# Patient Record
Sex: Male | Born: 1955 | Race: White | Hispanic: No | Smoking: Never smoker
Health system: Southern US, Community
[De-identification: ages and names within clinical notes are randomized; demographics above are authoritative.]

## PROBLEM LIST (undated history)

## (undated) DIAGNOSIS — F419 Anxiety disorder, unspecified: Secondary | ICD-10-CM

## (undated) DIAGNOSIS — F32A Depression, unspecified: Secondary | ICD-10-CM

## (undated) DIAGNOSIS — Z86718 Personal history of other venous thrombosis and embolism: Secondary | ICD-10-CM

## (undated) DIAGNOSIS — G4730 Sleep apnea, unspecified: Secondary | ICD-10-CM

## (undated) DIAGNOSIS — I10 Essential (primary) hypertension: Secondary | ICD-10-CM

## (undated) HISTORY — PX: EYE SURGERY: SHX253

## (undated) HISTORY — PX: SKIN CANCER EXCISION: SHX779

---

## 2000-12-14 DIAGNOSIS — E785 Hyperlipidemia, unspecified: Secondary | ICD-10-CM | POA: Insufficient documentation

## 2008-11-03 DIAGNOSIS — G4730 Sleep apnea, unspecified: Secondary | ICD-10-CM | POA: Insufficient documentation

## 2008-11-03 DIAGNOSIS — E669 Obesity, unspecified: Secondary | ICD-10-CM | POA: Insufficient documentation

## 2008-11-03 DIAGNOSIS — R5383 Other fatigue: Secondary | ICD-10-CM | POA: Insufficient documentation

## 2008-11-03 DIAGNOSIS — L570 Actinic keratosis: Secondary | ICD-10-CM | POA: Insufficient documentation

## 2008-11-03 DIAGNOSIS — I868 Varicose veins of other specified sites: Secondary | ICD-10-CM | POA: Insufficient documentation

## 2008-11-03 DIAGNOSIS — R600 Localized edema: Secondary | ICD-10-CM | POA: Insufficient documentation

## 2008-11-03 DIAGNOSIS — I10 Essential (primary) hypertension: Secondary | ICD-10-CM | POA: Insufficient documentation

## 2009-04-14 DIAGNOSIS — G4700 Insomnia, unspecified: Secondary | ICD-10-CM | POA: Insufficient documentation

## 2009-04-14 DIAGNOSIS — F32A Depression, unspecified: Secondary | ICD-10-CM | POA: Insufficient documentation

## 2015-05-14 DIAGNOSIS — R072 Precordial pain: Secondary | ICD-10-CM | POA: Insufficient documentation

## 2019-08-21 ENCOUNTER — Encounter (HOSPITAL_COMMUNITY): Payer: Self-pay | Admitting: *Deleted

## 2019-08-21 ENCOUNTER — Inpatient Hospital Stay (HOSPITAL_COMMUNITY)
Admission: AD | Admit: 2019-08-21 | Discharge: 2019-08-25 | DRG: 885 | Disposition: A | Discharge status: Home / Self Care | Payer: 59 | Attending: Psychiatry | Admitting: Psychiatry

## 2019-08-21 ENCOUNTER — Other Ambulatory Visit: Payer: Self-pay | Admitting: Behavioral Health

## 2019-08-21 ENCOUNTER — Other Ambulatory Visit: Payer: Self-pay

## 2019-08-21 DIAGNOSIS — F333 Major depressive disorder, recurrent, severe with psychotic symptoms: Secondary | ICD-10-CM

## 2019-08-21 DIAGNOSIS — F3341 Major depressive disorder, recurrent, in partial remission: Secondary | ICD-10-CM | POA: Diagnosis present

## 2019-08-21 DIAGNOSIS — H9319 Tinnitus, unspecified ear: Secondary | ICD-10-CM | POA: Diagnosis present

## 2019-08-21 DIAGNOSIS — I10 Essential (primary) hypertension: Secondary | ICD-10-CM | POA: Diagnosis present

## 2019-08-21 DIAGNOSIS — R45851 Suicidal ideations: Secondary | ICD-10-CM | POA: Diagnosis present

## 2019-08-21 DIAGNOSIS — Z23 Encounter for immunization: Secondary | ICD-10-CM

## 2019-08-21 DIAGNOSIS — G4700 Insomnia, unspecified: Secondary | ICD-10-CM | POA: Diagnosis present

## 2019-08-21 DIAGNOSIS — Z20828 Contact with and (suspected) exposure to other viral communicable diseases: Secondary | ICD-10-CM | POA: Diagnosis present

## 2019-08-21 DIAGNOSIS — F331 Major depressive disorder, recurrent, moderate: Secondary | ICD-10-CM | POA: Diagnosis present

## 2019-08-21 DIAGNOSIS — F411 Generalized anxiety disorder: Secondary | ICD-10-CM | POA: Diagnosis present

## 2019-08-21 HISTORY — DX: Essential (primary) hypertension: I10

## 2019-08-21 LAB — SARS CORONAVIRUS 2 BY RT PCR (HOSPITAL ORDER, PERFORMED IN ~~LOC~~ HOSPITAL LAB): SARS Coronavirus 2: NEGATIVE

## 2019-08-21 MED ORDER — ACETAMINOPHEN 325 MG PO TABS
650.0000 mg | ORAL_TABLET | Freq: Four times a day (QID) | ORAL | Status: DC | PRN
  Administered 2019-08-21: 650 mg via ORAL
  Filled 2019-08-21: qty 2

## 2019-08-21 MED ORDER — TRAZODONE HCL 50 MG PO TABS
50.0000 mg | ORAL_TABLET | Freq: Every day | ORAL | Status: DC
  Administered 2019-08-21: 50 mg via ORAL
  Filled 2019-08-21 (×4): qty 1

## 2019-08-21 MED ORDER — ALUM & MAG HYDROXIDE-SIMETH 200-200-20 MG/5ML PO SUSP: 30.0000 mL | ORAL | Status: DC | PRN

## 2019-08-21 MED ORDER — PNEUMOCOCCAL VAC POLYVALENT 25 MCG/0.5ML IJ INJ
0.5000 mL | INJECTION | INTRAMUSCULAR | Status: AC
  Administered 2019-08-22: 0.5 mL via INTRAMUSCULAR

## 2019-08-21 NOTE — H&P (Signed)
Behavioral Health Medical Screening Exam  Kyle Mccarthy is an 63 y.o. male.who presented to [facility] as a walk-in, voluntarily, accompanied alone. He endorses worsening depression along with ongoing suicidal thoughts.  He describes a plan to, " overdose or kill myself by carbon monoxide." He states, " when you feel like all hope is gone, you don't matter for nothing, you feel emotionally numb, and every other thought is about the world being better without you, what do you do?" He reports he has been dealing with suicidal thoughts off and on for the past year although for the past month, his suicidal thoughts have worsened. When asked about triggers or stressors he replied, " its really hard to say." He reports he was diagnosed with depression a few weeks ago by his PCP.  He describes current depressive symptoms as hopelessness, worthlessness, isolation, emptiness, decreased sleep ("5 hours per night at best"), and decreased appetite. Reports anxiety described as excessive worry. Reports he was started on Citalopram by his PCP for depression although he stopped taking it a week and a half later due to side effects. Reports for the past 3 weeks, he has been drinking alcohol excessively (mostly beer but some liquor) although he had no prior history of alcohol use. He denies AVH or other psychosis as well as homicidals ideations.   In regard to additional  psychiatric history, the reports 3 prior suicide attempts with the most recent one a few weekends ago. He states, " I was out pushing the mower very fast and the entire time I was hoping to meet my widow maker." When asked to explain he stated, " a widow maker is when your heart stops or is cut off from the back and you just drop dead." He also reports he has attempted to kill himself by finagling carbon monoxide. He reports  he recently made a will, prepaid for funeral expenses, signed his truck title over to his son and gave his son his debit cards for  preparation of his death.  He denies previous inpatient or outpatient psychiatric care. He reports a significant family history of mental health illness on maternal and paternal side and alcoholism on maternal side. Reports his son has been psychiatrically hospitalized here at [facility].    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 Reports medical history as HTN and hypercholesterolemia.   Total Time spent with patient: 20 minutes  Psychiatric Specialty Exam: Physical Exam  Vitals reviewed. Constitutional: He is oriented to person, place, and time.  Neurological: He is alert and oriented to person, place, and time.    Review of Systems  Psychiatric/Behavioral: Positive for depression and suicidal ideas. Negative for hallucinations, memory loss and substance abuse. The patient is nervous/anxious and has insomnia.   All other systems reviewed and are negative.   Blood pressure 140/89, pulse 72, temperature 98.3 F (36.8 C), temperature source Oral, resp. rate 18, SpO2 100 %.There is no height or weight on file to calculate BMI.  General Appearance: Fairly Groomed  Eye Contact:  Good  Speech:  Clear and Coherent and Normal Rate  Volume:  Normal  Mood:  Anxious, Depressed, Hopeless and Worthless  Affect:  Depressed  Thought Process:  Coherent, Linear and Descriptions of Associations: Intact  Orientation:  Full (Time, Place, and Person)  Thought Content:  WDL  Suicidal Thoughts:  Yes.  with intent/plan  Homicidal Thoughts:  No  Memory:  Immediate;   Fair Recent;   Fair  Judgement:  Fair  Insight:  Fair  Psychomotor Activity:  Normal  Concentration: Concentration: Fair and Attention Span: Fair  Recall:  FiservFair  Fund of Knowledge:Fair  Language: Good  Akathisia:  Negative  Handed:  Right  AIMS (if indicated):     Assets:  Communication Skills Desire for Improvement Resilience Social Support  Sleep:       Musculoskeletal: Strength & Muscle Tone: within normal  limits Gait & Station: normal Patient leans: N/A  Blood pressure 140/89, pulse 72, temperature 98.3 F (36.8 C), temperature source Oral, resp. rate 18, SpO2 100 %.  Recommendations:  There is evidence of imminent risk to self at present.   Patient does meet criteria for psychiatric inpatient admission.     Denzil MagnusonLaShunda Adonias Demore, NP 08/21/2019, 10:36 AM

## 2019-08-21 NOTE — Progress Notes (Signed)
Patient ID: Kyle Mccarthy, male   DOB: 17-Dec-1956, 63 y.o.   MRN: [id number] Patient reported a decreased desire to live with worsening depression.   Patient stated he knew it was time for him to come get some help. Skin assessment complete.  Patient found to be free of all injury upon admission.  Patient admitted to the hall without incident.

## 2019-08-21 NOTE — BH Assessment (Signed)
Assessment Note  Kyle Mccarthy is an 63 y.o. male. Pt reports SI with multiple plans. Pt denies HI and AVH. Pt states he has been depressed most of his life and had SI throughout his life. Pt reports helplessness, hopelessness, and feeling as if no one cares. Pt reports 3 prior SI attempts. Pt denies previous or current MH treatment.   Tinnie Gens, NP recommends inpatient treatment. Pt accepted to [facility] unit per Nira Conn, RN.   Diagnosis:  F33.2 MDD  Past Medical History: No past medical history on file.   Family History: No family history on file.  Social History:  has no history on file for tobacco, alcohol, and drug.  Additional Social History:  Alcohol / Drug Use Pain Medications: please see mar Prescriptions: please see mar Over the Counter: please see mar History of alcohol / drug use?: No history of alcohol / drug abuse Longest period of sobriety (when/how long): unknown  CIWA: CIWA-Ar BP: 140/89 Pulse Rate: 72 COWS:    Allergies: Not on File  Home Medications: (Not in a hospital admission)   OB/GYN Status:  No LMP for male patient.  General Assessment Data TTS Assessment: In system Is this a Tele or Face-to-Face Assessment?: Face-to-Face Is this an Initial Assessment or a Re-assessment for this encounter?: Initial Assessment Patient Accompanied by:: N/A Language Other than English: No Living Arrangements: Other (Comment) What gender do you identify as?: Male Marital status: Single Maiden name: NA Pregnancy Status: No Living Arrangements: Alone Can pt return to current living arrangement?: Yes Admission Status: Voluntary Is patient capable of signing voluntary admission?: Yes Referral Source: Self/Family/Friend Insurance type: Nutritional therapist Exam ([place]) Medical Exam completed: Yes  Crisis Care Plan Living Arrangements: Alone Legal Guardian: Other:(self) Name of Psychiatrist: NA Name of Therapist: NA  Education Status Is  patient currently in school?: No Is the patient employed, unemployed or receiving disability?: Employed  Risk to self with the past 6 months Suicidal Ideation: Yes-Currently Present Has patient been a risk to self within the past 6 months prior to admission? : Yes Suicidal Intent: Yes-Currently Present Has patient had any suicidal intent within the past 6 months prior to admission? : Yes Is patient at risk for suicide?: Yes Suicidal Plan?: Yes-Currently Present Has patient had any suicidal plan within the past 6 months prior to admission? : Yes Specify Current Suicidal Plan: reports multiple Access to Means: Yes Specify Access to Suicidal Means: access to multiple What has been your use of drugs/alcohol within the last 12 months?: SA Previous Attempts/Gestures: Yes How many times?: 3 Other Self Harm Risks: NA Triggers for Past Attempts: None known Intentional Self Injurious Behavior: None Family Suicide History: No Recent stressful life event(s): Other (Comment)(MH) Persecutory voices/beliefs?: No Depression: Yes Depression Symptoms: Tearfulness, Isolating, Fatigue, Loss of interest in usual pleasures, Feeling worthless/self pity, Feeling angry/irritable, Guilt, Insomnia, Despondent Substance abuse history and/or treatment for substance abuse?: No Suicide prevention information given to non-admitted patients: Not applicable  Risk to Others within the past 6 months Homicidal Ideation: No Does patient have any lifetime risk of violence toward others beyond the six months prior to admission? : No Thoughts of Harm to Others: No Current Homicidal Intent: No Current Homicidal Plan: No Access to Homicidal Means: No Identified Victim: NA History of harm to others?: No Assessment of Violence: None Noted Violent Behavior Description: NA Does patient have access to weapons?: No Criminal Charges Pending?: No Does patient have a court date: No Is patient on  probation?:  No  Psychosis Hallucinations: None noted Delusions: None noted  Mental Status Report Appearance/Hygiene: Unremarkable Eye Contact: Fair Motor Activity: Freedom of movement Speech: Logical/coherent Level of Consciousness: Alert Mood: Depressed Affect: Depressed Anxiety Level: Minimal Thought Processes: Coherent, Relevant Judgement: Unimpaired Orientation: Person, Place, Time, Situation Obsessive Compulsive Thoughts/Behaviors: None  Cognitive Functioning Concentration: Normal Memory: Recent Intact, Remote Intact Is patient IDD: No Insight: Poor Impulse Control: Poor Appetite: Fair Have you had any weight changes? : No Change Sleep: Decreased Total Hours of Sleep: 5 Vegetative Symptoms: None  ADLScreening [facility] Assessment Services) Patient's cognitive ability adequate to safely complete daily activities?: Yes Patient able to express need for assistance with ADLs?: Yes  Prior Inpatient Therapy Prior Inpatient Therapy: No  Prior Outpatient Therapy Prior Outpatient Therapy: No Does patient have an ACCT team?: No Does patient have Intensive In-House Services?  : No Does patient have [facility] services? : No Does patient have P4CC services?: No  ADL Screening (condition at time of admission) Patient's cognitive ability adequate to safely complete daily activities?: Yes Is the patient deaf or have difficulty hearing?: No Does the patient have difficulty seeing, even when wearing glasses/contacts?: No Does the patient have difficulty concentrating, remembering, or making decisions?: No Patient able to express need for assistance with ADLs?: Yes Does the patient have difficulty dressing or bathing?: No       Abuse/Neglect Assessment (Assessment to be complete while patient is alone) Abuse/Neglect Assessment Can Be Completed: Yes Physical Abuse: Denies Verbal Abuse: Denies Sexual Abuse: Denies Exploitation of patient/patient's resources: Denies     Merchant navy officerAdvance Directives  (For Healthcare) Does Patient Have a Medical Advance Directive?: No Would patient like information on creating a medical advance directive?: No - Patient declined          Disposition:  Disposition Initial Assessment Completed for this Encounter: Yes Disposition of Patient: Admit  On Site Evaluation by:   Reviewed with Physician:    Emmit PomfretLevette,Kimberle Stanfill D 08/21/2019 12:31 PM

## 2019-08-21 NOTE — Tx Team (Signed)
Initial Treatment Plan 08/21/2019 3:17 PM Kyle Mccarthy ENM:[id number]    PATIENT STRESSORS: Marital or family conflict   PATIENT STRENGTHS: Ability for insight Active sense of humor Average or above average intelligence Capable of independent living Communication skills [place] of knowledge Motivation for treatment/growth Physical Health Supportive family/friends Work skills   PATIENT IDENTIFIED PROBLEMS: "I just dont want to live anymore                     DISCHARGE CRITERIA:  Improved stabilization in mood, thinking, and/or behavior  PRELIMINARY DISCHARGE PLAN: Return to previous living arrangement  PATIENT/FAMILY INVOLVEMENT: This treatment plan has been presented to and reviewed with the patient, Kyle Mccarthy, and/or family member,   The patient and family have been given the opportunity to ask questions and make suggestions.  Clarita Crane, RN 08/21/2019, 3:17 PM

## 2019-08-22 DIAGNOSIS — F333 Major depressive disorder, recurrent, severe with psychotic symptoms: Principal | ICD-10-CM

## 2019-08-22 LAB — COMPREHENSIVE METABOLIC PANEL
ALT: 11 U/L (ref 0–44)
AST: 13 U/L — ABNORMAL LOW (ref 15–41)
Albumin: 3.8 g/dL (ref 3.5–5.0)
Alkaline Phosphatase: 51 U/L (ref 38–126)
Anion gap: 9 (ref 5–15)
BUN: 11 mg/dL (ref 8–23)
CO2: 28 mmol/L (ref 22–32)
Calcium: 9.4 mg/dL (ref 8.9–10.3)
Chloride: 107 mmol/L (ref 98–111)
Creatinine, Ser: 1.12 mg/dL (ref 0.61–1.24)
GFR calc Af Amer: >60 mL/min (ref >60)
GFR calc non Af Amer: >60 mL/min (ref >60)
Glucose, Bld: 89 mg/dL (ref 70–99)
Potassium: 3.4 mmol/L — ABNORMAL LOW (ref 3.5–5.1)
Sodium: 144 mmol/L (ref 135–145)
Total Bilirubin: 0.8 mg/dL (ref 0.3–1.2)
Total Protein: 6.3 g/dL — ABNORMAL LOW (ref 6.5–8.1)

## 2019-08-22 LAB — TSH: TSH: 1.679 u[IU]/mL (ref 0.350–4.500)

## 2019-08-22 LAB — LIPID PANEL
Cholesterol: 136 mg/dL (ref 0–200)
HDL: 48 mg/dL (ref >40)
LDL Cholesterol: 68 mg/dL (ref 0–99)
Total CHOL/HDL Ratio: 2.8 RATIO
Triglycerides: 98 mg/dL (ref <150)
VLDL: 20 mg/dL (ref 0–40)

## 2019-08-22 LAB — CBC
HCT: 41.4 % (ref 39.0–52.0)
Hemoglobin: 13.5 g/dL (ref 13.0–17.0)
MCH: 31.6 pg (ref 26.0–34.0)
MCHC: 32.6 g/dL (ref 30.0–36.0)
MCV: 97 fL (ref 80.0–100.0)
Platelets: 133 10*3/uL — ABNORMAL LOW (ref 150–400)
RBC: 4.27 MIL/uL (ref 4.22–5.81)
RDW: 12.1 % (ref 11.5–15.5)
WBC: 4.5 10*3/uL (ref 4.0–10.5)
nRBC: 0 % (ref 0.0–0.2)

## 2019-08-22 LAB — RAPID URINE DRUG SCREEN, HOSP PERFORMED
Amphetamines: NOT DETECTED
Barbiturates: NOT DETECTED
Benzodiazepines: NOT DETECTED
Cocaine: NOT DETECTED
Opiates: NOT DETECTED
Tetrahydrocannabinol: NOT DETECTED

## 2019-08-22 LAB — ETHANOL: Alcohol, Ethyl (B): <10 mg/dL (ref <10)

## 2019-08-22 LAB — HEMOGLOBIN A1C
Hgb A1c MFr Bld: 5.3 % (ref 4.8–5.6)
Mean Plasma Glucose: 105.41 mg/dL

## 2019-08-22 MED ORDER — LISINOPRIL 40 MG PO TABS
40.0000 mg | ORAL_TABLET | Freq: Every day | ORAL | Status: DC
  Administered 2019-08-23 – 2019-08-25 (×3): 40 mg via ORAL
  Filled 2019-08-22: qty 2
  Filled 2019-08-22 (×5): qty 1

## 2019-08-22 MED ORDER — ENSURE ENLIVE PO LIQD
237.0000 mL | ORAL | Status: DC
  Administered 2019-08-24: 237 mL via ORAL

## 2019-08-22 MED ORDER — ADULT MULTIVITAMIN W/MINERALS CH
1.0000 | ORAL_TABLET | Freq: Every day | ORAL | Status: DC
  Administered 2019-08-22 – 2019-08-25 (×4): 1 via ORAL
  Filled 2019-08-22 (×6): qty 1

## 2019-08-22 MED ORDER — SERTRALINE HCL 50 MG PO TABS
50.0000 mg | ORAL_TABLET | Freq: Every day | ORAL | Status: DC
  Administered 2019-08-22 – 2019-08-25 (×4): 50 mg via ORAL
  Filled 2019-08-22 (×6): qty 1

## 2019-08-22 MED ORDER — SIMVASTATIN 20 MG PO TABS
20.0000 mg | ORAL_TABLET | Freq: Every day | ORAL | Status: DC
  Administered 2019-08-22 – 2019-08-24 (×3): 20 mg via ORAL
  Filled 2019-08-22 (×5): qty 1

## 2019-08-22 MED ORDER — TRAZODONE HCL 50 MG PO TABS
50.0000 mg | ORAL_TABLET | Freq: Every evening | ORAL | Status: DC | PRN
  Filled 2019-08-22: qty 1

## 2019-08-22 NOTE — [facility] Group Notes (Signed)
[facility] Notes:  (Nursing/MHT/Case Management/Adjunct)  Date:  08/22/2019  Time:  10:00 AM  Type of Therapy:  Nurse Education  Participation Level:  Active  Participation Quality:  Appropriate, Attentive, Sharing and Supportive  Affect:  Appropriate  Cognitive:  Alert and Appropriate  Insight:  Appropriate, Good and Improving  Engagement in Group:  Developing/Improving, Engaged, Improving and Supportive  Modes of Intervention:  Discussion, Education, Exploration, Socialization and Support  Summary of Progress/Problems: pt's were asked to discuss crisis present and past. Pt's were guided through triggers or events they feel lead to the need for crisis management. Pt's stated their goal for today regarding crisis management, how this goal would help decrease future crisis, and how they were going to achieve this. Lastly, Pt's discussed alternative interventions they could use instead of negative coping strategies currently being utilized.  Pt was appropriate and shared multiple times. Pt did have sidebar conversations once, but was easily redirected.   Otelia Limes Aidaly Cordner 08/22/2019, 12:34 PM

## 2019-08-22 NOTE — Progress Notes (Signed)
NUTRITION ASSESSMENT RD working remotely.   Pt identified as at risk on the Malnutrition Screen Tool  INTERVENTION: - will order Ensure Enlive once/day, each supplement provides 350 kcal and 20 grams of protein. - will order daily multivitamin with minerals. - continue to encourage PO intakes.    NUTRITION DIAGNOSIS: Unintentional weight loss related to sub-optimal intake as evidenced by pt report.   Goal: Pt to meet >/= 90% of their estimated nutrition needs.  Monitor:  PO intake  Assessment:  Patient admitted for worsening depression and SI with a plan to OD or kill himself via carbon monoxide. Patient reported feeling hopeless, emotionally numb, worthlessness, isolation, decreased sleep, and decreased appetite. He reported having passive SI for the past 1 year but that it has worsened in the past 1 month.   Per chart review, current weight is 215 lb and no other weight hx information is available in the chart.    63 y.o. male  Height: Ht Readings from Last 1 Encounters:  08/21/19 5\' 10"  (1.778 m)    Weight: Wt Readings from Last 1 Encounters:  08/21/19 97.5 kg    Weight Hx: Wt Readings from Last 10 Encounters:  08/21/19 97.5 kg    BMI:  Body mass index is 30.85 kg/m. Pt meets criteria for obesity based on current BMI.  Estimated Nutritional Needs: Kcal: 25-30 kcal/kg Protein: > 1 gram protein/kg Fluid: 1 ml/kcal  Diet Order:  Diet Order            Diet regular Room service appropriate? No; Fluid consistency: Thin; Fluid restriction: 2000 mL Fluid  Diet effective now             Pt is also offered choice of unit snacks mid-morning and mid-afternoon.  Pt is eating as desired.   Lab results and medications reviewed.      Jarome Matin, MS, RD, LDN, [facility] Inpatient Clinical Dietitian Pager # [phone] After hours/weekend pager # [phone]

## 2019-08-22 NOTE — H&P (Addendum)
Psychiatric Admission Assessment Adult  Patient Identification: Kyle Mccarthy  MRN:  [id number]  Date of Evaluation:  08/22/2019  Chief Complaint: Worsening symptoms of depression with psychosis.  Principal Diagnosis: Severe recurrent major depressive disorder with psychotic features ([place])  Diagnosis:  Principal Problem:   Severe recurrent major depressive disorder with psychotic features ([place]) Active Problems:   MDD (major depressive disorder)  History of Present Illness: This is the first psychiatric admission assessment for this 63 year old Caucasian male with prior hx of major depressive disorder. He came to the [facility] as a walk-in with complaints of worsening symptoms of anxiety & depression of 3 weeks. He is seeking mood stabilization treatment & outpatient psychiatric resources for medication management & routine psychiatric care.  During this assessment, "Kyle Mccarthy" reports, "I came here for help. My depression & anxiety are getting out of control, on & off x 1 year. I have came to a point whereby I'm finding myself going deeper & deeper into this dark hole. I cannot really be specific. However, I felt like life does not really matter any more. About a year & half ago when I was  alone a lot. I found myself going down into this dark part that I do not recognize & yet did not show any concern. This got worse about 3 weeks ago. My son visited me around that time, saw how I was, got very concerned & moved in with for my own safety. In the distant past, I have tried to kill myself by carbon-monoxide poisoning. That did not work, but it gave me a real bad headache. I had tried to take medicine for my depression in the past. It was called Citalopram. This medicine rather gave me bad side effects comprised of hot flashes, bad insomnia, restlessness & mood instability. I have stopped this medicine. The last time I took this medicine was 3 days ago. I have never been a drinker, knowing what alcohol did  to my uncle. He drank himself to death. But, I found myself at age 54, first time going to a liquor store, bought Apple-Brandy & I have consumed a lot of it to get the edge off of me. I have started to hallucinate, seeing a lot of things that were not there e.g; a big truck lying across the road, but when I slammed on my break, there was nothing on the road. I have seen my grandson walking on the side of a highway in [place], which was not real etc. I need a good medicine to help me. This medicine cannot make me sleepy, alter my judgement or affect my alertness in any way".  Associated Signs/Symptoms:  Depression Symptoms:  depressed mood, insomnia, feelings of worthlessness/guilt, hopelessness, anxiety,  (Hypo) Manic Symptoms:  Irritable Mood, Labiality of Mood,  Anxiety Symptoms:  Excessive Worry,  Psychotic Symptoms:  Hallucinations: Visual  PTSD Symptoms: Had a traumatic exposure:  2 friends recently completed suicide. Patient is feeling a lot of guilt.  Total Time spent with patient: 1 hour  Past Psychiatric History: Major depression  Is the patient at risk to self? Yes.    Has the patient been a risk to self in the past 6 months? Yes.    Has the patient been a risk to self within the distant past? Yes.    Is the patient a risk to others? No.  Has the patient been a risk to others in the past 6 months? No.  Has the patient been a  risk to others within the distant past? No.   Prior Inpatient Therapy: Prior Inpatient Therapy: No Prior Outpatient Therapy: Prior Outpatient Therapy: No Does patient have an ACCT team?: No Does patient have Intensive In-House Services?  : No Does patient have [facility] services? : No Does patient have P4CC services?: No  Alcohol Screening: 1. How often do you have a drink containing alcohol?: Monthly or less 2. How many drinks containing alcohol do you have on a typical day when you are drinking?: 1 or 2 3. How often do you have six or more  drinks on one occasion?: Never AUDIT-C Score: 1 4. How often during the last year have you found that you were not able to stop drinking once you had started?: Never 5. How often during the last year have you failed to do what was normally expected from you becasue of drinking?: Never 6. How often during the last year have you needed a first drink in the morning to get yourself going after a heavy drinking session?: Never 7. How often during the last year have you had a feeling of guilt of remorse after drinking?: Never 8. How often during the last year have you been unable to remember what happened the night before because you had been drinking?: Never 9. Have you or someone else been injured as a result of your drinking?: No 10. Has a relative or friend or a doctor or another health worker been concerned about your drinking or suggested you cut down?: No Alcohol Use Disorder Identification Test Final Score (AUDIT): 1  Substance Abuse History in the last 12 months:  No.  Consequences of Substance Abuse: NA  Previous Psychotropic Medications: Yes   Psychological Evaluations: No   Past Medical History:  Past Medical History:  Diagnosis Date  . Hypertension     Past Surgical History:  Procedure Laterality Date  . EYE SURGERY     cateract surgery 20 yrs ago   Family History: History reviewed. No pertinent family history.  Family Psychiatric  History: Mood instability runs on both sides of the family,  Tobacco Screening:   Social History:  Social History   Substance and Sexual Activity  Alcohol Use Yes  . Alcohol/week: 2.0 standard drinks  . Types: 2 Cans of beer per week   Comment: occassional     Social History   Substance and Sexual Activity  Drug Use Never    Additional Social History: Marital status: Divorced Divorced, when?: 15 years ago What types of issues is patient dealing with in the relationship?: Reports he and his ex-wife did not agree on lifestyle and  parenting styles. Are you sexually active?: No What is your sexual orientation?: Heterosexual Has your sexual activity been affected by drugs, alcohol, medication, or emotional stress?: No Does patient have children?: Yes How many children?: 3 How is patient's relationship with their children?: Reports having a good relationship with two of his three adult children.    Pain Medications: please see mar Prescriptions: please see mar Over the Counter: please see mar History of alcohol / drug use?: No history of alcohol / drug abuse Longest period of sobriety (when/how long): unknown  Allergies:  No Known Allergies  Lab Results:  Results for orders placed or performed during the hospital encounter of 08/21/19 (from the past 48 hour(s))  SARS Coronavirus 2 [facility] order, Performed in [place] hospital lab)     Status: None   Collection Time: 08/21/19 10:58 AM  Result Value Ref  Range   SARS Coronavirus 2 NEGATIVE NEGATIVE    Comment: (NOTE) If result is NEGATIVE SARS-CoV-2 target nucleic acids are NOT DETECTED. The SARS-CoV-2 RNA is generally detectable in upper and lower  respiratory specimens during the acute phase of infection. The lowest  concentration of SARS-CoV-2 viral copies this assay can detect is 250  copies / mL. A negative result does not preclude SARS-CoV-2 infection  and should not be used as the sole basis for treatment or other  patient management decisions.  A negative result may occur with  improper specimen collection / handling, submission of specimen other  than nasopharyngeal swab, presence of viral mutation(s) within the  areas targeted by this assay, and inadequate number of viral copies  (<250 copies / mL). A negative result must be combined with clinical  observations, patient history, and epidemiological information. If result is POSITIVE SARS-CoV-2 target nucleic acids are DETECTED. The SARS-CoV-2 RNA is generally detectable in upper and lower   respiratory specimens dur ing the acute phase of infection.  Positive  results are indicative of active infection with SARS-CoV-2.  Clinical  correlation with patient history and other diagnostic information is  necessary to determine patient infection status.  Positive results do  not rule out bacterial infection or co-infection with other viruses. If result is PRESUMPTIVE POSTIVE SARS-CoV-2 nucleic acids MAY BE PRESENT.   A presumptive positive result was obtained on the submitted specimen  and confirmed on repeat testing.  While 2019 novel coronavirus  (SARS-CoV-2) nucleic acids may be present in the submitted sample  additional confirmatory testing may be necessary for epidemiological  and / or clinical management purposes  to differentiate between  SARS-CoV-2 and other Sarbecovirus currently known to infect humans.  If clinically indicated additional testing with an alternate test  methodology [phone]) is advised. The SARS-CoV-2 RNA is generally  detectable in upper and lower respiratory sp ecimens during the acute  phase of infection. The expected result is Negative. Fact Sheet for Patients:  StrictlyIdeas.no Fact Sheet for Healthcare Providers: BankingDealers.co.za This test is not yet approved or cleared by the [place] FDA and has been authorized for detection and/or diagnosis of SARS-CoV-2 by FDA under an Emergency Use Authorization (EUA).  This EUA will remain in effect (meaning this test can be used) for the duration of the COVID-19 declaration under Section 564(b)(1) of the Act, 21 U.S.C. section 360bbb-3(b)(1), unless the authorization is terminated or revoked sooner. Performed at [facility], [place] [address], [place],  [address]   Urine rapid drug screen (hosp performed)not at [facility]     Status: None   Collection Time: 08/21/19  5:42 PM  Result Value Ref Range   Opiates NONE DETECTED NONE  DETECTED   Cocaine NONE DETECTED NONE DETECTED   Benzodiazepines NONE DETECTED NONE DETECTED   Amphetamines NONE DETECTED NONE DETECTED   Tetrahydrocannabinol NONE DETECTED NONE DETECTED   Barbiturates NONE DETECTED NONE DETECTED    Comment: (NOTE) DRUG SCREEN FOR MEDICAL PURPOSES ONLY.  IF CONFIRMATION IS NEEDED FOR ANY PURPOSE, NOTIFY LAB WITHIN 5 DAYS. LOWEST DETECTABLE LIMITS FOR URINE DRUG SCREEN Drug Class                     Cutoff (ng/mL) Amphetamine and metabolites    1000 Barbiturate and metabolites    200 Benzodiazepine                 588 Tricyclics and metabolites     300 Opiates  and metabolites        300 Cocaine and metabolites        300 THC                            50 Performed at [facility], [place] [address], [place], [place] [address]   CBC     Status: Abnormal   Collection Time: 08/22/19  6:29 AM  Result Value Ref Range   WBC 4.5 4.0 - 10.5 K/uL   RBC 4.27 4.22 - 5.81 MIL/uL   Hemoglobin 13.5 13.0 - 17.0 g/dL   HCT 41.4 39.0 - 52.0 %   MCV 97.0 80.0 - 100.0 fL   MCH 31.6 26.0 - 34.0 pg   MCHC 32.6 30.0 - 36.0 g/dL   RDW 12.1 11.5 - 15.5 %   Platelets 133 (L) 150 - 400 K/uL   nRBC 0.0 0.0 - 0.2 %    Comment: Performed at [facility], [place] [address] [place], [place] [address]  Comprehensive metabolic panel     Status: Abnormal   Collection Time: 08/22/19  6:29 AM  Result Value Ref Range   Sodium 144 135 - 145 mmol/L   Potassium 3.4 (L) 3.5 - 5.1 mmol/L   Chloride 107 98 - 111 mmol/L   CO2 28 22 - 32 mmol/L   Glucose, Bld 89 70 - 99 mg/dL   BUN 11 8 - 23 mg/dL   Creatinine, Ser 1.12 0.61 - 1.24 mg/dL   Calcium 9.4 8.9 - 10.3 mg/dL   Total Protein 6.3 (L) 6.5 - 8.1 g/dL   Albumin 3.8 3.5 - 5.0 g/dL   AST 13 (L) 15 - 41 U/L   ALT 11 0 - 44 U/L   Alkaline Phosphatase 51 38 - 126 U/L   Total Bilirubin 0.8 0.3 - 1.2 mg/dL   GFR calc non Af Amer >60 >60 mL/min   GFR calc Af Amer >60 >60 mL/min   Anion gap 9  5 - 15    Comment: Performed at [facility], [place] [address], [place], [place] [address]  Ethanol     Status: None   Collection Time: 08/22/19  6:29 AM  Result Value Ref Range   Alcohol, Ethyl (B) <10 <10 mg/dL    Comment: (NOTE) Lowest detectable limit for serum alcohol is 10 mg/dL. For medical purposes only. Performed at [facility] [place] [address] [place], [place] [address]   Hemoglobin A1c     Status: None   Collection Time: 08/22/19  6:29 AM  Result Value Ref Range   Hgb A1c MFr Bld 5.3 4.8 - 5.6 %    Comment: (NOTE) Pre diabetes:          5.7%-6.4% Diabetes:              >6.4% Glycemic control for   <7.0% adults with diabetes    Mean Plasma Glucose 105.41 mg/dL    Comment: Performed at [place] [address], [place],  [address]  Lipid panel     Status: None   Collection Time: 08/22/19  6:29 AM  Result Value Ref Range   Cholesterol 136 0 - 200 mg/dL   Triglycerides 98 <150 mg/dL   HDL 48 >40 mg/dL   Total CHOL/HDL Ratio 2.8 RATIO   VLDL 20 0 - 40 mg/dL   LDL Cholesterol 68 0 - 99 mg/dL  Comment:        Total Cholesterol/HDL:CHD Risk Coronary Heart Disease Risk Table                     Men   Women  1/2 Average Risk   3.4   3.3  Average Risk       5.0   4.4  2 X Average Risk   9.6   7.1  3 X Average Risk  23.4   11.0        Use the calculated Patient Ratio above and the CHD Risk Table to determine the patient's CHD Risk.        ATP III CLASSIFICATION (LDL):  <100     mg/dL   Optimal  100-129  mg/dL   Near or Above                    Optimal  130-159  mg/dL   Borderline  160-189  mg/dL   High  >190     mg/dL   Very High Performed at [place] [address] [place], [place] [address]   TSH     Status: None   Collection Time: 08/22/19  6:29 AM  Result Value Ref Range   TSH 1.679 0.350 - 4.500 uIU/mL    Comment: Performed by a 3rd Generation assay with a functional  sensitivity of <=0.01 uIU/mL. Performed at [facility], [place] [address] [place], [place] [address]    Blood Alcohol level:  Lab Results  Component Value Date   ETH <10 29/51/8841    Metabolic Disorder Labs:  Lab Results  Component Value Date   HGBA1C 5.3 08/22/2019   MPG 105.41 08/22/2019   No results found for: PROLACTIN Lab Results  Component Value Date   CHOL 136 08/22/2019   TRIG 98 08/22/2019   HDL 48 08/22/2019   CHOLHDL 2.8 08/22/2019   VLDL 20 08/22/2019   LDLCALC 68 08/22/2019   Current Medications: Current Facility-Administered Medications  Medication Dose Route Frequency Provider Last Rate Last Dose  . acetaminophen (TYLENOL) tablet 650 mg  650 mg Oral Q6H PRN Mordecai Maes, NP   650 mg at 08/21/19 2228  . alum & mag hydroxide-simeth (MAALOX/MYLANTA) 200-200-20 MG/5ML suspension 30 mL  30 mL Oral Q4H PRN Mordecai Maes, NP      . feeding supplement (ENSURE ENLIVE) (ENSURE ENLIVE) liquid 237 mL  237 mL Oral Q24H Cobos, Myer Peer, MD      . multivitamin with minerals tablet 1 tablet  1 tablet Oral Daily Cobos, Myer Peer, MD   1 tablet at 08/22/19 0910  . traZODone (DESYREL) tablet 50 mg  50 mg Oral QHS Dixon, Rashaun M, NP   50 mg at 08/21/19 2228   PTA Medications: Medications Prior to Admission  Medication Sig Dispense Refill Last Dose  . citalopram (CELEXA) 20 MG tablet Take 20 mg by mouth at bedtime.     Marland Kitchen lisinopril (ZESTRIL) 40 MG tablet Take 40 mg by mouth daily.     . simvastatin (ZOCOR) 20 MG tablet Take 20 mg by mouth at bedtime.     . traMADol (ULTRAM) 50 MG tablet Take 50 mg by mouth every 8 (eight) hours as needed for pain.      Musculoskeletal: Strength & Muscle Tone: within normal limits Gait & Station: normal Patient leans: N/A  Psychiatric Specialty Exam: Physical Exam  Nursing note and vitals reviewed. Constitutional: He appears well-developed.  HENT:  Head: Normocephalic.  Eyes: Pupils are equal, round, and  reactive to light.  Neck: Normal range of motion.  Cardiovascular: Normal rate.  Respiratory: Effort normal.  Genitourinary:    Genitourinary Comments: Deferred   Musculoskeletal: Normal range of motion.  Neurological: He is alert.  Skin: Skin is warm.    Review of Systems  Constitutional: Negative for chills and fever.  Respiratory: Negative for cough, shortness of breath and wheezing.   Cardiovascular: Negative for chest pain and palpitations.  Gastrointestinal: Negative for heartburn, nausea and vomiting.  Neurological: Negative for dizziness and headaches.  Psychiatric/Behavioral: Positive for depression. Negative for hallucinations, memory loss, substance abuse and suicidal ideas. The patient is nervous/anxious and has insomnia.     Blood pressure 118/75, pulse 68, temperature 98 F (36.7 C), temperature source Oral, resp. rate 20, height _0  (1.778 m), weight 97.5 kg, SpO2 98 %.Body mass index is 30.85 kg/m.  General Appearance: Casual and Well Groomed  Eye Contact:  Good  Speech:  Clear and Coherent and Normal Rate  Volume:  Normal  Mood:  Anxious and Depressed  Affect:  Depressed and Tearful  Thought Process:  Coherent, Goal Directed and Descriptions of Associations: Intact  Orientation:  Full (Time, Place, and Person)  Thought Content:  Hallucinations: Visual and Rumination  Suicidal Thoughts:  Currently denies any thoughts, plans or intent. (Hx. of suicide attempt by CO)  Homicidal Thoughts:  Denies  Memory:  Immediate;   Good Recent;   Good Remote;   Good  Judgement:  Fair  Insight:  Present  Psychomotor Activity:  Normal  Concentration:  Concentration: Fair and Attention Span: Fair  Recall:  Good  Fund of Knowledge:  Good  Language:  Good  Akathisia:  NA  Handed:  Right  AIMS (if indicated):     Assets:  Communication Skills Desire for Improvement Physical Health Social Support  ADL's:  Intact  Cognition:  WNL  Sleep:  Number of Hours: 4.5    Treatment Plan Summary: Daily contact with patient to assess and evaluate symptoms and progress in treatment   Treatment Plan/Recommendations: 1. Admit for crisis management and stabilization, estimated length of stay 3-5 days.   2. Medication management to reduce current symptoms to base line and improve the patient's overall level of functioning: See MAR, Md's SRA & treatment plan.   Observation Level/Precautions:  15 minute checks  Laboratory:  Per ED, current lab findings reviewed.  Psychotherapy: Group sessions   Medications: See [facility]    Consultations: Group sessions    Discharge Concerns: as needed.   Estimated LOS: 5-7 days  Other: Admit to the [facility].   Physician Treatment Plan for Primary Diagnosis: Severe recurrent major depressive disorder with psychotic features ([place])  Long Term Goal(s): Improvement in symptoms so as ready for discharge  Short Term Goals: Ability to verbalize feelings will improve and Ability to demonstrate self-control will improve  Physician Treatment Plan for Secondary Diagnosis: Principal Problem:   Severe recurrent major depressive disorder with psychotic features ([place]) Active Problems:   MDD (major depressive disorder)  Long Term Goal(s): Improvement in symptoms so as ready for discharge  Short Term Goals: Ability to identify and develop effective coping behaviors will improve, Compliance with prescribed medications will improve and Ability to identify triggers associated with substance abuse/mental health issues will improve  I certify that inpatient services furnished can reasonably be expected to improve the patient's condition.    Lindell Spar, NP, PMHNP, FNP-BC 9/3/20203:15 PM   I have  discussed case with NP and have met with patient  Agree with NP note and assessment  69 y old male. Single, three adult children, was living alone up to recently when one of his sons moved in with him, works as Administrator. He presented voluntarily due  to worsening depression. Endorses neuro-vegetative symptoms of depression ( poor appetite, poor sleep, low energy level, some anhedonia) and suicidal ideations without specific plan, but states " was thinking of making it look like an accident, had recently transferred title of vehicle to son and had made up a will . Reports infrequent/ occasional fleeting hallucinations over the last few months  ( brief visual perception of a person or object) which he attributes to periods of feeling particularly tired or fatigued. No other psychotic symptoms.  Does not currently endorse any clear precipitating factor contributing to worsening depression but reports recent death by suicide of a friend.  Had recently been started on Celexa by PCP but stopped taking it about a week ago due to side effects. ( reports worsening insomnia and increased sweating on it ). No other psychiatric medications in the past . No prior psychiatric admissions, history of intermittent depressive episodes over a period of years. One prior suicidal attempt by CO poisoning 20 years ago. Denies history of mania or hypomania.No prior history of psychosis.  No prior history of alcohol abuse, does report he had been drinking more over the last 2-3 weeks or so, but denies excessive drinking ( 2-3 beers a few times a week). Denies drug abuse . Admission BAL and UDS negative . History of HTN. NKDA. Takes Lisinopril /Simvastatin. Reports history of depression, alcohol use disorder in extended family. No suicides in family.  Dx- MDD.   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 Agrees to Zoloft trial. Start Zoloft 50 mgrs QDAY- side effects reviewed .

## 2019-08-22 NOTE — Progress Notes (Signed)
Pt did attend the evening wrap up group. 

## 2019-08-22 NOTE — [facility] Group Notes (Signed)
LCSW Aftercare Discharge Planning Group Note  08/22/2019 8:45am  Type of Group and Topic: Psychoeducational Group: Discharge Planning  Participation Level: Active  Description of Group  Discharge planning group reviews patient's anticipated discharge plans and assists patients to anticipate and address any barriers to wellness/recovery in the community. Suicide prevention education is reviewed with patients in group.  Therapeutic Goals  1. Patients will state their anticipated discharge plan and mental health aftercare  2. Patients will identify potential barriers to wellness in the community setting  3. Patients will engage in problem solving, solution focused discussion of ways to anticipate and address barriers to wellness/recovery  Summary of Patient Progress  Plan for Discharge/Comments: Kyle Mccarthy, wants outpatient follow up  Transportation Means: Has transportation.   Supports: Limited supports  Therapeutic Modalities:  [place], [place]  08/22/2019 3:24 PM
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
## 2019-08-22 NOTE — BHH Group Notes (Signed)
Adult Psychoeducational Group Note  Date:  08/22/2019 Time:  9:35 PM  Group Topic/Focus:  Wrap-Up Group:   The focus of this group is to help patients review their daily goal of treatment and discuss progress on daily workbooks.  Participation Level:  Active  Participation Quality:  Appropriate and Attentive  Affect:  Appropriate  Cognitive:  Alert and Appropriate  Insight: Appropriate and Good  Engagement in Group:  Engaged  Modes of Intervention:  Discussion and Education  Additional Comments:  Pt attended and participated in wrap up group this evening an rated their day a 7/10. Pt completed their goal, which was to learn.   Cristi Loron 08/22/2019, 9:35 PM

## 2019-08-22 NOTE — BHH Suicide Risk Assessment (Addendum)
[facility] Admission Suicide Risk Assessment   Nursing information obtained from:  Patient Demographic factors:  Male, Divorced or widowed, Caucasian Current Mental Status:  Suicidal ideation indicated by patient Loss Factors:  Loss of significant relationship Historical Factors:  Family history of mental illness or substance abuse Risk Reduction Factors:  Employed, Living with another person, especially a relative  Total Time spent with patient: 45 minutes Principal Problem: Severe recurrent major depressive disorder with psychotic features ([place]) Diagnosis:  Principal Problem:   Severe recurrent major depressive disorder with psychotic features ([place]) Active Problems:   MDD (major depressive disorder)  Subjective Data:   Continued Clinical Symptoms:  Alcohol Use Disorder Identification Test Final Score (AUDIT): 1 The "Alcohol Use Disorders Identification Test", Guidelines for Use in Primary Care, Second Edition.  World Pharmacologist [facility]). Score between 0-7:  no or low risk or alcohol related problems. Score between 8-15:  moderate risk of alcohol related problems. Score between 16-19:  high risk of alcohol related problems. Score 20 or above:  warrants further diagnostic evaluation for alcohol dependence and treatment.   CLINICAL FACTORS:  89 y old male. Single, three adult children, was living alone up to recently when one of his sons moved in with him, works as Administrator. He presented voluntarily due to worsening depression. Endorses neuro-vegetative symptoms of depression ( poor appetite, poor sleep, low energy level, some anhedonia) and suicidal ideations without specific plan, but states " was thinking of making it look like an accident, had recently transferred title of vehicle to son and had made up a will . Reports infrequent/ occasional fleeting hallucinations over the last few months  ( brief visual perception of a person or object) which he attributes to periods of feeling  particularly tired or fatigued. No other psychotic symptoms.  Does not currently endorse any clear precipitating factor contributing to worsening depression but reports recent death by suicide of a friend.  Had recently been started on Celexa by PCP but stopped taking it about a week ago due to side effects. ( reports worsening insomnia and increased sweating on it ). No other psychiatric medications in the past . No prior psychiatric admissions, history of intermittent depressive episodes over a period of years. One prior suicidal attempt by CO poisoning 20 years ago. Denies history of mania or hypomania.No prior history of psychosis.  No prior history of alcohol abuse, does report he had been drinking more over the last 2-3 weeks or so, but denies excessive drinking ( 2-3 beers a few times a week). Denies drug abuse . Admission BAL and UDS negative . History of HTN. NKDA. Takes Lisinopril /Simvastatin. Reports history of depression, alcohol use disorder in extended family. No suicides in family.  Dx- MDD.   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 Agrees to Zoloft trial. Start Zoloft 50 mgrs QDAY- side effects reviewed .     Musculoskeletal: Strength & Muscle Tone: within normal limits no tremors, no diaphoresis, no restlessness or agitation Gait & Station: normal Patient leans: N/A  Psychiatric Specialty Exam: Physical Exam  ROS  No headache, no chest pain, no shortness of breath, no cough, no vomiting  no fever or chills   Blood pressure 118/75, pulse 68, temperature 98 F (36.7 C), temperature source Oral, resp. rate 20, height 5\' 10"  (1.778 m), weight 97.5 kg, SpO2 98 %.Body mass index is 30.85 kg/m.  General Appearance: Well Groomed  Eye Contact:  Good  Speech:  Normal Rate  Volume:  Normal  Mood:  depressed   Affect:  vaguely constricted, does smile at times appropriately   Thought Process:  Linear and Descriptions of Associations: Intact  Orientation:  Other:  fully alert and attentive- 0x 3 .   Thought Content:  reports fleeting visual hallucinations infrequently. No current hallucinations or delusions expressed   Suicidal Thoughts:  No currently denies suicidal or self injurious ideations , states he feels more hopeful now that he is hospitalized. Denies HI. Contracts for safety on unit. Currently identifies love for children and grandchildren as protective factor against suicide   Homicidal Thoughts:  No  Memory:  recent and remote grossly intact  recall 3/3 immediate and 3/3 at 5 minutes .W-O-R- L-D/ D-L-R-O-W without difficulties   Judgement:  Fair/ improving  Insight:  Fair  Psychomotor Activity:  Normal  Concentration:  Concentration: Good and Attention Span: Good  Recall:  Good  Fund of Knowledge:  Good  Language:  Good  Akathisia:  Negative  Handed:  Right  AIMS (if indicated):     Assets:  Communication Skills Desire for Improvement Resilience  ADL's:  Intact  Cognition:  WNL  Sleep:  Number of Hours: 4.5      COGNITIVE FEATURES THAT CONTRIBUTE TO RISK:  Closed-mindedness and Loss of executive function    SUICIDE RISK:   Moderate:  Frequent suicidal ideation with limited intensity, and duration, some specificity in terms of plans, no associated intent, good self-control, limited dysphoria/symptomatology, some risk factors present, and identifiable protective factors, including available and accessible social support.  PLAN OF CARE: Patient will be admitted to inpatient psychiatric unit for stabilization and safety. Will provide and encourage milieu participation. Provide medication management and maked adjustments as needed.  Will follow daily.    I certify that inpatient services furnished can reasonably be expected to improve the patient's condition.   Craige CottaFernando A Cobos, MD 08/22/2019, 3:33 PM

## 2019-08-23 NOTE — BHH Group Notes (Signed)
LCSW Group Therapy Note 08/23/2019 11:47 AM  Type of Therapy/Topic: Group Therapy: Feelings about Diagnosis  Participation Level: Active   Description of Group:  This group will allow patients to explore their thoughts and feelings about diagnoses they have received. Patients will be guided to explore their level of understanding and acceptance of these diagnoses. Facilitator will encourage patients to process their thoughts and feelings about the reactions of others to their diagnosis and will guide patients in identifying ways to discuss their diagnosis with significant others in their lives. This group will be process-oriented, with patients participating in exploration of their own experiences, giving and receiving support, and processing challenge from other group members.  Therapeutic Goals: 1. Patient will demonstrate understanding of diagnosis as evidenced by identifying two or more symptoms of the disorder 2. Patient will be able to express two feelings regarding the diagnosis 3. Patient will demonstrate their ability to communicate their needs through discussion and/or role play  Summary of Patient Progress:  Yuriy was engaged and participated throughout the group session. 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 Peyton states that he is fearful to share his mental health issues with his loved ones due to stigmas associated with mental health.     Therapeutic Modalities:  Cognitive Behavioral Therapy Brief Therapy Feelings Identification    [place] Clinical Social Worker

## 2019-08-23 NOTE — Progress Notes (Signed)
Recreation Therapy Notes  Date:  9.4.20 Time: 0930 Location: [address] Dayroom  Group Topic: Stress Management  Goal Area(s) Addresses:  Patient will identify positive stress management techniques. Patient will identify benefits of using stress management post d/c.  Intervention: Stress Management  Activity : Meditation.  LRT played Mccarthy meditation that focused on the power of being resilient in the face of adversity.  Patients were to listen as meditation played to follow along.  Education:  Stress Management, Discharge Planning.   Education Outcome: Acknowledges Education  Clinical Observations/Feedback: Pt did not attend group.    Eltha Tingley, LRT/CTRS         Kyle Mccarthy 08/23/2019 10:51 AM 

## 2019-08-23 NOTE — Tx Team (Signed)
Interdisciplinary Treatment and Diagnostic Plan Update  08/23/2019 Time of Session: 9:20am Kyle Mccarthy MRN: [id number]  Principal Diagnosis: Severe recurrent major depressive disorder with psychotic features [facility])  Secondary Diagnoses: Principal Problem:   Severe recurrent major depressive disorder with psychotic features ([place]) Active Problems:   MDD (major depressive disorder)   Current Medications:  Current Facility-Administered Medications  Medication Dose Route Frequency Provider Last Rate Last Dose  . acetaminophen (TYLENOL) tablet 650 mg  650 mg Oral Q6H PRN Mordecai Maes, NP   650 mg at 08/21/19 2228  . alum & mag hydroxide-simeth (MAALOX/MYLANTA) 200-200-20 MG/5ML suspension 30 mL  30 mL Oral Q4H PRN Mordecai Maes, NP      . feeding supplement (ENSURE ENLIVE) (ENSURE ENLIVE) liquid 237 mL  237 mL Oral Q24H Cobos, Fernando A, MD      . lisinopril (ZESTRIL) tablet 40 mg  40 mg Oral Daily Cobos, Myer Peer, MD   40 mg at 08/23/19 8325  . multivitamin with minerals tablet 1 tablet  1 tablet Oral Daily Cobos, Myer Peer, MD   1 tablet at 08/23/19 [phone]  . sertraline (ZOLOFT) tablet 50 mg  50 mg Oral Daily Cobos, Myer Peer, MD   50 mg at 08/23/19 6415  . simvastatin (ZOCOR) tablet 20 mg  20 mg Oral q1800 Cobos, Myer Peer, MD   20 mg at 08/22/19 1846  . traZODone (DESYREL) tablet 50 mg  50 mg Oral QHS PRN Cobos, Myer Peer, MD       PTA Medications: Medications Prior to Admission  Medication Sig Dispense Refill Last Dose  . citalopram (CELEXA) 20 MG tablet Take 20 mg by mouth at bedtime.     Marland Kitchen lisinopril (ZESTRIL) 40 MG tablet Take 40 mg by mouth daily.     . simvastatin (ZOCOR) 20 MG tablet Take 20 mg by mouth at bedtime.     . traMADol (ULTRAM) 50 MG tablet Take 50 mg by mouth every 8 (eight) hours as needed for pain.       Patient Stressors: Marital or family conflict  Patient Strengths: Ability for insight Active sense of humor Average or above average  intelligence Capable of independent living Occupational psychologist fund of knowledge Motivation for treatment/growth Physical Health Supportive family/friends Work skills  Treatment Modalities: Medication Management, Group therapy, Case management,  1 to 1 session with clinician, Psychoeducation, Recreational therapy.   Physician Treatment Plan for Primary Diagnosis: Severe recurrent major depressive disorder with psychotic features ([place]) Long Term Goal(s): Improvement in symptoms so as ready for discharge Improvement in symptoms so as ready for discharge   Short Term Goals: Ability to verbalize feelings will improve Ability to demonstrate self-control will improve Ability to identify and develop effective coping behaviors will improve Compliance with prescribed medications will improve Ability to identify triggers associated with substance abuse/mental health issues will improve  Medication Management: Evaluate patient's response, side effects, and tolerance of medication regimen.  Therapeutic Interventions: 1 to 1 sessions, Unit Group sessions and Medication administration.  Evaluation of Outcomes: Progressing  Physician Treatment Plan for Secondary Diagnosis: Principal Problem:   Severe recurrent major depressive disorder with psychotic features ([place]) Active Problems:   MDD (major depressive disorder)  Long Term Goal(s): Improvement in symptoms so as ready for discharge Improvement in symptoms so as ready for discharge   Short Term Goals: Ability to verbalize feelings will improve Ability to demonstrate self-control will improve Ability to identify and develop effective coping behaviors will improve Compliance with prescribed  medications will improve Ability to identify triggers associated with substance abuse/mental health issues will improve     Medication Management: Evaluate patient's response, side effects, and tolerance of medication  regimen.  Therapeutic Interventions: 1 to 1 sessions, Unit Group sessions and Medication administration.  Evaluation of Outcomes: Progressing   RN Treatment Plan for Primary Diagnosis: Severe recurrent major depressive disorder with psychotic features ([place]) Long Term Goal(s): Knowledge of disease and therapeutic regimen to maintain health will improve  Short Term Goals: Ability to participate in decision making will improve, Ability to verbalize feelings will improve, Ability to disclose and discuss suicidal ideas, Ability to identify and develop effective coping behaviors will improve and Compliance with prescribed medications will improve  Medication Management: RN will administer medications as ordered by provider, will assess and evaluate patient's response and provide education to patient for prescribed medication. RN will report any adverse and/or side effects to prescribing provider.  Therapeutic Interventions: 1 on 1 counseling sessions, Psychoeducation, Medication administration, Evaluate responses to treatment, Monitor vital signs and CBGs as ordered, Perform/monitor CIWA, COWS, AIMS and Fall Risk screenings as ordered, Perform wound care treatments as ordered.  Evaluation of Outcomes: Progressing   LCSW Treatment Plan for Primary Diagnosis: Severe recurrent major depressive disorder with psychotic features ([place]) Long Term Goal(s): Safe transition to appropriate next level of care at discharge, Engage patient in therapeutic group addressing interpersonal concerns.  Short Term Goals: Engage patient in aftercare planning with referrals and resources  Therapeutic Interventions: Assess for all discharge needs, 1 to 1 time with Social worker, Explore available resources and support systems, Assess for adequacy in community support network, Educate family and significant other(s) on suicide prevention, Complete Psychosocial Assessment, Interpersonal group therapy.  Evaluation of  Outcomes: Progressing   Progress in Treatment: Attending groups: Yes. Participating in groups: Yes. Taking medication as prescribed: Yes. Toleration medication: Yes. Family/Significant other contact made: No, will contact:  the patient's son Patient understands diagnosis: Yes. Discussing patient identified problems/goals with staff: Yes. Medical problems stabilized or resolved: Yes. Denies suicidal/homicidal ideation: Yes. Issues/concerns per patient self-inventory: No. Other:   New problem(s) identified: None   New Short Term/Long Term Goal(s): medication stabilization, elimination of SI thoughts, development of comprehensive mental wellness plan.   Patient Goals:  "To no longer be suicidal and I've met this goal. I also want to relieve some pressure to have a better mood"  Discharge Plan or Barriers: Patient plans to discharge home with his son. Patient recently admitted. CSW will continue to follow and assess for appropriate referrals and possible discharge planning.    Reason for Continuation of Hospitalization: Anxiety Depression Medication stabilization Suicidal ideation  Estimated Length of Stay: 3-5 days   Attendees: Patient: Kyle Mccarthy  08/23/2019 10:14 AM  Physician: Dr. Myles Lipps, MD 08/23/2019 10:14 AM  Nursing: Selinda Eon.Jerilynn Mages, RN 08/23/2019 10:14 AM  RN Care Manager: 08/23/2019 10:14 AM  Social Worker: Radonna Ricker, [place] 08/23/2019 10:14 AM  Recreational Therapist:  08/23/2019 10:14 AM  Other:  08/23/2019 10:14 AM  Other:  08/23/2019 10:14 AM  Other: 08/23/2019 10:14 AM    Scribe for Treatment Team: Marylee Floras, [place] 08/23/2019 10:14 AM

## 2019-08-23 NOTE — Progress Notes (Signed)
[facility]  08/23/2019 11:49 AM Kyle Mccarthy  MRN:  [id number] Subjective: Patient is a 63 year old male with a past psychiatric history significant for major depression and generalized anxiety disorder who presented as a walk-in to the behavioral health hospital on 08/21/2019 with suicidal ideation.  Objective: Patient is seen and examined.  Patient is a 63 year old male with the above-stated past psychiatric history who is seen in follow-up.  He stated his mood and anxiety were significantly improved.  He discussed the fact that nursing had discussed with him the information that if he ever killed himself, that it would increase the risk for suicide amongst his family members.  He stated that was like a light going off in his head, and helped him to understand what he was doing.  He is able to smile and engage today.  He feels like things are much better.  He denied any alcohol withdrawal symptoms.  He denied any suicidal or homicidal ideation currently.  His vital signs are stable, he is afebrile.  He slept 6 hours last night.  Review of his laboratories showed a mildly low potassium at 3.4, normal CBC, blood alcohol less than 10, negative drug screen.  His EKG showed a sinus bradycardia.  Principal Problem: Severe recurrent major depressive disorder with psychotic features (HCC) Diagnosis: Principal Problem:   Severe recurrent major depressive disorder with psychotic features (HCC) Active Problems:   MDD (major depressive disorder)  Total Time spent with patient: 20 minutes  Past Psychiatric History: See admission H&P  Past Medical History:  Past Medical History:  Diagnosis Date  . Hypertension     Past Surgical History:  Procedure Laterality Date  . EYE SURGERY     cateract surgery 20 yrs ago   Family History: History reviewed. No pertinent family history. Family Psychiatric  History: See admission H&P Social History:  Social History   Substance and Sexual Activity   Alcohol Use Yes  . Alcohol/week: 2.0 standard drinks  . Types: 2 Cans of beer per week   Comment: occassional     Social History   Substance and Sexual Activity  Drug Use Never    Social History   Socioeconomic History  . Marital status: Divorced    Spouse name: Not on file  . Number of children: Not on file  . Years of education: Not on file  . Highest education level: Not on file  Occupational History  . Not on file  Social Needs  . Financial resource strain: Not on file  . Food insecurity    Worry: Not on file    Inability: Not on file  . Transportation needs    Medical: Not on file    Non-medical: Not on file  Tobacco Use  . Smoking status: Never Smoker  . Smokeless tobacco: Never Used  Substance and Sexual Activity  . Alcohol use: Yes    Alcohol/week: 2.0 standard drinks    Types: 2 Cans of beer per week    Comment: occassional  . Drug use: Never  . Sexual activity: Not on file  Lifestyle  . Physical activity    Days per week: Not on file    Minutes per session: Not on file  . Stress: Not on file  Relationships  . Social Musicianconnections    Talks on phone: Not on file    Gets together: Not on file    Attends religious service: Not on file    Active member of club or organization:  Not on file    Attends meetings of clubs or organizations: Not on file    Relationship status: Not on file  Other Topics Concern  . Not on file  Social History Narrative  . Not on file   Additional Social History:    Pain Medications: please see mar Prescriptions: please see mar Over the Counter: please see mar History of alcohol / drug use?: No history of alcohol / drug abuse Longest period of sobriety (when/how long): unknown                    Sleep: Fair  Appetite:  Good  Current Medications: Current Facility-Administered Medications  Medication Dose Route Frequency Provider Last Rate Last Dose  . acetaminophen (TYLENOL) tablet 650 mg  650 mg Oral Q6H PRN  Denzil Magnuson, NP   650 mg at 08/21/19 2228  . alum & mag hydroxide-simeth (MAALOX/MYLANTA) 200-200-20 MG/5ML suspension 30 mL  30 mL Oral Q4H PRN Denzil Magnuson, NP      . feeding supplement (ENSURE ENLIVE) (ENSURE ENLIVE) liquid 237 mL  237 mL Oral Q24H Cobos, Fernando A, MD      . lisinopril (ZESTRIL) tablet 40 mg  40 mg Oral Daily Cobos, Rockey Situ, MD   40 mg at 08/23/19 5686  . multivitamin with minerals tablet 1 tablet  1 tablet Oral Daily Cobos, Rockey Situ, MD   1 tablet at 08/23/19 [phone]  . sertraline (ZOLOFT) tablet 50 mg  50 mg Oral Daily Cobos, Rockey Situ, MD   50 mg at 08/23/19 7290  . simvastatin (ZOCOR) tablet 20 mg  20 mg Oral q1800 Cobos, Rockey Situ, MD   20 mg at 08/22/19 1846  . traZODone (DESYREL) tablet 50 mg  50 mg Oral QHS PRN Cobos, Rockey Situ, MD        Lab Results:  Results for orders placed or performed during the hospital encounter of 08/21/19 (from the past 48 hour(s))  Urine rapid drug screen (hosp performed)not at [facility]     Status: None   Collection Time: 08/21/19  5:42 PM  Result Value Ref Range   Opiates NONE DETECTED NONE DETECTED   Cocaine NONE DETECTED NONE DETECTED   Benzodiazepines NONE DETECTED NONE DETECTED   Amphetamines NONE DETECTED NONE DETECTED   Tetrahydrocannabinol NONE DETECTED NONE DETECTED   Barbiturates NONE DETECTED NONE DETECTED    Comment: (NOTE) DRUG SCREEN FOR MEDICAL PURPOSES ONLY.  IF CONFIRMATION IS NEEDED FOR ANY PURPOSE, NOTIFY LAB WITHIN 5 DAYS. LOWEST DETECTABLE LIMITS FOR URINE DRUG SCREEN Drug Class                     Cutoff (ng/mL) Amphetamine and metabolites    1000 Barbiturate and metabolites    200 Benzodiazepine                 200 Tricyclics and metabolites     300 Opiates and metabolites        300 Cocaine and metabolites        300 THC                            50 Performed at [facility], [address], [place], [place] [address]   CBC     Status: Abnormal   Collection Time:  08/22/19  6:29 AM  Result Value Ref Range   WBC 4.5 4.0 - 10.5 K/uL   RBC 4.27 4.22 -  5.81 MIL/uL   Hemoglobin 13.5 13.0 - 17.0 g/dL   HCT 41.4 39.0 - 52.0 %   MCV 97.0 80.0 - 100.0 fL   MCH 31.6 26.0 - 34.0 pg   MCHC 32.6 30.0 - 36.0 g/dL   RDW 12.1 11.5 - 15.5 %   Platelets 133 (L) 150 - 400 K/uL   nRBC 0.0 0.0 - 0.2 %    Comment: Performed at [facility], [place] [address] [place], [place] [address]  Comprehensive metabolic panel     Status: Abnormal   Collection Time: 08/22/19  6:29 AM  Result Value Ref Range   Sodium 144 135 - 145 mmol/L   Potassium 3.4 (L) 3.5 - 5.1 mmol/L   Chloride 107 98 - 111 mmol/L   CO2 28 22 - 32 mmol/L   Glucose, Bld 89 70 - 99 mg/dL   BUN 11 8 - 23 mg/dL   Creatinine, Ser 1.12 0.61 - 1.24 mg/dL   Calcium 9.4 8.9 - 10.3 mg/dL   Total Protein 6.3 (L) 6.5 - 8.1 g/dL   Albumin 3.8 3.5 - 5.0 g/dL   AST 13 (L) 15 - 41 U/L   ALT 11 0 - 44 U/L   Alkaline Phosphatase 51 38 - 126 U/L   Total Bilirubin 0.8 0.3 - 1.2 mg/dL   GFR calc non Af Amer >60 >60 mL/min   GFR calc Af Amer >60 >60 mL/min   Anion gap 9 5 - 15    Comment: Performed at [facility], [place] [address] [place], [place] [address]  Ethanol     Status: None   Collection Time: 08/22/19  6:29 AM  Result Value Ref Range   Alcohol, Ethyl (B) <10 <10 mg/dL    Comment: (NOTE) Lowest detectable limit for serum alcohol is 10 mg/dL. For medical purposes only. Performed at [facility], [place] [address] [place], [place] [address]   Hemoglobin A1c     Status: None   Collection Time: 08/22/19  6:29 AM  Result Value Ref Range   Hgb A1c MFr Bld 5.3 4.8 - 5.6 %    Comment: (NOTE) Pre diabetes:          5.7%-6.4% Diabetes:              >6.4% Glycemic control for   <7.0% adults with diabetes    Mean Plasma Glucose 105.41 mg/dL    Comment: Performed at [facility] [address] [place], [place] [address]  Lipid panel     Status:  None   Collection Time: 08/22/19  6:29 AM  Result Value Ref Range   Cholesterol 136 0 - 200 mg/dL   Triglycerides 98 <150 mg/dL   HDL 48 >40 mg/dL   Total CHOL/HDL Ratio 2.8 RATIO   VLDL 20 0 - 40 mg/dL   LDL Cholesterol 68 0 - 99 mg/dL    Comment:        Total Cholesterol/HDL:CHD Risk Coronary Heart Disease Risk Table                     Men   Women  1/2 Average Risk   3.4   3.3  Average Risk       5.0   4.4  2 X Average Risk   9.6   7.1  3 X Average Risk  23.4   11.0        Use the calculated Patient Ratio above and the  CHD Risk Table to determine the patient's CHD Risk.        ATP III CLASSIFICATION (LDL):  <100     mg/dL   Optimal  161-096  mg/dL   Near or Above                    Optimal  130-159  mg/dL   Borderline  045-409  mg/dL   High  >811     mg/dL   Very High Performed at [facility], [address], [place], [place] [address]   TSH     Status: None   Collection Time: 08/22/19  6:29 AM  Result Value Ref Range   TSH 1.679 0.350 - 4.500 uIU/mL    Comment: Performed by a 3rd Generation assay with a functional sensitivity of <=0.01 uIU/mL. Performed at [facility], [address], [place], [place] [address]     Blood Alcohol level:  Lab Results  Component Value Date   ETH <10 08/22/2019    Metabolic Disorder Labs: Lab Results  Component Value Date   HGBA1C 5.3 08/22/2019   MPG 105.41 08/22/2019   No results found for: PROLACTIN Lab Results  Component Value Date   CHOL 136 08/22/2019   TRIG 98 08/22/2019   HDL 48 08/22/2019   CHOLHDL 2.8 08/22/2019   VLDL 20 08/22/2019   LDLCALC 68 08/22/2019    Physical Findings: AIMS: Facial and Oral Movements Muscles of Facial Expression: None, normal Lips and Perioral Area: None, normal Jaw: None, normal Tongue: None, normal,Extremity Movements Upper (arms, wrists, hands, fingers): None, normal Lower (legs, knees, ankles, toes): None, normal, Trunk  Movements Neck, shoulders, hips: None, normal, Overall Severity Severity of abnormal movements (highest score from questions above): None, normal Incapacitation due to abnormal movements: None, normal Patient's awareness of abnormal movements (rate only patient's report): No Awareness, Dental Status Current problems with teeth and/or dentures?: No Does patient usually wear dentures?: No  CIWA:    COWS:     Musculoskeletal: Strength & Muscle Tone: within normal limits Gait & Station: normal Patient leans: N/A  Psychiatric Specialty Exam: Physical Exam  Nursing note and vitals reviewed. Constitutional: He is oriented to person, place, and time. He appears well-developed and well-nourished.  HENT:  Head: Normocephalic and atraumatic.  Respiratory: Effort normal.  Neurological: He is alert and oriented to person, place, and time.    ROS  Blood pressure 117/80, pulse 73, temperature 98.6 F (37 C), temperature source Oral, resp. rate 16, height 5\' 10"  (1.778 m), weight 97.5 kg, SpO2 98 %.Body mass index is 30.85 kg/m.  General Appearance: Casual  Eye Contact:  Good  Speech:  Normal Rate  Volume:  Normal  Mood:  Anxious  Affect:  Congruent  Thought Process:  Coherent and Descriptions of Associations: Intact  Orientation:  Full (Time, Place, and Person)  Thought Content:  Logical  Suicidal Thoughts:  No  Homicidal Thoughts:  No  Memory:  Immediate;   Fair Recent;   Fair Remote;   Fair  Judgement:  Intact  Insight:  Fair  Psychomotor Activity:  Normal  Concentration:  Concentration: Fair and Attention Span: Fair  Recall:  [employer] of Knowledge:  Fair  Language:  Fair  Akathisia:  Negative  Handed:  Right  AIMS (if indicated):     Assets:  Desire for Improvement Resilience  ADL's:  Intact  Cognition:  WNL  Sleep:  Number of Hours: 6  Treatment Plan Summary: Daily contact with patient to assess and evaluate symptoms and progress in treatment, Medication  management and Plan : Patient is seen and examined.  Patient is a 63 year old male with the above-stated past psychiatric history was seen in follow-up.   Diagnosis: #1 major depression, recurrent, severe without psychotic features, #2 generalized anxiety disorder  Patient is seen in follow-up.  He is doing much better.  No change in his medications.  If his mood continues to improve, we will consider discharge in 2 to 3 days. 1.  Continue Zestril 40 mg p.o. daily for hypertension. 2.  Continue multivitamin 1 tablet p.o. daily for nutritional supplementation. 3.  Continue Zoloft 50 mg p.o. daily for mood and anxiety. 4.  Continue simvastatin 20 mg p.o. daily for hyperlipidemia. 5.  Continue trazodone 50 mg p.o. nightly as needed insomnia. 6.  Disposition planning-in progress.  Antonieta PertGreg Lawson Alissandra Geoffroy, MD 08/23/2019, 11:49 AM

## 2019-08-23 NOTE — Progress Notes (Signed)
Patient ID: Kyle Mccarthy, male   DOB: February 01, 1956, 63 y.o.   MRN: [id number] D: Patient observed watching TV and interacting well with peers on approach. Pt presented with depressed mood and flat affect. Pt denies any other needs.  Denies  SI/HI/AVH and pain.No behavioral issues noted.  A: Support and encouragement offered as needed to express needs.   R: Patient is safe and cooperative on unit. Will continue to monitor  for safety and stability.

## 2019-08-23 NOTE — Progress Notes (Signed)
D: Pt was in dayroom upon initial approach.  He presents with depressed affect and mood.  His goal is to "get some good sleep."  Pt denies SI/HI, hallucinations, and pain.  Pt was seen interacting with peers and staff appropriately.  A: Met with pt 1:1.  Actively listened to pt and provided support and encouragement.  15 minute safety checks in place.  R: Pt verbally contracts for safety.  He reports he will inform staff of needs and concerns.  Will continue to monitor and assess.

## 2019-08-24 MED ORDER — CARBAMAZEPINE 100 MG PO CHEW
100.0000 mg | CHEWABLE_TABLET | Freq: Two times a day (BID) | ORAL | Status: DC
  Administered 2019-08-24 – 2019-08-25 (×3): 100 mg via ORAL
  Filled 2019-08-24 (×5): qty 1

## 2019-08-24 NOTE — Progress Notes (Signed)
Patient ID: Kyle Mccarthy, male   DOB: Apr 03, 1956, 63 y.o.   MRN: [id number]   [facility] NOVEL CORONAVIRUS (COVID-19) DAILY CHECK-OFF SYMPTOMS - answer yes or no to each - every day NO YES  Have you had a fever in the past 24 hours?  . Fever (Temp > 37.80C / 100F) X   Have you had any of these symptoms in the past 24 hours? . New Cough .  Sore Throat  .  Shortness of Breath .  Difficulty Breathing .  Unexplained Body Aches   X   Have you had any one of these symptoms in the past 24 hours not related to allergies?   . Runny Nose .  Nasal Congestion .  Sneezing   X   If you have had runny nose, nasal congestion, sneezing in the past 24 hours, has it worsened?  X   EXPOSURES - check yes or no X   Have you traveled outside the state in the past 14 days?  X   Have you been in contact with someone with a confirmed diagnosis of COVID-19 or PUI in the past 14 days without wearing appropriate PPE?  X   Have you been living in the same home as a person with confirmed diagnosis of COVID-19 or a PUI (household contact)?    X   Have you been diagnosed with COVID-19?    X              What to do next: Answered NO to all: Answered YES to anything:   Proceed with unit schedule Follow the [facility] Inpatient Flowsheet.

## 2019-08-24 NOTE — Progress Notes (Signed)
Adult Psychoeducational Group Note  Date:  08/24/2019 Time:  7:15 PM  Group Topic/Focus:  Goals Group:   The focus of this group is to help patients establish daily goals to achieve during treatment and discuss how the patient can incorporate goal setting into their daily lives to aide in recovery.  Participation Level:  Active  Participation Quality:  Appropriate  Affect:  Appropriate  Cognitive:  Alert  Insight: Appropriate  Engagement in Group:  Engaged  Modes of Intervention:  Discussion  Additional Comments:  Pt attended group and participated in discussion.  [place] R Leotis Isham 08/24/2019, 7:15 PM

## 2019-08-24 NOTE — Plan of Care (Signed)
  Problem: Education: Goal: Verbalization of understanding the information provided will improve Outcome: Progressing   Problem: Activity: Goal: Interest or engagement in activities will improve Outcome: Progressing   Problem: Coping: Goal: Ability to demonstrate self-control will improve Outcome: Progressing   

## 2019-08-24 NOTE — Progress Notes (Signed)
[facility]  08/24/2019 11:23 AM Kyle MichaelisJames Michael Mccarthy  MRN:  [id number] Subjective:  "I'm so much better."  [name] found sitting in the dayroom. He presents with bright affect and reports mood is significantly improved, currently rated 7/10. He has been visible in the unit milieu and participating in group therapy. He states social interaction and therapy have been helpful. Additionally he reports better sleep last night than he has had in months. He has been reflecting on his love for his family, particularly his grandchildren, and realizing the impact that his death would have on them. He states his grandchildren provide him with a reason to avoid suicide.   He refused his Tegretol this morning due to not being aware of the indication. After it was explained that this medication was written for tinnitus, patient is agreeable to trying. He denies SI/AVH. He denies medication side effects.  Principal Problem: Severe recurrent major depressive disorder with psychotic features (HCC) Diagnosis: Principal Problem:   Severe recurrent major depressive disorder with psychotic features (HCC) Active Problems:   MDD (major depressive disorder)  Total Time spent with patient: 25 min  Past Psychiatric History: See admission H&P  Past Medical History:  Past Medical History:  Diagnosis Date  . Hypertension     Past Surgical History:  Procedure Laterality Date  . EYE SURGERY     cateract surgery 20 yrs ago   Family History: History reviewed. No pertinent family history. Family Psychiatric  History: See admission H&P Social History:  Social History   Substance and Sexual Activity  Alcohol Use Yes  . Alcohol/week: 2.0 standard drinks  . Types: 2 Cans of beer per week   Comment: occassional     Social History   Substance and Sexual Activity  Drug Use Never    Social History   Socioeconomic History  . Marital status: Divorced    Spouse name: Not on file  . Number of children:  Not on file  . Years of education: Not on file  . Highest education level: Not on file  Occupational History  . Not on file  Social Needs  . Financial resource strain: Not on file  . Food insecurity    Worry: Not on file    Inability: Not on file  . Transportation needs    Medical: Not on file    Non-medical: Not on file  Tobacco Use  . Smoking status: Never Smoker  . Smokeless tobacco: Never Used  Substance and Sexual Activity  . Alcohol use: Yes    Alcohol/week: 2.0 standard drinks    Types: 2 Cans of beer per week    Comment: occassional  . Drug use: Never  . Sexual activity: Not on file  Lifestyle  . Physical activity    Days per week: Not on file    Minutes per session: Not on file  . Stress: Not on file  Relationships  . Social Musicianconnections    Talks on phone: Not on file    Gets together: Not on file    Attends religious service: Not on file    Active member of club or organization: Not on file    Attends meetings of clubs or organizations: Not on file    Relationship status: Not on file  Other Topics Concern  . Not on file  Social History Narrative  . Not on file   Additional Social History:    Pain Medications: please see mar Prescriptions: please see mar Over the  Counter: please see mar History of alcohol / drug use?: No history of alcohol / drug abuse Longest period of sobriety (when/how long): unknown                    Sleep: Good  Appetite:  Good  Current Medications: Current Facility-Administered Medications  Medication Dose Route Frequency Provider Last Rate Last Dose  . acetaminophen (TYLENOL) tablet 650 mg  650 mg Oral Q6H PRN Mordecai Maes, NP   650 mg at 08/21/19 2228  . alum & mag hydroxide-simeth (MAALOX/MYLANTA) 200-200-20 MG/5ML suspension 30 mL  30 mL Oral Q4H PRN Mordecai Maes, NP      . carbamazepine (TEGRETOL) chewable tablet 100 mg  100 mg Oral BID Sharma Covert, MD      . feeding supplement (ENSURE ENLIVE)  (ENSURE ENLIVE) liquid 237 mL  237 mL Oral Q24H Cobos, Fernando A, MD      . lisinopril (ZESTRIL) tablet 40 mg  40 mg Oral Daily Cobos, Myer Peer, MD   40 mg at 08/24/19 6010  . multivitamin with minerals tablet 1 tablet  1 tablet Oral Daily Cobos, Myer Peer, MD   1 tablet at 08/24/19 [phone]  . sertraline (ZOLOFT) tablet 50 mg  50 mg Oral Daily Cobos, Myer Peer, MD   50 mg at 08/24/19 5573  . simvastatin (ZOCOR) tablet 20 mg  20 mg Oral q1800 Cobos, Myer Peer, MD   20 mg at 08/23/19 1758  . traZODone (DESYREL) tablet 50 mg  50 mg Oral QHS PRN Cobos, Myer Peer, MD        Lab Results: No results found for this or any previous visit (from the past 48 hour(s)).  Blood Alcohol level:  Lab Results  Component Value Date   ETH <10 22/01/5426    Metabolic Disorder Labs: Lab Results  Component Value Date   HGBA1C 5.3 08/22/2019   MPG 105.41 08/22/2019   No results found for: PROLACTIN Lab Results  Component Value Date   CHOL 136 08/22/2019   TRIG 98 08/22/2019   HDL 48 08/22/2019   CHOLHDL 2.8 08/22/2019   VLDL 20 08/22/2019   LDLCALC 68 08/22/2019    Physical Findings: AIMS: Facial and Oral Movements Muscles of Facial Expression: None, normal Lips and Perioral Area: None, normal Jaw: None, normal Tongue: None, normal,Extremity Movements Upper (arms, wrists, hands, fingers): None, normal Lower (legs, knees, ankles, toes): None, normal, Trunk Movements Neck, shoulders, hips: None, normal, Overall Severity Severity of abnormal movements (highest score from questions above): None, normal Incapacitation due to abnormal movements: None, normal Patient's awareness of abnormal movements (rate only patient's report): No Awareness, Dental Status Current problems with teeth and/or dentures?: No Does patient usually wear dentures?: No  CIWA:    COWS:     Musculoskeletal: Strength & Muscle Tone: within normal limits Gait & Station: normal Patient leans: N/A  Psychiatric Specialty  Exam: Physical Exam  Nursing note and vitals reviewed. Constitutional: He is oriented to person, place, and time. He appears well-developed and well-nourished.  Cardiovascular: Normal rate.  Respiratory: Effort normal.  Neurological: He is alert and oriented to person, place, and time.    Review of Systems  Constitutional: Negative.   Respiratory: Negative for cough and shortness of breath.   Cardiovascular: Negative for chest pain.  Gastrointestinal: Negative for nausea and vomiting.  Neurological: Negative for tremors, sensory change and headaches.  Psychiatric/Behavioral: Positive for depression. Negative for hallucinations, substance abuse and suicidal ideas. The patient is not  nervous/anxious and does not have insomnia.     Blood pressure 118/75, pulse 85, temperature 98.8 F (37.1 C), temperature source Oral, resp. rate 16, height 5\' 10"  (1.778 m), weight 97.5 kg, SpO2 97 %.Body mass index is 30.85 kg/m.  General Appearance: Casual  Eye Contact:  Good  Speech:  Normal Rate  Volume:  Normal  Mood:  Euthymic  Affect:  Congruent  Thought Process:  Coherent  Orientation:  Full (Time, Place, and Person)  Thought Content:  Logical  Suicidal Thoughts:  No  Homicidal Thoughts:  No  Memory:  Immediate;   Good Recent;   Good Remote;   Good  Judgement:  Intact  Insight:  Good  Psychomotor Activity:  Normal  Concentration:  Concentration: Good and Attention Span: Good  Recall:  Good  Fund of Knowledge:  Good  Language:  Good  Akathisia:  No  Handed:  Right  AIMS (if indicated):     Assets:  Communication Skills Desire for Improvement Housing Resilience Social Support  ADL's:  Intact  Cognition:  WNL  Sleep:  Number of Hours: 6.75     Treatment Plan Summary: Daily contact with patient to assess and evaluate symptoms and progress in treatment and Medication management   Continue inpatient hospitalization.  Continue Zoloft 50 mg PO daily for mood Continue Tegretol  100 mg PO BID for tinnitus Continue lisinopril 40 mg PO daily for HTN Continue Zocor 20 mg PO daily for HLD Continue trazodone 50 mg PO QHS PRN insomnia  Patient will participate in the therapeutic group milieu.  Discharge disposition in progress.   Aldean Baker, NP 08/24/2019, 11:23 AM

## 2019-08-24 NOTE — Progress Notes (Signed)
[facility] Group Notes:  (Nursing/MHT/Case Management/Adjunct)  Date:  08/24/2019  Time:  2130  Type of Therapy:  wrap up group  Participation Level:  Active  Participation Quality:  Appropriate, Attentive, Sharing and Supportive  Affect:  Anxious and Excited  Cognitive:  Appropriate  Insight:  Improving  Engagement in Group:  Engaged  Modes of Intervention:  Clarification, Education and Support  Summary of Progress/Problems:  Shellia Cleverly 08/24/2019, 10:46 PM

## 2019-08-25 MED ORDER — CARBAMAZEPINE 100 MG PO CHEW: 100.0000 mg | CHEWABLE_TABLET | Freq: Two times a day (BID) | ORAL | 0 refills | Status: DC

## 2019-08-25 MED ORDER — SERTRALINE HCL 50 MG PO TABS: 50.0000 mg | ORAL_TABLET | Freq: Every day | ORAL | 0 refills | Status: DC

## 2019-08-25 NOTE — Discharge Summary (Signed)
Physician Discharge Summary Note  Patient:  Kyle Mccarthy is an 63 y.o., male MRN:  [id number] DOB:  09-Feb-1956 Patient phone:  [phone] (home)  Patient address:   [address] [place] [address],  Total Time spent with patient: 15 minutes  Date of Admission:  08/21/2019 Date of Discharge: 08/25/19  Reason for Admission:  suicidal ideation  Principal Problem: Severe recurrent major depressive disorder with psychotic features [facility]) Discharge Diagnoses: Principal Problem:   Severe recurrent major depressive disorder with psychotic features (HCC) Active Problems:   MDD (major depressive disorder)   Past Psychiatric History: No prior psychiatric admissions, history of intermittent depressive episodes over a period of years. One prior suicidal attempt by CO poisoning 20 years ago. Denies history of mania or hypomania. No prior history of psychosis.  No prior history of alcohol abuse.  Past Medical History:  Past Medical History:  Diagnosis Date  . Hypertension     Past Surgical History:  Procedure Laterality Date  . EYE SURGERY     cateract surgery 20 yrs ago   Family History: History reviewed. No pertinent family history. Family Psychiatric  History: Reports history of depression, alcohol use disorder in extended family. No suicides in family. Social History:  Social History   Substance and Sexual Activity  Alcohol Use Yes  . Alcohol/week: 2.0 standard drinks  . Types: 2 Cans of beer per week   Comment: occassional     Social History   Substance and Sexual Activity  Drug Use Never    Social History   Socioeconomic History  . Marital status: Divorced    Spouse name: Not on file  . Number of children: Not on file  . Years of education: Not on file  . Highest education level: Not on file  Occupational History  . Not on file  Social Needs  . Financial resource strain: Not on file  . Food insecurity    Worry: Not on file    Inability: Not on file  .  Transportation needs    Medical: Not on file    Non-medical: Not on file  Tobacco Use  . Smoking status: Never Smoker  . Smokeless tobacco: Never Used  Substance and Sexual Activity  . Alcohol use: Yes    Alcohol/week: 2.0 standard drinks    Types: 2 Cans of beer per week    Comment: occassional  . Drug use: Never  . Sexual activity: Not on file  Lifestyle  . Physical activity    Days per week: Not on file    Minutes per session: Not on file  . Stress: Not on file  Relationships  . Social Musician on phone: Not on file    Gets together: Not on file    Attends religious service: Not on file    Active member of club or organization: Not on file    Attends meetings of clubs or organizations: Not on file    Relationship status: Not on file  Other Topics Concern  . Not on file  Social History Narrative  . Not on file    Hospital Course:  From admission H&P: 67 y old male. Single, three adult children, was living alone up to recently when one of his sons moved in with him, works as Naval architect. He presented voluntarily due to worsening depression. Endorses neuro-vegetative symptoms of depression ( poor appetite, poor sleep, low energy level, some anhedonia) and suicidal ideations without specific plan, but states "  was thinking of making it look like an accident, had recently transferred title of vehicle to son and had made up a will . Reports infrequent/ occasional fleeting hallucinations over the last few months  ( brief visual perception of a person or object) which he attributes to periods of feeling particularly tired or fatigued. No other psychotic symptoms. Does not currently endorse any clear precipitating factor contributing to worsening depression but reports recent death by suicide of a friend. Had recently been started on Celexa by PCP but stopped taking it about a week ago due to side effects. ( reports worsening insomnia and increased sweating on it ). No other  psychiatric medications in the past. No prior history of alcohol abuse, does report he had been drinking more over the last 2-3 weeks or so, but denies excessive drinking ( 2-3 beers a few times a week). Denies drug abuse. Admission BAL and UDS negative.  [name] was admitted for depression with suicidal ideation. [facility]. He was started on Zoloft for depression and Tegretol for tinnitus. He participated in group therapy on the unit. He responded well to treatment with no adverse effects reported. He identified his family, particularly his grandchildren as reasons to live and protective factors from suicide. He stated he had realized that his death would place his grandchildren at increased risk for depression and suicide and did not want to place his family at risk. On day of discharge, he presents with euthymic affect and reports good mood. He denies any SI/HI/AVH and contracts for safety. He is future-oriented and states intent to continue outpatient treatment after discharge. He denies withdrawal symptoms. Denies medication side effects. His son recently moved in with him; collateral information was obtained from the son, who denied safety concerns for discharge. He is discharging on the medications listed below. He agrees to [facility] (see below). He is provided with prescriptions for medications upon discharge. Patient states understanding to follow up with outpatient provider for labwork. [facility], and he is discharging home via personal vehicle.  *Patient will need outpatient labwork- Tegretol level, CBC diff, and LFTs  Physical Findings: AIMS: Facial and Oral Movements Muscles of Facial Expression: None, normal Lips and Perioral Area: None, normal Jaw: None, normal Tongue: None, normal,Extremity Movements Upper (arms, wrists, hands, fingers): None, normal Lower (legs, knees, ankles, toes): None, normal,  Trunk Movements Neck, shoulders, hips: None, normal, Overall Severity Severity of abnormal movements (highest score from questions above): None, normal Incapacitation due to abnormal movements: None, normal Patient's awareness of abnormal movements (rate only patient's report): No Awareness, Dental Status Current problems with teeth and/or dentures?: No Does patient usually wear dentures?: No  CIWA:    COWS:     Musculoskeletal: Strength & Muscle Tone: within normal limits Gait & Station: normal Patient leans: N/A  Psychiatric Specialty Exam: Physical Exam  Nursing note and vitals reviewed. Constitutional: He is oriented to person, place, and time. He appears well-developed and well-nourished.  Cardiovascular: Normal rate.  Respiratory: Effort normal.  Neurological: He is alert and oriented to person, place, and time.    Review of Systems  Constitutional: Negative.   Respiratory: Negative for cough and shortness of breath.   Cardiovascular: Negative for chest pain.  Gastrointestinal: Negative for abdominal pain, diarrhea, nausea and vomiting.  Neurological: Negative for tremors, sensory change and headaches.  Psychiatric/Behavioral: Positive for depression (stable on medication). Negative  for hallucinations, substance abuse and suicidal ideas. The patient is not nervous/anxious and does not have insomnia.     Blood pressure 117/77, pulse 65, temperature 98.2 F (36.8 C), temperature source Oral, resp. rate 16, height 5\' 10"  (1.778 m), weight 97.5 kg, SpO2 97 %.Body mass index is 30.85 kg/m.  See MD's discharge SRA      Has this patient used any form of tobacco in the last 30 days? (Cigarettes, Smokeless Tobacco, Cigars, and/or Pipes)  No  Blood Alcohol level:  Lab Results  Component Value Date   ETH <10 08/22/2019    Metabolic Disorder Labs:  Lab Results  Component Value Date   HGBA1C 5.3 08/22/2019   MPG 105.41 08/22/2019   No results found for: PROLACTIN Lab  Results  Component Value Date   CHOL 136 08/22/2019   TRIG 98 08/22/2019   HDL 48 08/22/2019   CHOLHDL 2.8 08/22/2019   VLDL 20 08/22/2019   LDLCALC 68 08/22/2019    See Psychiatric Specialty Exam and Suicide Risk Assessment completed by Attending Physician prior to discharge.  Discharge destination:  Home  Is patient on multiple antipsychotic therapies at discharge:  No   Has Patient had three or more failed trials of antipsychotic monotherapy by history:  No  Recommended Plan for Multiple Antipsychotic Therapies: NA  Discharge Instructions    Discharge instructions   Complete by: As directed    Patient is instructed to take all prescribed medications as recommended. Report any side effects or adverse reactions to your outpatient psychiatrist. Patient is instructed to abstain from alcohol and illegal drugs while on prescription medications. In the event of worsening symptoms, patient is instructed to call the crisis hotline, 911, or go to the nearest emergency department for evaluation and treatment.     Allergies as of 08/25/2019   No Known Allergies     Medication List    STOP taking these medications   citalopram 20 MG tablet Commonly known as: CELEXA   traMADol 50 MG tablet Commonly known as: ULTRAM     TAKE these medications     Indication  carbamazepine 100 MG chewable tablet Commonly known as: TEGRETOL Chew 1 tablet (100 mg total) by mouth 2 (two) times daily.  Indication: Tinnitis   lisinopril 40 MG tablet Commonly known as: ZESTRIL Take 40 mg by mouth daily.  Indication: High Blood Pressure Disorder   sertraline 50 MG tablet Commonly known as: ZOLOFT Take 1 tablet (50 mg total) by mouth daily. Start taking on: August 26, 2019  Indication: Major Depressive Disorder   simvastatin 20 MG tablet Commonly known as: ZOCOR Take 20 mg by mouth at bedtime.  Indication: High Amount of Fats in the Blood      Follow-up Information    [facility].   Why: Therapy with Marin RobertsJulia Hall is Thursday, 9/10 at 3:30p.  Please wear a mask to your appointment.  Contact information: [address] ph: [phone]) [phone] fx: [phone]          Follow-up recommendations: Follow up with outpatient provider for labwork- Tegretol level, CBC with differential, and liver function enzymes. Activity as tolerated. Diet as recommended by primary care physician. Keep all scheduled follow-up appointments as recommended.  Comments:   Patient is instructed to take all prescribed medications as recommended. Report any side effects or adverse reactions to your outpatient psychiatrist. Patient is instructed to abstain from alcohol and illegal drugs while on prescription medications.  In the event of worsening symptoms, patient is instructed to call the crisis hotline, 911, or go to the nearest emergency department for evaluation and treatment.  Signed: Connye Burkitt, NP 08/25/2019, 8:06 AM

## 2019-08-25 NOTE — Progress Notes (Signed)
Pt denied SI/HI. Pt received both written and verbal discharge instructions. Pt verbalized understanding of the discharge instructions. Pt agreed to f/u appt and med regimen. Pt made aware that he is to have his tegretol level checked at his upcoming f/u appt. Pt received an AVS, transitional record and a SRA. The pt gathered his belongings from him room and locker. The Pt was safely discharged to the lobby. Pt stated that his car was in the parking lot and he will drive himself back home to [place].

## 2019-08-25 NOTE — [facility] Suicide Risk Assessment (Signed)
[facility] Discharge Suicide Risk Assessment   Principal Problem: Severe recurrent major depressive disorder with psychotic features [facility]) Discharge Diagnoses: Principal Problem:   Severe recurrent major depressive disorder with psychotic features ([place]) Active Problems:   MDD (major depressive disorder)   Total Time spent with patient: 15 minutes  Musculoskeletal: Strength & Muscle Tone: within normal limits Gait & Station: normal Patient leans: N/A  Psychiatric Specialty Exam: Review of Systems  HENT: Negative for ear discharge.   All other systems reviewed and are negative.   Blood pressure 117/77, pulse 65, temperature 98.2 F (36.8 C), temperature source Oral, resp. rate 16, height 5\' 10"  (1.778 m), weight 97.5 kg, SpO2 97 %.Body mass index is 30.85 kg/m.  General Appearance: Casual  Eye Contact::  Good  Speech:  Normal Rate409  Volume:  Normal  Mood:  Euthymic  Affect:  Congruent  Thought Process:  Coherent and Descriptions of Associations: Intact  Orientation:  Full (Time, Place, and Person)  Thought Content:  Logical  Suicidal Thoughts:  No  Homicidal Thoughts:  No  Memory:  Immediate;   Good Recent;   Good Remote;   Good  Judgement:  Good  Insight:  Good  Psychomotor Activity:  Normal  Concentration:  Good  Recall:  Good  Fund of Knowledge:Good  Language: Good  Akathisia:  Negative  Handed:  Right  AIMS (if indicated):     Assets:  Communication Skills Desire for Improvement Housing Resilience Social Support Talents/Skills  Sleep:  Number of Hours: 6  Cognition: WNL  ADL's:  Intact   Mental Status Per Nursing Assessment::   On Admission:  Suicidal ideation indicated by patient  Demographic Factors:  Male, Age 65 or older, Divorced or widowed and Caucasian  Loss Factors: NA  Historical Factors: Impulsivity  Risk Reduction Factors:   Sense of responsibility to family, Living with another person, especially a relative and Positive social  support  Continued Clinical Symptoms:  Depression:   Impulsivity  Cognitive Features That Contribute To Risk:  None    Suicide Risk:  Minimal: No identifiable suicidal ideation.  Patients presenting with no risk factors but with morbid ruminations; may be classified as minimal risk based on the severity of the depressive symptoms  Follow-up [facility] Follow up on 08/29/2019.   Why: Therapy with Ebbie Latus is Thursday, 9/10 at 3:30p.  Please wear a mask to your appointment.  Contact information: [address] ph: [phone] fx: [phone]          Plan Of Care/Follow-up recommendations:  Activity:  ad lib  Sharma Covert, MD 08/25/2019, 7:42 AM

## 2019-08-25 NOTE — Progress Notes (Signed)
Patient has been up and active on the unit, attended group this evening and has voiced no complaints. He reports being appreciative of the staff and commented on how nice everyone has been to him.Patient currently denies having pain, -si/hi/a/v hall. Support and encouragement offered, safety maintained on unit, will continue to monitor.

## 2019-08-25 NOTE — Progress Notes (Signed)
  [facility] Adult Case Management Discharge Plan :  Will you be returning to the same living situation after discharge:  Yes,  home with son in [place] At discharge, do you have transportation home?: Yes,  patient has how own car in the [facility] Do you have the ability to pay for your medications: Yes,  private insurance  Release of information consent forms completed and in the chart;  Patient's signature needed at discharge.  Patient to Follow up at: Follow-up Information    [facility] Follow up on 08/29/2019.   Why: Therapy with Ebbie Latus is Thursday, 9/10 at 3:30p.  Please wear a mask to your appointment. Also, please talk to your PCP about obtaining lab work.  Contact information: [address] ph: [phone] fx: [phone]          Next level of care provider has access to [place] and Suicide Prevention discussed: Yes,  pt's son     Has patient been referred to the Quitline?: N/A patient is not a smoker  Patient has been referred for addiction treatment: Yes  Trecia Rogers, LCSW 08/25/2019, 9:10 AM

## 2019-08-25 NOTE — [facility] Suicide Risk Assessment (Signed)
[place] INPATIENT:  Family/Significant Other Suicide Prevention Education  Suicide Prevention Education:  Education Completed, Pt's son, Jacarius Handel, has been identified by the patient as the family member/significant other with whom the patient will be residing, and identified as the person(s) who will aid the patient in the event of a mental health crisis (suicidal ideations/suicide attempt).  With written consent from the patient, the family member/significant other has been provided the following suicide prevention education, prior to the and/or following the discharge of the patient.  The suicide prevention education provided includes the following:  Suicide risk factors  Suicide prevention and interventions  National Suicide Hotline telephone number  [facility] assessment telephone number  [facility] Emergency Assistance [place] and/or Residential Mobile Crisis Unit telephone number  Request made of family/significant other to:  Remove weapons (e.g., guns, rifles, knives), all items previously/currently identified as safety concern.    Remove drugs/medications (over-the-counter, prescriptions, illicit drugs), all items previously/currently identified as a safety concern.  The family member/significant other verbalizes understanding of the suicide prevention education information provided.  The family member/significant other agrees to remove the items of safety concern listed above.   CSW contacted pt's son, Ahaan Zobrist. Pt's son stated that he has no concerns or questions regarding his father. He did state that his father has his own car and will be driving himself up to [place] to stay with him.    Trecia Rogers 08/25/2019, 9:06 AM

## 2019-08-25 NOTE — Progress Notes (Signed)
Adult Psychoeducational Group Note  Date:  08/25/2019 Time:  9:28 AM  Group Topic/Focus:  Goals Group:   The focus of this group is to help patients establish daily goals to achieve during treatment and discuss how the patient can incorporate goal setting into their daily lives to aide in recovery.  Participation Level:  Active  Participation Quality:  Appropriate, Attentive and Sharing  Affect:  Appropriate  Cognitive:  Appropriate and Oriented  Insight: Good  Engagement in Group:  Engaged and Supportive  Modes of Intervention:  Discussion, Exploration and Problem-solving  Additional Comments:  Fard set a goal to rely on his son who he listed as a major support.   Shawana Knoch R Coron Rossano 08/25/2019, 9:28 AM

## 2019-09-24 ENCOUNTER — Other Ambulatory Visit: Payer: Self-pay

## 2019-09-24 ENCOUNTER — Encounter (HOSPITAL_COMMUNITY): Payer: Self-pay | Admitting: Psychiatry

## 2019-09-24 ENCOUNTER — Ambulatory Visit (INDEPENDENT_AMBULATORY_CARE_PROVIDER_SITE_OTHER): Payer: 59 | Admitting: Psychiatry

## 2019-09-24 DIAGNOSIS — F331 Major depressive disorder, recurrent, moderate: Secondary | ICD-10-CM

## 2019-09-24 MED ORDER — ZOLPIDEM TARTRATE 5 MG PO TABS: 5.0000 mg | ORAL_TABLET | Freq: Every evening | ORAL | 0 refills | Status: DC | PRN

## 2019-09-24 MED ORDER — CARBAMAZEPINE 100 MG PO CHEW: 100.0000 mg | CHEWABLE_TABLET | Freq: Two times a day (BID) | ORAL | 2 refills | Status: DC

## 2019-09-24 MED ORDER — SERTRALINE HCL 100 MG PO TABS: 100.0000 mg | ORAL_TABLET | Freq: Every day | ORAL | 2 refills | Status: DC

## 2019-09-24 NOTE — Progress Notes (Signed)
BH MD/PA/NP OP Progress Note  09/24/2019 11:44 AM Jake MichaelisJames Michael Port  MRN:  [id number] Interview was conducted using WebEx teleconferencing application but patient could not make camera to work and I verified that I was speaking with the correct person using two identifiers. I discussed the limitations of evaluation and management by telemedicine and  the availability of in person appointments. Patient expressed understanding and agreed to proceed.  Chief Complaint: Some depression, insomnia.  HPI: [age] y old single male (divorced 15 years ago) whose mood gradually declined over past few months to the point of having daily suicidal thoughts. His son became very concerned about "[name]'s" mental health and encouraged him to go to the hospital. [facility]  [facility] and was [facility] on [facility]. 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 He has not been sleeping well and  Started to experience visual hallucinations after drinking some alcohol (he does not abuse alochol or drugs). He had recently been started on Celexa by PCP but stopped taking it about a week ago due to side effects such as worsening insomnia and increased sweating. No other psychiatric medications in the past. This was his first psychiatric admission but he does have a hx of depressive episodes and one, remote suicidal attempt by CO poisoning. In the hospital he was started on sertraline 50 mg which he tolerates well and his mood improved. He is no longer suicidal but his depression has not remitted fully. His son is staying with him and he appreciates it very much. He still has problems with sleep but is hesitant to try sedatives because of the nature of his job.   Visit Diagnosis:    ICD-10-CM   1. Major depressive disorder, recurrent episode, moderate (HCC)  F33.1     Past Psychiatric History: Please see above and hospital  H&P.  Past Medical History:  Past Medical History:  Diagnosis Date  . Hypertension     Past Surgical History:  Procedure Laterality Date  . EYE SURGERY     cateract surgery 20 yrs ago    Family Psychiatric History: Reviewed.  Family History:  Family History  Problem Relation Age of Onset  . Alcohol abuse Maternal Uncle   . Post-traumatic stress disorder Son     Social History:  Social History   Socioeconomic History  . Marital status: Divorced    Spouse name: Not on file  . Number of children: 3  . Years of education: Not on file  . Highest education level: Not on file  Occupational History  . Not on file  Social Needs  . Financial resource strain: Not on file  . Food insecurity    Worry: Not on file    Inability: Not on file  . Transportation needs    Medical: Not on file    Non-medical: Not on file  Tobacco Use  . Smoking status: Never Smoker  . Smokeless tobacco: Never Used  Substance and Sexual Activity  . Alcohol use: Yes    Alcohol/week: 2.0 standard drinks    Types: 2 Cans of beer per week    Comment: occassional  . Drug use: Never  . Sexual activity: Not Currently  Lifestyle  . Physical activity    Days per week: Not on file    Minutes per session: Not on file  . Stress: Not on file  Relationships  .  Social Musician on phone: Not on file    Gets together: Not on file    Attends religious service: Not on file    Active member of club or organization: Not on file    Attends meetings of clubs or organizations: Not on file    Relationship status: Not on file  Other Topics Concern  . Not on file  Social History Narrative  . Not on file    Allergies: No Known Allergies  Metabolic Disorder Labs: Lab Results  Component Value Date   HGBA1C 5.3 08/22/2019   MPG 105.41 08/22/2019   No results found for: PROLACTIN Lab Results  Component Value Date   CHOL 136 08/22/2019   TRIG 98 08/22/2019   HDL 48 08/22/2019   CHOLHDL 2.8  08/22/2019   VLDL 20 08/22/2019   LDLCALC 68 08/22/2019   Lab Results  Component Value Date   TSH 1.679 08/22/2019    Therapeutic Level Labs: No results found for: LITHIUM No results found for: VALPROATE No components found for:  CBMZ  Current Medications: Current Outpatient Medications  Medication Sig Dispense Refill  . tamsulosin (FLOMAX) 0.4 MG CAPS capsule Take 0.4 mg by mouth daily.    . carbamazepine (TEGRETOL) 100 MG chewable tablet Chew 1 tablet (100 mg total) by mouth 2 (two) times daily. 60 tablet 2  . lisinopril (ZESTRIL) 40 MG tablet Take 40 mg by mouth daily.    . sertraline (ZOLOFT) 100 MG tablet Take 1 tablet (100 mg total) by mouth daily. 30 tablet 2  . simvastatin (ZOCOR) 20 MG tablet Take 20 mg by mouth at bedtime.    Marland Kitchen zolpidem (AMBIEN) 5 MG tablet Take 1 tablet (5 mg total) by mouth at bedtime as needed for sleep. 30 tablet 0   No current facility-administered medications for this visit.      Psychiatric Specialty Exam: Review of Systems  HENT: Positive for tinnitus.   Psychiatric/Behavioral: Positive for depression. The patient has insomnia.   All other systems reviewed and are negative.   There were no vitals taken for this visit.There is no height or weight on file to calculate BMI.  General Appearance: NA  Eye Contact:  NA  Speech:  Clear and Coherent and Normal Rate  Volume:  Normal  Mood:  Depressed  Affect:  NA  Thought Process:  Goal Directed and Linear  Orientation:  Full (Time, Place, and Person)  Thought Content: Logical   Suicidal Thoughts:  No  Homicidal Thoughts:  No  Memory:  Immediate;   Good Recent;   Good Remote;   Good  Judgement:  Good  Insight:  Fair  Psychomotor Activity:  NA  Concentration:  Concentration: Good  Recall:  Good  Fund of Knowledge: Good  Language: Good  Akathisia:  Negative  Handed:  Right  AIMS (if indicated): not done  Assets:  Communication Skills Desire for Improvement Financial  Resources/Insurance Housing Physical Health Resilience Social Support Vocational/Educational  ADL's:  Intact  Cognition: WNL  Sleep:  Fair   Screenings: AIMS     [facility] (Dis[facility]  AIMS Total Score  0    AUDIT     Admission (Discharged) from [facility]  Alcohol Use Disorder Identification Test Final Score (AUDIT)  1       Assessment and Plan: 86 y old single male (divorced 15 years ago)  whose mood gradually declined over past few months to the point of having daily suicidal thoughts. His son became very concerned about "[name]'s" mental health and encouraged him to go to the hospital. He  eventually presented to [facility] and was admitted on September 2nd. 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 He has not been sleeping well and  Started to experience visual hallucinations after drinking some alcohol (he does not abuse alochol or drugs). He had recently been started on Celexa by PCP but stopped taking it about a week ago due to side effects such as worsening insomnia and increased sweating. No other psychiatric medications in the past. This was his first psychiatric admission but he does have a hx of depressive episodes and one, remote suicidal attempt by CO poisoning. In the hospital he was started on sertraline 50 mg which he tolerates well and his mood improved. He is no longer suicidal but his depression has not remitted fully. His son is staying with him and he appreciates it very much. He still has problems with sleep but is hesitant to try sedatives because of the nature of his job.   Dx: MDD recurrent, moderate  Plan: We will increase dose of sertraline to 100 mg daily to hopefully achieve full remission of depressive sx. I will add zolpidem 5 mg  prn insomnia. He will continue on carbamazepine for tinnitus (some improvement). Next appointment in 6 weeks. The plan was discussed with patient who had an opportunity to ask questions and these were all answered. I spend 40 minutes in phone consultation with the patient and devoted approximately 50% of this time to explanation of diagnosis, discussion of treatment options and med education.   Stephanie Acre, MD 09/24/2019, 11:44 AM

## 2019-10-19 ENCOUNTER — Other Ambulatory Visit (HOSPITAL_COMMUNITY): Payer: Self-pay | Admitting: Psychiatry

## 2019-11-07 ENCOUNTER — Ambulatory Visit (HOSPITAL_COMMUNITY): Payer: 59 | Admitting: Psychiatry

## 2019-11-07 ENCOUNTER — Other Ambulatory Visit: Payer: Self-pay

## 2019-11-08 ENCOUNTER — Other Ambulatory Visit: Payer: Self-pay

## 2019-11-08 ENCOUNTER — Ambulatory Visit (INDEPENDENT_AMBULATORY_CARE_PROVIDER_SITE_OTHER): Payer: 59 | Admitting: Psychiatry

## 2019-11-08 DIAGNOSIS — F3341 Major depressive disorder, recurrent, in partial remission: Secondary | ICD-10-CM | POA: Diagnosis not present

## 2019-11-08 MED ORDER — ZOLPIDEM TARTRATE 5 MG PO TABS: 5.0000 mg | ORAL_TABLET | Freq: Every evening | ORAL | 2 refills | Status: DC | PRN

## 2019-11-08 NOTE — Progress Notes (Signed)
[place] MD/PA/NP OP Progress Note  11/08/2019 8:53 AM Gilmore List  MRN:  [id number] Interview was conducted by phone and I verified that I was speaking with the correct person using two identifiers. I discussed the limitations of evaluation and management by telemedicine and  the availability of in person appointments. Patient expressed understanding and agreed to proceed.  Chief Complaint: Night terrors.  HPI: 7 y old single male (divorced 15 years ago) whose mood gradually declined over past few months to the point of having daily suicidal thoughts. His son became very concerned about "Kyle Mccarthy's" mental health and encouraged him to go to the hospital. He  eventually presented to [facility] and was admitted on September 2nd. 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 He has not been sleeping well and  Started to experience visual hallucinations after drinking some alcohol (he does not abuse alochol or drugs). He had recently been started on Celexa by PCP but stopped taking it about a week ago due to side effects such as worsening insomnia and increased sweating. No other psychiatric medications in the past.This was his first psychiatric admission but he does have a hx of depressive episodes and one, remote suicidal attempt by CO poisoning. In the hospital he was started on sertraline 50 mg which he tolerates well and his mood improved. He is no longer suicidal but his depression has not remitted fully. His son is staying with him on and off and he appreciates it very much. He has tried zolpidem for insomnia and it works well. We have increased dose of sertraline to 100 mg and depression has lifted. He does experience occasional disturbing vivid dreams and nighttime sweats but because of mood improvement he wants to continue on it unchanged.    Visit Diagnosis:    ICD-10-CM   1. Major depressive disorder,  recurrent episode, in partial remission (HCC)  F33.41     Past Psychiatric History: Please see intake H&P.  Past Medical History:  Past Medical History:  Diagnosis Date  . Hypertension     Past Surgical History:  Procedure Laterality Date  . EYE SURGERY     cateract surgery 20 yrs ago    Family Psychiatric History: Reviewed.  Family History:  Family History  Problem Relation Age of Onset  . Alcohol abuse Maternal Uncle   . Post-traumatic stress disorder Son     Social History:  Social History   Socioeconomic History  . Marital status: Divorced    Spouse name: Not on file  . Number of children: 3  . Years of education: Not on file  . Highest education level: Not on file  Occupational History  . Not on file  Social Needs  . Financial resource strain: Not on file  . Food insecurity    Worry: Not on file    Inability: Not on file  . Transportation needs    Medical: Not on file    Non-medical: Not on file  Tobacco Use  . Smoking status: Never Smoker  . Smokeless tobacco: Never Used  Substance and Sexual Activity  . Alcohol use: Yes    Alcohol/week: 2.0 standard drinks    Types: 2 Cans of beer per week    Comment: occassional  . Drug use: Never  . Sexual activity: Not Currently  Lifestyle  . Physical activity    Days per week: Not on  file    Minutes per session: Not on file  . Stress: Not on file  Relationships  . Social Musician on phone: Not on file    Gets together: Not on file    Attends religious service: Not on file    Active member of club or organization: Not on file    Attends meetings of clubs or organizations: Not on file    Relationship status: Not on file  Other Topics Concern  . Not on file  Social History Narrative  . Not on file    Allergies: No Known Allergies  Metabolic Disorder Labs: Lab Results  Component Value Date   HGBA1C 5.3 08/22/2019   MPG 105.41 08/22/2019   No results found for: PROLACTIN Lab Results   Component Value Date   CHOL 136 08/22/2019   TRIG 98 08/22/2019   HDL 48 08/22/2019   CHOLHDL 2.8 08/22/2019   VLDL 20 08/22/2019   LDLCALC 68 08/22/2019   Lab Results  Component Value Date   TSH 1.679 08/22/2019    Therapeutic Level Labs: No results found for: LITHIUM No results found for: VALPROATE No components found for:  CBMZ  Current Medications: Current Outpatient Medications  Medication Sig Dispense Refill  . carbamazepine (TEGRETOL) 100 MG chewable tablet Chew 1 tablet (100 mg total) by mouth 2 (two) times daily. 60 tablet 2  . lisinopril (ZESTRIL) 40 MG tablet Take 40 mg by mouth daily.    . sertraline (ZOLOFT) 100 MG tablet TAKE 1 TABLET BY MOUTH EVERY DAY 90 tablet 1  . simvastatin (ZOCOR) 20 MG tablet Take 20 mg by mouth at bedtime.    . tamsulosin (FLOMAX) 0.4 MG CAPS capsule Take 0.4 mg by mouth daily.    Marland Kitchen zolpidem (AMBIEN) 5 MG tablet Take 1 tablet (5 mg total) by mouth at bedtime as needed for sleep. 30 tablet 2   No current facility-administered medications for this visit.      Psychiatric Specialty Exam: Review of Systems  Psychiatric/Behavioral: The patient has insomnia.   All other systems reviewed and are negative.   There were no vitals taken for this visit.There is no height or weight on file to calculate BMI.  General Appearance: NA  Eye Contact:  NA  Speech:  Clear and Coherent and Normal Rate  Volume:  Normal  Mood:  Euthymic  Affect:  NA  Thought Process:  Goal Directed and Linear  Orientation:  Full x 4  Thought Content: Logical   Suicidal Thoughts:  No  Homicidal Thoughts:  No  Memory:  Immediate;   Good Recent;   Good Remote;   Good  Judgement:  Good  Insight:  Good  Psychomotor Activity:  NA  Concentration:  Concentration: Good  Recall:  Good  Fund of Knowledge: Good  Language: Good  Akathisia:  Negative  Handed:  Right  AIMS (if indicated): not done  Assets:  Communication Skills Desire for Improvement Financial  Resources/Insurance Housing Physical Health Social Support Talents/Skills  ADL's:  Intact  Cognition: WNL  Sleep:  Fair   Screenings: AIMS     Ad[facility] (Discharged) [facility]  AIMS Total Score  0    AUDIT     Admission (Discharged) from [facility]  Alcohol Use Disorder Identification Test Final Score (AUDIT)  1       Assessment and Plan: 79 y old  single male (divorced 15 years ago) whose mood gradually declined over past few months to the point of having daily suicidal thoughts. His son became very concerned about "Kyle Mccarthy's" mental health and encouraged him to go to the hospital. [facility]. 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 He has not been sleeping well and  Started to experience visual hallucinations after drinking some alcohol (he does not abuse alochol or drugs). He had recently been started on Celexa by PCP but stopped taking it about a week ago due to side effects such as worsening insomnia and increased sweating. No other psychiatric medications in the past.This was his first psychiatric admission but he does have a hx of depressive episodes and one, remote suicidal attempt by CO poisoning. In the hospital he was started on sertraline 50 mg which he tolerates well and his mood improved. He is no longer suicidal but his depression has not remitted fully. His son is staying with him on and off and he appreciates it very much. He has tried zolpidem for insomnia and it works well. We have increased dose of sertraline to 100 mg and depression has lifted. He does experience occasional disturbing vivid dreams and nighttime sweats but because of mood improvement he wants to continue on it unchanged.   Dx:  MDD recurrent,in partial remission  Plan: We will continue sertraline 100 mg daily and zolpidem 5 mg prn insomnia. He will continue on carbamazepine for tinnitus (some improvement). Next appointment in 8 weeks. The plan was discussed with patient who had an opportunity to ask questions and these were all answered. I spend 25 minutes in phone consultation with the patient    Magdalene Patricialgierd A Almira [place], MD 11/08/2019, 8:53 AM

## 2020-01-08 ENCOUNTER — Ambulatory Visit (INDEPENDENT_AMBULATORY_CARE_PROVIDER_SITE_OTHER): Payer: 59 | Admitting: Psychiatry

## 2020-01-08 ENCOUNTER — Other Ambulatory Visit: Payer: Self-pay

## 2020-01-08 ENCOUNTER — Other Ambulatory Visit (HOSPITAL_COMMUNITY): Payer: Self-pay | Admitting: Psychiatry

## 2020-01-08 DIAGNOSIS — F3341 Major depressive disorder, recurrent, in partial remission: Secondary | ICD-10-CM

## 2020-01-08 MED ORDER — CARBAMAZEPINE 100 MG PO CHEW: 100.0000 mg | CHEWABLE_TABLET | Freq: Two times a day (BID) | ORAL | 2 refills | Status: DC

## 2020-01-08 MED ORDER — ZOLPIDEM TARTRATE 10 MG PO TABS: 10.0000 mg | ORAL_TABLET | Freq: Every evening | ORAL | 2 refills | Status: DC | PRN

## 2020-01-08 NOTE — Progress Notes (Signed)
BH MD/PA/NP OP Progress Note  01/08/2020 3:50 PM Kyle Mccarthy  MRN:  [id number]  Interview was conducted by phone and I verified that I was speaking with the correct person using two identifiers. I discussed the limitations of evaluation and management by telemedicine and  the availability of in person appointments. Patient expressed understanding and agreed to proceed.  Chief Complaint: "I am doing OK".  HPI: 15 y oldsinglemale(divorced 15 years ago) whose mood gradually declined over past few months to the point of having daily suicidal thoughts. His son became very concerned about "Kyle Mccarthy's" mental health and encouraged him to go to the hospital. He eventually presented to [facility] and was admitted on September 2nd. 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 He has not been sleeping well and Started to experience visual hallucinations after drinking some alcohol (he does not abuse alochol or drugs). He had recently been started on Celexa by PCP but stopped taking it about a week ago due to side effectssuch asworsening insomnia and increased sweating. No other psychiatric medications in the past.This was his first psychiatric admission but he does have a hx of depressive episodes and one, remote suicidal attempt by CO poisoning.In the hospital he was started on sertraline 50 mg which he tolerates well and his mood improved. He is no longer suicidal but his depression has not remitted fully. His son is staying with him on and off and he appreciates it very much. He has tried zolpidem for insomnia and it works but not very well. We have increased dose of sertraline to 100 mg and depression has lifted. His son went back to him home and Kyle Mccarthy spend holidays alone. It was not a happy time but he got through it - he feels that without a medication if would have been very difficult. He is not feeling  hopeless or suicidal. Tinnitus returned and he would like to discontinue carbamazepine as it it does not seem to be helpful.   Visit Diagnosis:    ICD-10-CM   1. Major depressive disorder, recurrent episode, in partial remission (HCC)  F33.41     Past Psychiatric History: Please see intake H&P.  Past Medical History:  Past Medical History:  Diagnosis Date  . Hypertension     Past Surgical History:  Procedure Laterality Date  . EYE SURGERY     cateract surgery 20 yrs ago    Family Psychiatric History: Reviewed.  Family History:  Family History  Problem Relation Age of Onset  . Alcohol abuse Maternal Uncle   . Post-traumatic stress disorder Son     Social History:  Social History   Socioeconomic History  . Marital status: Divorced    Spouse name: Not on file  . Number of children: 3  . Years of education: Not on file  . Highest education level: Not on file  Occupational History  . Not on file  Tobacco Use  . Smoking status: Never Smoker  . Smokeless tobacco: Never Used  Substance and Sexual Activity  . Alcohol use: Yes    Alcohol/week: 2.0 standard drinks    Types: 2 Cans of beer per week    Comment: occassional  . Drug use: Never  . Sexual activity: Not Currently  Other Topics Concern  . Not on file  Social History Narrative  . Not on file   Social Determinants of Health  Financial Resource Strain:   . Difficulty of Paying Living Expenses: Not on file  Food Insecurity:   . Worried About Programme researcher, broadcasting/film/video in the Last Year: Not on file  . Ran Out of Food in the Last Year: Not on file  Transportation Needs:   . Lack of Transportation (Medical): Not on file  . Lack of Transportation (Non-Medical): Not on file  Physical Activity:   . Days of Exercise per Week: Not on file  . Minutes of Exercise per Session: Not on file  Stress:   . Feeling of Stress : Not on file  Social Connections:   . Frequency of Communication with Friends and Family: Not on  file  . Frequency of Social Gatherings with Friends and Family: Not on file  . Attends Religious Services: Not on file  . Active Member of Clubs or Organizations: Not on file  . Attends Banker Meetings: Not on file  . Marital Status: Not on file    Allergies: No Known Allergies  Metabolic Disorder Labs: Lab Results  Component Value Date   HGBA1C 5.3 08/22/2019   MPG 105.41 08/22/2019   No results found for: PROLACTIN Lab Results  Component Value Date   CHOL 136 08/22/2019   TRIG 98 08/22/2019   HDL 48 08/22/2019   CHOLHDL 2.8 08/22/2019   VLDL 20 08/22/2019   LDLCALC 68 08/22/2019   Lab Results  Component Value Date   TSH 1.679 08/22/2019    Therapeutic Level Labs: No results found for: LITHIUM No results found for: VALPROATE No components found for:  CBMZ  Current Medications: Current Outpatient Medications  Medication Sig Dispense Refill  . lisinopril (ZESTRIL) 40 MG tablet Take 40 mg by mouth daily.    . sertraline (ZOLOFT) 100 MG tablet TAKE 1 TABLET BY MOUTH EVERY DAY 90 tablet 1  . simvastatin (ZOCOR) 20 MG tablet Take 20 mg by mouth at bedtime.    . tamsulosin (FLOMAX) 0.4 MG CAPS capsule Take 0.4 mg by mouth daily.    Marland Kitchen zolpidem (AMBIEN) 10 MG tablet Take 1 tablet (10 mg total) by mouth at bedtime as needed for sleep. 30 tablet 2   No current facility-administered medications for this visit.    Psychiatric Specialty Exam: Review of Systems  HENT: Positive for tinnitus.   Psychiatric/Behavioral: Positive for sleep disturbance.  All other systems reviewed and are negative.   There were no vitals taken for this visit.There is no height or weight on file to calculate BMI.  General Appearance: NA  Eye Contact:  NA  Speech:  Clear and Coherent and Normal Rate  Volume:  Normal  Mood:  Mild depression (situational).  Affect:  NA  Thought Process:  Goal Directed and Linear  Orientation:  Full (Time, Place, and Person)  Thought Content:  Logical   Suicidal Thoughts:  No  Homicidal Thoughts:  No  Memory:  Immediate;   Good Recent;   Good Remote;   Good  Judgement:  Good  Insight:  Good  Psychomotor Activity:  NA  Concentration:  Concentration: Good  Recall:  Good  Fund of Knowledge: Good  Language: Good  Akathisia:  Negative  Handed:  Right  AIMS (if indicated): not done  Assets:  Communication Skills Desire for Improvement Financial Resources/Insurance Housing Resilience Talents/Skills Transportation  ADL's:  Intact  Cognition: WNL  Sleep:  Fair   Screenings: AIMS     Ad[facility] (Discharged) [facility]  AIMS Total Score  0    AUDIT     Admission (Discharged) from OP Visit from 08/21/2019 in [place] [facility]  Alcohol Use Disorder Identification Test Final Score (AUDIT)  1       Assessment and Plan: 49 y oldsinglemale(divorced 15 years ago) whose mood gradually declined over past few months to the point of having daily suicidal thoughts. His son became very concerned about "Kyle Mccarthy's" mental health and encouraged him to go to the hospital. He eventually presented to [facility] and was admitted on September 2nd. 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 He has not been sleeping well and Started to experience visual hallucinations after drinking some alcohol (he does not abuse alochol or drugs). He had recently been started on Celexa by PCP but stopped taking it about a week ago due to side effectssuch asworsening insomnia and increased sweating. No other psychiatric medications in the past.This was his first psychiatric admission but he does have a hx of depressive episodes and one, remote suicidal attempt by CO poisoning.In the hospital he was started on sertraline 50 mg which he tolerates well and his mood improved. He is  no longer suicidal but his depression has not remitted fully. His son is staying with him on and off and he appreciates it very much. He has tried zolpidem for insomnia and it works but not very well. We have increased dose of sertraline to 100 mg and depression has lifted. His son went back to him home and [name] spend holidays alone. It was not a happy time but he got through it - he feels that without a medication if would have been very difficult. He is not feeling hopeless or suicidal. Tinnitus returned and he would like to discontinue carbamazepine as it it does not seem to be helpful.   Dx: MDD recurrent, in partial remission  Plan: We will continue sertraline 100 mg daily and increase zolpidem to 10 mg prn insomnia. He will discontinue carbamazepine for tinnitus (not effective). Next appointment in 8 weeks.The plan was discussed with patient who had an opportunity to ask questions and these were all answered. I spend 8minutes in phone consultation with the patient     Stephanie Acre, MD 01/08/2020, 3:50 PM

## 2020-03-09 ENCOUNTER — Other Ambulatory Visit: Payer: Self-pay

## 2020-03-09 ENCOUNTER — Ambulatory Visit (INDEPENDENT_AMBULATORY_CARE_PROVIDER_SITE_OTHER): Payer: 59 | Admitting: Psychiatry

## 2020-03-09 DIAGNOSIS — F3341 Major depressive disorder, recurrent, in partial remission: Secondary | ICD-10-CM

## 2020-03-09 MED ORDER — ZOLPIDEM TARTRATE 10 MG PO TABS: 10.0000 mg | ORAL_TABLET | Freq: Every evening | ORAL | 2 refills | Status: DC | PRN

## 2020-03-09 MED ORDER — SERTRALINE HCL 100 MG PO TABS: 100.0000 mg | ORAL_TABLET | Freq: Every day | ORAL | 1 refills | Status: DC

## 2020-03-09 NOTE — Progress Notes (Signed)
BH MD/PA/NP OP Progress Note  03/09/2020 3:51 PM Kyle Mccarthy  MRN:  [id number] Interview was conducted by phone and I verified that I was speaking with the correct person using two identifiers. I discussed the limitations of evaluation and management by telemedicine and  the availability of in person appointments. Patient expressed understanding and agreed to proceed.  Chief Complaint: "I am still doing OK and sleep is much better".  HPI: 34 y oldsinglemale(divorced 15 years ago) whose mood gradually declined over past few months to the point of having daily suicidal thoughts. His son became very concerned about "Kyle Mccarthy's" mental health and encouraged him to go to the hospital. He eventually presented to [facility] and was admitted on September 2nd. 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 He has not been sleeping well and Started to experience visual hallucinations after drinking some alcohol (he does not abuse alochol or drugs). He had recently been started on Celexa by PCP but stopped taking it about a week ago due to side effectssuch asworsening insomnia and increased sweating. No other psychiatric medications in the past.This was his first psychiatric admission but he does have a hx of depressive episodes and one, remote suicidal attempt by CO poisoning.In the hospital he was started on sertraline 50 mg which he tolerates well and his mood improved. He is no longer suicidal but his depression has not remitted fully. His son is staying with himon and offand he appreciates it very much. Hehas tried zolpidem for insomnia and it works but not very well. We have increased dose of sertraline to 100 mg and depression has lifted. His son went back to him home and Kyle Mccarthy spend holidays alone. It was not a happy time but he got through it - he feels that without a medication if would have been very  difficult. He is not feeling hopeless or suicidal. Lack of social contacts remains a main obstacle to full euthymia. Kyle Mccarthy will get his COVID vaccine this week.   Visit Diagnosis:    ICD-10-CM   1. Major depressive disorder, recurrent episode, in partial remission (HCC)  F33.41     Past Psychiatric History: Please see intake H&P.  Past Medical History:  Past Medical History:  Diagnosis Date  . Hypertension     Past Surgical History:  Procedure Laterality Date  . EYE SURGERY     cateract surgery 20 yrs ago    Family Psychiatric History: Reviewed.  Family History:  Family History  Problem Relation Age of Onset  . Alcohol abuse Maternal Uncle   . Post-traumatic stress disorder Son     Social History:  Social History   Socioeconomic History  . Marital status: Divorced    Spouse name: Not on file  . Number of children: 3  . Years of education: Not on file  . Highest education level: Not on file  Occupational History  . Not on file  Tobacco Use  . Smoking status: Never Smoker  . Smokeless tobacco: Never Used  Substance and Sexual Activity  . Alcohol use: Yes    Alcohol/week: 2.0 standard drinks    Types: 2 Cans of beer per week    Comment: occassional  . Drug use: Never  . Sexual activity: Not Currently  Other Topics Concern  . Not on file  Social History Narrative  . Not on file   Social Determinants  of Health   Financial Resource Strain:   . Difficulty of Paying Living Expenses:   Food Insecurity:   . Worried About Charity fundraiser in the Last Year:   . Arboriculturist in the Last Year:   Transportation Needs:   . Film/video editor (Medical):   Marland Kitchen Lack of Transportation (Non-Medical):   Physical Activity:   . Days of Exercise per Week:   . Minutes of Exercise per Session:   Stress:   . Feeling of Stress :   Social Connections:   . Frequency of Communication with Friends and Family:   . Frequency of Social Gatherings with Friends and Family:    . Attends Religious Services:   . Active Member of Clubs or Organizations:   . Attends Archivist Meetings:   Marland Kitchen Marital Status:     Allergies: No Known Allergies  Metabolic Disorder Labs: Lab Results  Component Value Date   HGBA1C 5.3 08/22/2019   MPG 105.41 08/22/2019   No results found for: PROLACTIN Lab Results  Component Value Date   CHOL 136 08/22/2019   TRIG 98 08/22/2019   HDL 48 08/22/2019   CHOLHDL 2.8 08/22/2019   VLDL 20 08/22/2019   LDLCALC 68 08/22/2019   Lab Results  Component Value Date   TSH 1.679 08/22/2019    Therapeutic Level Labs: No results found for: LITHIUM No results found for: VALPROATE No components found for:  CBMZ  Current Medications: Current Outpatient Medications  Medication Sig Dispense Refill  . lisinopril (ZESTRIL) 40 MG tablet Take 40 mg by mouth daily.    . sertraline (ZOLOFT) 100 MG tablet Take 1 tablet (100 mg total) by mouth daily. 90 tablet 1  . simvastatin (ZOCOR) 20 MG tablet Take 20 mg by mouth at bedtime.    . tamsulosin (FLOMAX) 0.4 MG CAPS capsule Take 0.4 mg by mouth daily.    Marland Kitchen zolpidem (AMBIEN) 10 MG tablet Take 1 tablet (10 mg total) by mouth at bedtime as needed for sleep. 30 tablet 2   No current facility-administered medications for this visit.    Psychiatric Specialty Exam: Review of Systems  All other systems reviewed and are negative.   There were no vitals taken for this visit.There is no height or weight on file to calculate BMI.  General Appearance: NA  Eye Contact:  NA  Speech:  Clear and Coherent and Normal Rate  Volume:  Normal  Mood:  Varies.  Affect:  NA  Thought Process:  Goal Directed and Linear  Orientation:  Full (Time, Place, and Person)  Thought Content: Logical   Suicidal Thoughts:  No  Homicidal Thoughts:  No  Memory:  Immediate;   Good Recent;   Good Remote;   Good  Judgement:  Good  Insight:  Good  Psychomotor Activity:  NA  Concentration:  Concentration: Good   Recall:  Good  Fund of Knowledge: Good  Language: Good  Akathisia:  Negative  Handed:  Right  AIMS (if indicated): not done  Assets:  Communication Skills Desire for Improvement Financial Resources/Insurance Housing Physical Health Resilience  ADL's:  Intact  Cognition: WNL  Sleep:  Good   Screenings: AIMS     Admission (Discharged) from OP Visit from 08/21/2019 in [place] [facility]  AIMS Total Score  0    AUDIT     Admission (Discharged) from OP Visit from 08/21/2019 in [place] [facility]  Alcohol Use Disorder Identification Test  Final Score (AUDIT)  1       Assessment and Plan: 43 y oldsinglemale(divorced 15 years ago) whose mood gradually declined over past few months to the point of having daily suicidal thoughts. His son became very concerned about "Kyle Mccarthy's" mental health and encouraged him to go to the hospital. He eventually presented to [facility] and was admitted on September 2nd. 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 He has not been sleeping well and Started to experience visual hallucinations after drinking some alcohol (he does not abuse alochol or drugs). He had recently been started on Celexa by PCP but stopped taking it about a week ago due to side effectssuch asworsening insomnia and increased sweating. No other psychiatric medications in the past.This was his first psychiatric admission but he does have a hx of depressive episodes and one, remote suicidal attempt by CO poisoning.In the hospital he was started on sertraline 50 mg which he tolerates well and his mood improved. He is no longer suicidal but his depression has not remitted fully. His son is staying with himon and offand he appreciates it very much. Hehas tried zolpidem for insomnia and it works but not very well. We have increased dose of sertraline  to 100 mg and depression has lifted. His son went back to him home and Kyle Mccarthy spend holidays alone. It was not a happy time but he got through it - he feels that without a medication if would have been very difficult. He is not feeling hopeless or suicidal. Lack of social contacts remains a main obstacle to full euthymia. His grandchildren are in [place], [place] and in [place] - stays in contact via Facebook. Kyle Mccarthy will get his COVID vaccine this week.  Dx: MDD recurrent, in partial remission  Plan: We willcontinuesertraline 100 mg dailyandincrease zolpidem to 10 mg prn insomnia.The plan was discussed with patient who had an opportunity to ask questions and these were all answered. I spend15minutes in phone consultation with the patient    Magdalene Patricia, MD 03/09/2020, 3:51 PM

## 2020-05-29 ENCOUNTER — Ambulatory Visit (HOSPITAL_COMMUNITY): Payer: 59 | Admitting: Psychiatry

## 2020-08-07 ENCOUNTER — Other Ambulatory Visit (HOSPITAL_COMMUNITY): Payer: Self-pay | Admitting: Psychiatry

## 2021-02-10 ENCOUNTER — Other Ambulatory Visit (HOSPITAL_COMMUNITY): Payer: Self-pay | Admitting: Psychiatry

## 2021-04-13 ENCOUNTER — Telehealth (INDEPENDENT_AMBULATORY_CARE_PROVIDER_SITE_OTHER): Payer: 59 | Admitting: Psychiatry

## 2021-04-13 ENCOUNTER — Encounter (HOSPITAL_COMMUNITY): Payer: Self-pay | Admitting: Psychiatry

## 2021-04-13 ENCOUNTER — Other Ambulatory Visit: Payer: Self-pay

## 2021-04-13 DIAGNOSIS — F3341 Major depressive disorder, recurrent, in partial remission: Secondary | ICD-10-CM | POA: Diagnosis not present

## 2021-04-13 MED ORDER — SERTRALINE HCL 100 MG PO TABS: 1.0000 | ORAL_TABLET | Freq: Every day | ORAL | 1 refills | Status: AC

## 2021-04-13 NOTE — Progress Notes (Signed)
BH MD/PA/NP OP Progress Note  04/13/2021 3:07 PM Kyle Mccarthy  MRN:  [id number] Interview was conducted by phone and I verified that I was speaking with the correct person using two identifiers. I discussed the limitations of evaluation and management by telemedicine and  the availability of in person appointments. Patient expressed understanding and agreed to proceed.  Chief Complaint:  .depression  IEP:PIRJJOA is a 65 y old single male (divorced 15 years ago) with depression who was followed by Dr.Pucilowska who is no longer with [facility]. Patient today reports that his grand daughter has been having health issues. 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 Reports it has been stressful for a while. He is sleeping ok and eating ok. He is not feeling hopeless or suicidal. However he reports chronic suicidal thoughts but will not act them since he would not want his family to suffer. He reports being lonely and his children and grand children do not live close by.  He lives by himself and his dogs. He has considered moving close to one of his children but does not want to show favoritism to any one of them. He does talk to his children daily.    Visit Diagnosis:    ICD-10-CM   1. Major depressive disorder, recurrent episode, in partial remission (HCC)  F33.41     Past Psychiatric History: Please see intake H&P.  Past Medical History:  Past Medical History:  Diagnosis Date   Hypertension     Past Surgical History:  Procedure Laterality Date   EYE SURGERY     cateract surgery 20 yrs ago    Family Psychiatric History: Reviewed.  Family History:  Family History  Problem Relation Age of Onset   Alcohol abuse Maternal Uncle    Post-traumatic stress disorder Son     Social History:  Social History   Socioeconomic History   Marital status: Divorced    Spouse name: Not on file   Number of children: 3   Years of education: Not on file   Highest education level: Not  on file  Occupational History   Not on file  Tobacco Use   Smoking status: Never Smoker   Smokeless tobacco: Never Used  Substance and Sexual Activity   Alcohol use: Yes    Alcohol/week: 2.0 standard drinks    Types: 2 Cans of beer per week    Comment: occassional   Drug use: Never   Sexual activity: Not Currently  Other Topics Concern   Not on file  Social History Narrative   Not on file   Social Determinants of Health   Financial Resource Strain: Not on file  Food Insecurity: Not on file  Transportation Needs: Not on file  Physical Activity: Not on file  Stress: Not on file  Social Connections: Not on file    Allergies: No Known Allergies  Metabolic Disorder Labs: Lab Results  Component Value Date   HGBA1C 5.3 08/22/2019   MPG 105.41 08/22/2019   No results found for: PROLACTIN Lab Results  Component Value Date   CHOL 136 08/22/2019   TRIG 98 08/22/2019   HDL 48 08/22/2019   CHOLHDL 2.8 08/22/2019   VLDL 20 08/22/2019   LDLCALC 68 08/22/2019   Lab Results  Component Value Date   TSH 1.679 08/22/2019    Therapeutic Level Labs: No results found for: LITHIUM No results found for: VALPROATE No components found for:  CBMZ  Current Medications: Current Outpatient Medications  Medication  Sig Dispense Refill   lisinopril (ZESTRIL) 40 MG tablet Take 40 mg by mouth daily.     sertraline (ZOLOFT) 100 MG tablet TAKE 1 TABLET BY MOUTH EVERY DAY 90 tablet 1   simvastatin (ZOCOR) 20 MG tablet Take 20 mg by mouth at bedtime.     tamsulosin (FLOMAX) 0.4 MG CAPS capsule Take 0.4 mg by mouth daily.     zolpidem (AMBIEN) 10 MG tablet Take 1 tablet (10 mg total) by mouth at bedtime as needed for sleep. 30 tablet 2   No current facility-administered medications for this visit.    Psychiatric Specialty Exam: Review of Systems  All other systems reviewed and are negative.   There were no vitals taken for this visit.There is no height or weight on file to calculate  BMI.  General Appearance: NA  Eye Contact:  NA  Speech:  Clear and Coherent and Normal Rate  Volume:  Normal  Mood:  improved  Affect:  NA  Thought Process:  Goal Directed and Linear  Orientation:  Full (Time, Place, and Person)  Thought Content: Logical   Suicidal Thoughts:  No  Homicidal Thoughts:  No  Memory:  Immediate;   Good Recent;   Good Remote;   Good  Judgement:  Good  Insight:  Good  Psychomotor Activity:  NA  Concentration:  Concentration: Good  Recall:  Good  Fund of Knowledge: Good  Language: Good  Akathisia:  Negative  Handed:  Right  AIMS (if indicated): not done  Assets:  Communication Skills Desire for Improvement Financial Resources/Insurance Housing Physical Health Resilience  ADL's:  Intact  Cognition: WNL  Sleep:  Good   Screenings: AIMS    Flowsheet Row Admission (Discharged) [facility]  AIMS Total Score 0      AUDIT    Flowsheet Row Admission (Discharged) from [facility]  Alcohol Use Disorder Identification Test Final Score (AUDIT) 1      Flowsheet Row Admission (Discharged) from [facility]  C-SSRS RISK CATEGORY High Risk        Assessment and Plan: 65 yo male with longstanding depression who is currently stable and compliant with his medications.   Dx: MDD recurrent, in partial remission   Plan: We will continue sertraline 100 mg daily and continue zolpidem to 10 mg prn insomnia. Follow up in 2 months.  The plan was discussed with patient who had an opportunity to ask questions and these were all answered. I spend 25 minutes in phone consultation with the patient. I spent 20 minutes on chart review.    Patrick North, MD 04/13/2021, 3:07 PM

## 2021-05-19 ENCOUNTER — Telehealth (HOSPITAL_COMMUNITY): Payer: Self-pay | Admitting: *Deleted

## 2021-05-19 MED ORDER — ZOLPIDEM TARTRATE 10 MG PO TABS: 10.0000 mg | ORAL_TABLET | Freq: Every evening | ORAL | 0 refills | Status: AC | PRN

## 2021-05-19 NOTE — Telephone Encounter (Signed)
Placed call to patient to advise of cancelled appointment and need to find psychiatric services from another provider.  Patient informed about letter coming with resources.  Patient will need a refill of Ambien.

## 2021-05-19 NOTE — Telephone Encounter (Signed)
A Thirty days bridge supply of Ambien 10 mg as needed is given by Covering MD. Patient must established care with new provider.

## 2021-06-14 ENCOUNTER — Telehealth (HOSPITAL_COMMUNITY): Payer: 59 | Admitting: Psychiatry

## 2022-01-08 ENCOUNTER — Other Ambulatory Visit (HOSPITAL_COMMUNITY): Payer: Self-pay | Admitting: Psychiatry

## 2022-03-09 ENCOUNTER — Ambulatory Visit (HOSPITAL_COMMUNITY)
Admission: RE | Admit: 2022-03-09 | Discharge: 2022-03-09 | Disposition: A | Discharge status: Home / Self Care | Payer: 59 | Source: Ambulatory Visit | Attending: Urology | Admitting: Urology

## 2022-03-09 ENCOUNTER — Other Ambulatory Visit: Payer: Self-pay

## 2022-03-09 ENCOUNTER — Ambulatory Visit: Payer: 59 | Admitting: Urology

## 2022-03-09 VITALS — BP 154/73 | HR 49 | Ht 70.0 in | Wt 250.0 lb

## 2022-03-09 DIAGNOSIS — R1012 Left upper quadrant pain: Secondary | ICD-10-CM | POA: Insufficient documentation

## 2022-03-09 DIAGNOSIS — N200 Calculus of kidney: Secondary | ICD-10-CM | POA: Insufficient documentation

## 2022-03-09 LAB — URINALYSIS, ROUTINE W REFLEX MICROSCOPIC
Appearance Ur: NEGATIVE
Bilirubin, UA: NEGATIVE
Color, UA: NEGATIVE
Glucose, UA: NEGATIVE
Ketones, UA: NEGATIVE
Leukocytes,UA: NEGATIVE
Nitrite, UA: NEGATIVE
Protein,UA: NEGATIVE
RBC, UA: NEGATIVE
Specific Gravity, UA: 1.02 (ref 1.005–1.030)
Urobilinogen, Ur: 1 mg/dL (ref 0.2–1.0)
pH, UA: 7.5 (ref 5.0–7.5)

## 2022-03-09 NOTE — H&P (View-Only) (Signed)
? ?03/09/2022 ?9:09 AM  ? ?Kyle Mccarthy ?09-25-1956 ?[id number] ? ?Referring provider: Lianne Moris, PA-C ?[address] ?[place],  [place] [address] ? ?Nephrolithiasis ? ? ?HPI: ?Kyle Mccarthy is a 66yo here for evaluation of nephrolithiasis. Starting 10 days ago he developed left flank pain that was sharp, intermittent severe and nonraditing. He presented to [facility] on 3/17 and was diagnosed with a 37mm right renal calculus on KUB report. He has had 3 stone events in the past and his last stone events was several years ago. He denies gross hematuria and he denies worsening LUTS. He is currently on flomax 0.4mg  daily. UA today is normal. The left flank pain has improved significantly and is a 2/10. No fevers. No other associated symptoms. No exacerbating events. Narcotics alleviate the pain. CT from today shows a right lower pole calculus and a 4-49mm left distal ureteral calculus.  ? ? ?PMH: ?Past Medical History:  ?Diagnosis Date  ? Hypertension   ? ? ?Surgical History: ?Past Surgical History:  ?Procedure Laterality Date  ? EYE SURGERY    ? cateract surgery 20 yrs ago  ? ? ?Home Medications:  ?Allergies as of 03/09/2022   ? ?   Reactions  ? No Known Allergies   ? ?  ? ?  ?Medication List  ?  ? ?  ? Accurate as of March 09, 2022  9:09 AM. If you have any questions, ask your nurse or doctor.  ?  ?  ? ?  ? ?atorvastatin 40 MG tablet ?Commonly known as: LIPITOR ?Take 40 mg by mouth daily. ?  ?diclofenac Sodium 1 % Gel ?Commonly known as: VOLTAREN ?SMARTSIG:1-4 Gram(s) Topical Twice Daily ?  ?donepezil 5 MG tablet ?Commonly known as: ARICEPT ?Take 5 mg by mouth at bedtime. ?  ?lisinopril 40 MG tablet ?Commonly known as: ZESTRIL ?Take 40 mg by mouth daily. ?  ?ondansetron 8 MG tablet ?Commonly known as: ZOFRAN ?Take 8 mg by mouth 3 (three) times daily. ?  ?sertraline 100 MG tablet ?Commonly known as: ZOLOFT ?Take 1 tablet (100 mg total) by mouth daily. ?  ?simvastatin 20 MG tablet ?Commonly known as: ZOCOR ?Take 20 mg by mouth at  bedtime. ?  ?tamsulosin 0.4 MG Caps capsule ?Commonly known as: FLOMAX ?Take 0.4 mg by mouth daily. ?  ?Xarelto 20 MG Tabs tablet ?Generic drug: rivaroxaban ?Take 20 mg by mouth daily. ?  ?zolpidem 10 MG tablet ?Commonly known as: AMBIEN ?Take 1 tablet (10 mg total) by mouth at bedtime as needed for sleep. ?  ? ?  ? ? ?Allergies:  ?Allergies  ?Allergen Reactions  ? No Known Allergies   ? ? ?Family History: ?Family History  ?Problem Relation Age of Onset  ? Alcohol abuse Maternal Uncle   ? Post-traumatic stress disorder Son   ? ? ?Social History:  reports that he has never smoked. He has never used smokeless tobacco. He reports current alcohol use of about 2.0 standard drinks per week. He reports that he does not use drugs. ? ?ROS: ?All other review of systems were reviewed and are negative except what is noted above in HPI ? ?Physical Exam: ?BP (!) 154/73   Pulse (!) 49   Ht 5\' 10"  (1.778 m)   Wt 250 lb (113.4 kg)   BMI 35.87 kg/m?   ?Constitutional:  Alert and oriented, No acute distress. ?HEENT: [place] AT, moist mucus membranes.  Trachea midline, no masses. ?Cardiovascular: No clubbing, cyanosis, or edema. ?Respiratory: Normal respiratory effort, no increased  work of breathing. ?GI: Abdomen is soft, nontender, nondistended, no abdominal masses ?GU: No CVA tenderness.  ?Lymph: No cervical or inguinal lymphadenopathy. ?Skin: No rashes, bruises or suspicious lesions. ?Neurologic: Grossly intact, no focal deficits, moving all 4 extremities. ?Psychiatric: Normal mood and affect. ? ?Laboratory Data: ?Lab Results  ?Component Value Date  ? WBC 4.5 08/22/2019  ? HGB 13.5 08/22/2019  ? HCT 41.4 08/22/2019  ? MCV 97.0 08/22/2019  ? PLT 133 (L) 08/22/2019  ? ? ?Lab Results  ?Component Value Date  ? CREATININE 1.12 08/22/2019  ? ? ?No results found for: PSA ? ?No results found for: TESTOSTERONE ? ?Lab Results  ?Component Value Date  ? HGBA1C 5.3 08/22/2019  ? ? ?Urinalysis ?No results found for: COLORURINE, APPEARANCEUR,  LABSPEC, PHURINE, GLUCOSEU, HGBUR, BILIRUBINUR, KETONESUR, PROTEINUR, UROBILINOGEN, NITRITE, LEUKOCYTESUR ? ?No results found for: LABMICR, WBCUA, RBCUA, LABEPIT, MUCUS, BACTERIA ? ?Pertinent Imaging: ?KUB ?No results found for this or any previous visit. ? ?No results found for this or any previous visit. ? ?No results found for this or any previous visit. ? ?No results found for this or any previous visit. ? ?No results found for this or any previous visit. ? ?No results found for this or any previous visit. ? ?No results found for this or any previous visit. ? ?No results found for this or any previous visit. ? ? ?Assessment & Plan:   ? ?1. Kidney stones ?-We discussed the management of kidney stones. These options include observation, ureteroscopy, shockwave lithotripsy (ESWL) and percutaneous nephrolithotomy (PCNL). We discussed which options are relevant to the patient's stone(s). We discussed the natural history of kidney stones as well as the complications of untreated stones and the impact on quality of life without treatment as well as with each of the above listed treatments. We also discussed the efficacy of each treatment in its ability to clear the stone burden. With any of these management options I discussed the signs and symptoms of infection and the need for emergent treatment should these be experienced. For each option we discussed the ability of each procedure to clear the patient of their stone burden.  ? ?For observation I described the risks which include but are not limited to silent renal damage, life-threatening infection, need for emergent surgery, failure to pass stone and pain.  ? ?For ureteroscopy I described the risks which include bleeding, infection, damage to contiguous structures, positioning injury, ureteral stricture, ureteral avulsion, ureteral injury, need for prolonged ureteral stent, inability to perform ureteroscopy, need for an interval procedure, inability to clear stone  burden, stent discomfort/pain, heart attack, stroke, pulmonary embolus and the inherent risks with general anesthesia.  ? ?For shockwave lithotripsy I described the risks which include arrhythmia, kidney contusion, kidney hemorrhage, need for transfusion, pain, inability to adequately break up stone, inability to pass stone fragments, Steinstrasse, infection associated with obstructing stones, need for alternate surgical procedure, need for repeat shockwave lithotripsy, MI, CVA, PE and the inherent risks with anesthesia/conscious sedation.  ? ?For PCNL I described the risks including positioning injury, pneumothorax, hydrothorax, need for chest tube, inability to clear stone burden, renal laceration, arterial venous fistula or malformation, need for embolization of kidney, loss of kidney or renal function, need for repeat procedure, need for prolonged nephrostomy tube, ureteral avulsion, MI, CVA, PE and the inherent risks of general anesthesia.  ? ?- The patient would like to proceed with Left ESWL ?- Urinalysis, Routine w reflex microscopic ? ? ?No follow-ups on file. ? ?Luisa Hart  Myrlene Riera, MD ? ?[facility] [place] ? ? ?

## 2022-03-09 NOTE — H&P (View-Only) (Signed)
? ?03/09/2022 ?9:09 AM  ? ?Kyle Mccarthy ?09-25-1956 ?[id number] ? ?Referring provider: Lianne Moris, PA-C ?[address] ?[place],  [place] [address] ? ?Nephrolithiasis ? ? ?HPI: ?Kyle Mccarthy is a 65yo here for evaluation of nephrolithiasis. Starting 10 days ago he developed left flank pain that was sharp, intermittent severe and nonraditing. He presented to [facility] on 3/17 and was diagnosed with a 37mm right renal calculus on KUB report. He has had 3 stone events in the past and his last stone events was several years ago. He denies gross hematuria and he denies worsening LUTS. He is currently on flomax 0.4mg  daily. UA today is normal. The left flank pain has improved significantly and is a 2/10. No fevers. No other associated symptoms. No exacerbating events. Narcotics alleviate the pain. CT from today shows a right lower pole calculus and a 4-49mm left distal ureteral calculus.  ? ? ?PMH: ?Past Medical History:  ?Diagnosis Date  ? Hypertension   ? ? ?Surgical History: ?Past Surgical History:  ?Procedure Laterality Date  ? EYE SURGERY    ? cateract surgery 20 yrs ago  ? ? ?Home Medications:  ?Allergies as of 03/09/2022   ? ?   Reactions  ? No Known Allergies   ? ?  ? ?  ?Medication List  ?  ? ?  ? Accurate as of March 09, 2022  9:09 AM. If you have any questions, ask your nurse or doctor.  ?  ?  ? ?  ? ?atorvastatin 40 MG tablet ?Commonly known as: LIPITOR ?Take 40 mg by mouth daily. ?  ?diclofenac Sodium 1 % Gel ?Commonly known as: VOLTAREN ?SMARTSIG:1-4 Gram(s) Topical Twice Daily ?  ?donepezil 5 MG tablet ?Commonly known as: ARICEPT ?Take 5 mg by mouth at bedtime. ?  ?lisinopril 40 MG tablet ?Commonly known as: ZESTRIL ?Take 40 mg by mouth daily. ?  ?ondansetron 8 MG tablet ?Commonly known as: ZOFRAN ?Take 8 mg by mouth 3 (three) times daily. ?  ?sertraline 100 MG tablet ?Commonly known as: ZOLOFT ?Take 1 tablet (100 mg total) by mouth daily. ?  ?simvastatin 20 MG tablet ?Commonly known as: ZOCOR ?Take 20 mg by mouth at  bedtime. ?  ?tamsulosin 0.4 MG Caps capsule ?Commonly known as: FLOMAX ?Take 0.4 mg by mouth daily. ?  ?Xarelto 20 MG Tabs tablet ?Generic drug: rivaroxaban ?Take 20 mg by mouth daily. ?  ?zolpidem 10 MG tablet ?Commonly known as: AMBIEN ?Take 1 tablet (10 mg total) by mouth at bedtime as needed for sleep. ?  ? ?  ? ? ?Allergies:  ?Allergies  ?Allergen Reactions  ? No Known Allergies   ? ? ?Family History: ?Family History  ?Problem Relation Age of Onset  ? Alcohol abuse Maternal Uncle   ? Post-traumatic stress disorder Son   ? ? ?Social History:  reports that he has never smoked. He has never used smokeless tobacco. He reports current alcohol use of about 2.0 standard drinks per week. He reports that he does not use drugs. ? ?ROS: ?All other review of systems were reviewed and are negative except what is noted above in HPI ? ?Physical Exam: ?BP (!) 154/73   Pulse (!) 49   Ht 5\' 10"  (1.778 m)   Wt 250 lb (113.4 kg)   BMI 35.87 kg/m?   ?Constitutional:  Alert and oriented, No acute distress. ?HEENT: [place] AT, moist mucus membranes.  Trachea midline, no masses. ?Cardiovascular: No clubbing, cyanosis, or edema. ?Respiratory: Normal respiratory effort, no increased  work of breathing. ?GI: Abdomen is soft, nontender, nondistended, no abdominal masses ?GU: No CVA tenderness.  ?Lymph: No cervical or inguinal lymphadenopathy. ?Skin: No rashes, bruises or suspicious lesions. ?Neurologic: Grossly intact, no focal deficits, moving all 4 extremities. ?Psychiatric: Normal mood and affect. ? ?Laboratory Data: ?Lab Results  ?Component Value Date  ? WBC 4.5 08/22/2019  ? HGB 13.5 08/22/2019  ? HCT 41.4 08/22/2019  ? MCV 97.0 08/22/2019  ? PLT 133 (L) 08/22/2019  ? ? ?Lab Results  ?Component Value Date  ? CREATININE 1.12 08/22/2019  ? ? ?No results found for: PSA ? ?No results found for: TESTOSTERONE ? ?Lab Results  ?Component Value Date  ? HGBA1C 5.3 08/22/2019  ? ? ?Urinalysis ?No results found for: COLORURINE, APPEARANCEUR,  LABSPEC, PHURINE, GLUCOSEU, HGBUR, BILIRUBINUR, KETONESUR, PROTEINUR, UROBILINOGEN, NITRITE, LEUKOCYTESUR ? ?No results found for: LABMICR, WBCUA, RBCUA, LABEPIT, MUCUS, BACTERIA ? ?Pertinent Imaging: ?KUB ?No results found for this or any previous visit. ? ?No results found for this or any previous visit. ? ?No results found for this or any previous visit. ? ?No results found for this or any previous visit. ? ?No results found for this or any previous visit. ? ?No results found for this or any previous visit. ? ?No results found for this or any previous visit. ? ?No results found for this or any previous visit. ? ? ?Assessment & Plan:   ? ?1. Kidney stones ?-We discussed the management of kidney stones. These options include observation, ureteroscopy, shockwave lithotripsy (ESWL) and percutaneous nephrolithotomy (PCNL). We discussed which options are relevant to the patient's stone(s). We discussed the natural history of kidney stones as well as the complications of untreated stones and the impact on quality of life without treatment as well as with each of the above listed treatments. We also discussed the efficacy of each treatment in its ability to clear the stone burden. With any of these management options I discussed the signs and symptoms of infection and the need for emergent treatment should these be experienced. For each option we discussed the ability of each procedure to clear the patient of their stone burden.  ? ?For observation I described the risks which include but are not limited to silent renal damage, life-threatening infection, need for emergent surgery, failure to pass stone and pain.  ? ?For ureteroscopy I described the risks which include bleeding, infection, damage to contiguous structures, positioning injury, ureteral stricture, ureteral avulsion, ureteral injury, need for prolonged ureteral stent, inability to perform ureteroscopy, need for an interval procedure, inability to clear stone  burden, stent discomfort/pain, heart attack, stroke, pulmonary embolus and the inherent risks with general anesthesia.  ? ?For shockwave lithotripsy I described the risks which include arrhythmia, kidney contusion, kidney hemorrhage, need for transfusion, pain, inability to adequately break up stone, inability to pass stone fragments, Steinstrasse, infection associated with obstructing stones, need for alternate surgical procedure, need for repeat shockwave lithotripsy, MI, CVA, PE and the inherent risks with anesthesia/conscious sedation.  ? ?For PCNL I described the risks including positioning injury, pneumothorax, hydrothorax, need for chest tube, inability to clear stone burden, renal laceration, arterial venous fistula or malformation, need for embolization of kidney, loss of kidney or renal function, need for repeat procedure, need for prolonged nephrostomy tube, ureteral avulsion, MI, CVA, PE and the inherent risks of general anesthesia.  ? ?- The patient would like to proceed with Left ESWL ?- Urinalysis, Routine w reflex microscopic ? ? ?No follow-ups on file. ? ?Luisa Hart  Becky Colan, MD ? ?[facility] [place] ? ? ?

## 2022-03-09 NOTE — Progress Notes (Signed)
? ?03/09/2022 ?9:09 AM  ? ?Kyle Mccarthy ?09-25-1956 ?[id number] ? ?Referring provider: Lianne Moris, PA-C ?[address] ?[place],  [place] [address] ? ?Nephrolithiasis ? ? ?HPI: ?[name] is a 65yo here for evaluation of nephrolithiasis. Starting 10 days ago he developed left flank pain that was sharp, intermittent severe and nonraditing. He presented to [facility] on 3/17 and was diagnosed with a 37mm right renal calculus on KUB report. He has had 3 stone events in the past and his last stone events was several years ago. He denies gross hematuria and he denies worsening LUTS. He is currently on flomax 0.4mg  daily. UA today is normal. The left flank pain has improved significantly and is a 2/10. No fevers. No other associated symptoms. No exacerbating events. Narcotics alleviate the pain. CT from today shows a right lower pole calculus and a 4-49mm left distal ureteral calculus.  ? ? ?PMH: ?Past Medical History:  ?Diagnosis Date  ? Hypertension   ? ? ?Surgical History: ?Past Surgical History:  ?Procedure Laterality Date  ? EYE SURGERY    ? cateract surgery 20 yrs ago  ? ? ?Home Medications:  ?Allergies as of 03/09/2022   ? ?   Reactions  ? No Known Allergies   ? ?  ? ?  ?Medication List  ?  ? ?  ? Accurate as of March 09, 2022  9:09 AM. If you have any questions, ask your nurse or doctor.  ?  ?  ? ?  ? ?atorvastatin 40 MG tablet ?Commonly known as: LIPITOR ?Take 40 mg by mouth daily. ?  ?diclofenac Sodium 1 % Gel ?Commonly known as: VOLTAREN ?SMARTSIG:1-4 Gram(s) Topical Twice Daily ?  ?donepezil 5 MG tablet ?Commonly known as: ARICEPT ?Take 5 mg by mouth at bedtime. ?  ?lisinopril 40 MG tablet ?Commonly known as: ZESTRIL ?Take 40 mg by mouth daily. ?  ?ondansetron 8 MG tablet ?Commonly known as: ZOFRAN ?Take 8 mg by mouth 3 (three) times daily. ?  ?sertraline 100 MG tablet ?Commonly known as: ZOLOFT ?Take 1 tablet (100 mg total) by mouth daily. ?  ?simvastatin 20 MG tablet ?Commonly known as: ZOCOR ?Take 20 mg by mouth at  bedtime. ?  ?tamsulosin 0.4 MG Caps capsule ?Commonly known as: FLOMAX ?Take 0.4 mg by mouth daily. ?  ?Xarelto 20 MG Tabs tablet ?Generic drug: rivaroxaban ?Take 20 mg by mouth daily. ?  ?zolpidem 10 MG tablet ?Commonly known as: AMBIEN ?Take 1 tablet (10 mg total) by mouth at bedtime as needed for sleep. ?  ? ?  ? ? ?Allergies:  ?Allergies  ?Allergen Reactions  ? No Known Allergies   ? ? ?Family History: ?Family History  ?Problem Relation Age of Onset  ? Alcohol abuse Maternal Uncle   ? Post-traumatic stress disorder Son   ? ? ?Social History:  reports that he has never smoked. He has never used smokeless tobacco. He reports current alcohol use of about 2.0 standard drinks per week. He reports that he does not use drugs. ? ?ROS: ?All other review of systems were reviewed and are negative except what is noted above in HPI ? ?Physical Exam: ?BP (!) 154/73   Pulse (!) 49   Ht 5\' 10"  (1.778 m)   Wt 250 lb (113.4 kg)   BMI 35.87 kg/m?   ?Constitutional:  Alert and oriented, No acute distress. ?HEENT: [place] AT, moist mucus membranes.  Trachea midline, no masses. ?Cardiovascular: No clubbing, cyanosis, or edema. ?Respiratory: Normal respiratory effort, no increased  work of breathing. ?GI: Abdomen is soft, nontender, nondistended, no abdominal masses ?GU: No CVA tenderness.  ?Lymph: No cervical or inguinal lymphadenopathy. ?Skin: No rashes, bruises or suspicious lesions. ?Neurologic: Grossly intact, no focal deficits, moving all 4 extremities. ?Psychiatric: Normal mood and affect. ? ?Laboratory Data: ?Lab Results  ?Component Value Date  ? WBC 4.5 08/22/2019  ? HGB 13.5 08/22/2019  ? HCT 41.4 08/22/2019  ? MCV 97.0 08/22/2019  ? PLT 133 (L) 08/22/2019  ? ? ?Lab Results  ?Component Value Date  ? CREATININE 1.12 08/22/2019  ? ? ?No results found for: PSA ? ?No results found for: TESTOSTERONE ? ?Lab Results  ?Component Value Date  ? HGBA1C 5.3 08/22/2019  ? ? ?Urinalysis ?No results found for: COLORURINE, APPEARANCEUR,  LABSPEC, PHURINE, GLUCOSEU, HGBUR, BILIRUBINUR, KETONESUR, PROTEINUR, UROBILINOGEN, NITRITE, LEUKOCYTESUR ? ?No results found for: LABMICR, WBCUA, RBCUA, LABEPIT, MUCUS, BACTERIA ? ?Pertinent Imaging: ?KUB ?No results found for this or any previous visit. ? ?No results found for this or any previous visit. ? ?No results found for this or any previous visit. ? ?No results found for this or any previous visit. ? ?No results found for this or any previous visit. ? ?No results found for this or any previous visit. ? ?No results found for this or any previous visit. ? ?No results found for this or any previous visit. ? ? ?Assessment & Plan:   ? ?1. Kidney stones ?-We discussed the management of kidney stones. These options include observation, ureteroscopy, shockwave lithotripsy (ESWL) and percutaneous nephrolithotomy (PCNL). We discussed which options are relevant to the patient's stone(s). We discussed the natural history of kidney stones as well as the complications of untreated stones and the impact on quality of life without treatment as well as with each of the above listed treatments. We also discussed the efficacy of each treatment in its ability to clear the stone burden. With any of these management options I discussed the signs and symptoms of infection and the need for emergent treatment should these be experienced. For each option we discussed the ability of each procedure to clear the patient of their stone burden.  ? ?For observation I described the risks which include but are not limited to silent renal damage, life-threatening infection, need for emergent surgery, failure to pass stone and pain.  ? ?For ureteroscopy I described the risks which include bleeding, infection, damage to contiguous structures, positioning injury, ureteral stricture, ureteral avulsion, ureteral injury, need for prolonged ureteral stent, inability to perform ureteroscopy, need for an interval procedure, inability to clear stone  burden, stent discomfort/pain, heart attack, stroke, pulmonary embolus and the inherent risks with general anesthesia.  ? ?For shockwave lithotripsy I described the risks which include arrhythmia, kidney contusion, kidney hemorrhage, need for transfusion, pain, inability to adequately break up stone, inability to pass stone fragments, Steinstrasse, infection associated with obstructing stones, need for alternate surgical procedure, need for repeat shockwave lithotripsy, MI, CVA, PE and the inherent risks with anesthesia/conscious sedation.  ? ?For PCNL I described the risks including positioning injury, pneumothorax, hydrothorax, need for chest tube, inability to clear stone burden, renal laceration, arterial venous fistula or malformation, need for embolization of kidney, loss of kidney or renal function, need for repeat procedure, need for prolonged nephrostomy tube, ureteral avulsion, MI, CVA, PE and the inherent risks of general anesthesia.  ? ?- The patient would like to proceed with Left ESWL ?- Urinalysis, Routine w reflex microscopic ? ? ?No follow-ups on file. ? ?Luisa Hart  Bethany Hirt, MD ? ?[facility] [place] ? ? ?

## 2022-03-10 ENCOUNTER — Telehealth: Payer: Self-pay

## 2022-03-10 NOTE — Telephone Encounter (Signed)
Patient called to ask about proceeding with litho. KUB sent to provider.  ?

## 2022-03-14 ENCOUNTER — Other Ambulatory Visit: Payer: Self-pay

## 2022-03-14 ENCOUNTER — Encounter (HOSPITAL_COMMUNITY)
Admission: RE | Admit: 2022-03-14 | Discharge: 2022-03-14 | Disposition: A | Discharge status: Home / Self Care | Payer: 59 | Source: Ambulatory Visit | Attending: Urology | Admitting: Urology

## 2022-03-14 ENCOUNTER — Encounter (HOSPITAL_COMMUNITY): Payer: Self-pay

## 2022-03-14 DIAGNOSIS — N200 Calculus of kidney: Secondary | ICD-10-CM

## 2022-03-14 NOTE — Pre-Procedure Instructions (Addendum)
Called for pre-op phone call. Patient states he had no information on his Xarelto and whether to stop it. He states, "the nurse  from the office is supposed to call me back and let me know". I Leticia Penna via interoffice chat to ask her as well. I instructed patient to hold this until her hears from the office. ?

## 2022-03-15 ENCOUNTER — Ambulatory Visit (HOSPITAL_COMMUNITY)
Admission: RE | Admit: 2022-03-15 | Discharge: 2022-03-15 | Disposition: A | Discharge status: Home / Self Care | Payer: 59 | Attending: Urology | Admitting: Urology

## 2022-03-15 ENCOUNTER — Encounter (HOSPITAL_COMMUNITY)
Admission: RE | Disposition: A | Discharge status: Home / Self Care | Payer: Self-pay | Source: Home / Self Care | Attending: Urology

## 2022-03-15 ENCOUNTER — Encounter (HOSPITAL_COMMUNITY): Payer: Self-pay | Admitting: Urology

## 2022-03-15 ENCOUNTER — Encounter: Payer: Self-pay | Admitting: Urology

## 2022-03-15 ENCOUNTER — Ambulatory Visit (HOSPITAL_COMMUNITY): Payer: 59

## 2022-03-15 DIAGNOSIS — Z7901 Long term (current) use of anticoagulants: Secondary | ICD-10-CM | POA: Diagnosis not present

## 2022-03-15 DIAGNOSIS — N202 Calculus of kidney with calculus of ureter: Secondary | ICD-10-CM | POA: Insufficient documentation

## 2022-03-15 DIAGNOSIS — I10 Essential (primary) hypertension: Secondary | ICD-10-CM | POA: Insufficient documentation

## 2022-03-15 DIAGNOSIS — Z79899 Other long term (current) drug therapy: Secondary | ICD-10-CM | POA: Insufficient documentation

## 2022-03-15 HISTORY — PX: EXTRACORPOREAL SHOCK WAVE LITHOTRIPSY: SHX1557

## 2022-03-15 SURGERY — LITHOTRIPSY, ESWL
Anesthesia: LOCAL | Laterality: Left

## 2022-03-15 MED ORDER — SODIUM CHLORIDE 0.9 % IV SOLN: Freq: Once | INTRAVENOUS | Status: AC

## 2022-03-15 MED ORDER — DIPHENHYDRAMINE HCL 25 MG PO CAPS
ORAL_CAPSULE | ORAL | Status: AC
  Filled 2022-03-15: qty 1

## 2022-03-15 MED ORDER — DIAZEPAM 5 MG PO TABS
10.0000 mg | ORAL_TABLET | Freq: Once | ORAL | Status: AC
  Administered 2022-03-15: 10 mg via ORAL

## 2022-03-15 MED ORDER — DIPHENHYDRAMINE HCL 25 MG PO CAPS
25.0000 mg | ORAL_CAPSULE | ORAL | Status: AC
  Administered 2022-03-15: 25 mg via ORAL

## 2022-03-15 MED ORDER — HYDROCODONE-ACETAMINOPHEN 5-325 MG PO TABS: 1.0000 | ORAL_TABLET | ORAL | 0 refills | Status: DC | PRN

## 2022-03-15 MED ORDER — DIAZEPAM 5 MG PO TABS
ORAL_TABLET | ORAL | Status: AC
  Filled 2022-03-15: qty 2

## 2022-03-15 MED ORDER — DIAZEPAM 5 MG PO TABS
ORAL_TABLET | ORAL | Status: AC
  Filled 2022-03-15: qty 1

## 2022-03-15 NOTE — Patient Instructions (Signed)
Lithotripsy ?Lithotripsy is a treatment that can help break up kidney stones that are too large to pass on their own. This is a nonsurgical procedure that crushes a kidney stone with shock waves. These shock waves pass through your body and focus on the kidney stone. They cause the kidney stone to break up into smaller pieces while it is still in the urinary tract. The smaller pieces of stone can pass more easily out of your body in the urine. ?Tell a health care provider about: ?Any allergies you have. ?All medicines you are taking, including vitamins, herbs, eye drops, creams, and over-the-counter medicines. ?Any problems you or family members have had with anesthetic medicines. ?Any blood disorders you have. ?Any surgeries you have had. ?Any medical conditions you have. ?Whether you are pregnant or may be pregnant. ?What are the risks? ?Generally, this is a safe procedure. However, problems may occur, including: ?Infection. ?Bleeding from the kidney. ?Bruising of the kidney or skin. ?Scarring of the kidney, which can lead to: ?Increased blood pressure. ?Poor kidney function. ?Return (recurrence) of kidney stones. ?Damage to other structures or organs, such as the liver, colon, spleen, or pancreas. ?Blockage (obstruction) of the tube that carries urine from the kidney to the bladder (ureter). ?Failure of the kidney stone to break into pieces (fragments). ?What happens before the procedure? ?Staying hydrated ?Follow instructions from your health care provider about hydration, which may include: ?Up to 2 hours before the procedure - you may continue to drink clear liquids, such as water, clear fruit juice, black coffee, and plain tea. ?Eating and drinking restrictions ?Follow instructions from your health care provider about eating and drinking, which may include: ?8 hours before the procedure - stop eating heavy meals or foods, such as meat, fried foods, or fatty foods. ?6 hours before the procedure - stop eating  light meals or foods, such as toast or cereal. ?6 hours before the procedure - stop drinking milk or drinks that contain milk. ?2 hours before the procedure - stop drinking clear liquids. ?Medicines ?Ask your health care provider about: ?Changing or stopping your regular medicines. This is especially important if you are taking diabetes medicines or blood thinners. ?Taking medicines such as aspirin and ibuprofen. These medicines can thin your blood. Do not take these medicines unless your health care provider tells you to take them. ?Taking over-the-counter medicines, vitamins, herbs, and supplements. ?Tests ?You may have tests, such as: ?Blood tests. ?Urine tests. ?Imaging tests, such as a CT scan. ?General instructions ?Plan to have someone take you home from the hospital or clinic. ?If you will be going home right after the procedure, plan to have someone with you for 24 hours. ?Ask your health care provider what steps will be taken to help prevent infection. These may include washing skin with a germ-killing soap. ?What happens during the procedure? ? ?An IV will be inserted into one of your veins. ?You will be given one or more of the following: ?A medicine to help you relax (sedative). ?A medicine to make you fall asleep (general anesthetic). ?A water-filled cushion may be placed behind your kidney or on your abdomen. In some cases, you may be placed in a tub of lukewarm water. ?Your body will be positioned in a way that makes it easy to target the kidney stone. ?An X-ray or ultrasound exam will be done to locate your stone. ?Shock waves will be aimed at the stone. If you are awake, you may feel a tapping sensation as   the shock waves pass through your body. ?A flexible tube with holes in it (stent) may be placed in the ureter. This will help keep urine flowing from the kidney if the fragments of the stone have been blocking the ureter. ?The procedure may vary among health care providers and hospitals. ?What  happens after the procedure? ?You may have an X-ray to see whether the procedure was able to break up the kidney stone and how much of the stone has passed. If large stone fragments remain after treatment, you may need to have a second procedure at a later time. ?Your blood pressure, heart rate, breathing rate, and blood oxygen level will be monitored until you leave the hospital or clinic. ?You may be given antibiotics or pain medicine as needed. ?If a stent was placed in your ureter during surgery, it may stay in place for a few weeks. ?You may need to strain your urine to collect pieces of the kidney stone for testing. ?You will need to drink plenty of water. ?If you were given a sedative during the procedure, it can affect you for several hours. Do not drive or operate machinery until your health care provider says that it is safe. ?Summary ?Lithotripsy is a treatment that can help break up kidney stones that are too large to pass on their own. ?Lithotripsy is a nonsurgical procedure that crushes a kidney stone with shock waves. ?Generally, this is a safe procedure. However, problems may occur, including damage to the kidney or other organs, infection, or obstruction of the tube that carries urine from the kidney to the bladder (ureter). ?You may have a stent placed in your ureter to help drain your urine. This stent may stay in place for a few weeks. ?After the procedure, you will need to drink plenty of water. You may be asked to strain your urine to collect pieces of the kidney stone for testing. ?This information is not intended to replace advice given to you by your health care provider. Make sure you discuss any questions you have with your health care provider. ?Document Revised: 09/17/2019 Document Reviewed: 09/18/2019 ?Elsevier Patient Education ? 2022 [employer] ? ?

## 2022-03-15 NOTE — Interval H&P Note (Signed)
History and Physical Interval Note: ? ?03/15/2022 ?10:10 AM ? ?Darroll Bredeson  has presented today for surgery, with the diagnosis of left ureteral calculus.  The various methods of treatment have been discussed with the patient and family. After consideration of risks, benefits and other options for treatment, the patient has consented to  Procedure(s): ?EXTRACORPOREAL SHOCK WAVE LITHOTRIPSY (ESWL) (Left) as a surgical intervention.  The patient's history has been reviewed, patient examined, no change in status, stable for surgery.  I have reviewed the patient's chart and labs.  Questions were answered to the patient's satisfaction.   ? ? ?Elige Radon Citlally Captain ? ? ?

## 2022-03-16 ENCOUNTER — Telehealth: Payer: Self-pay

## 2022-03-16 NOTE — Telephone Encounter (Signed)
Left a voicemail 03-16-2022. ? ?Patient needing a call back regarding recent Lithotripsy. ? ?Please advise. ? ?Call back:  [phone] ? ?Thanks, ?Rosey Bath ?

## 2022-03-17 ENCOUNTER — Encounter (HOSPITAL_COMMUNITY): Payer: Self-pay | Admitting: Urology

## 2022-03-17 NOTE — Telephone Encounter (Signed)
Patient wants to proceed with 2nd litho treatment for other side. ? ?Patient informed he could discuss this at his next post op visit. Patient voiced understanding.,  ?

## 2022-03-21 ENCOUNTER — Other Ambulatory Visit: Payer: Self-pay

## 2022-03-21 DIAGNOSIS — N200 Calculus of kidney: Secondary | ICD-10-CM

## 2022-03-21 NOTE — Telephone Encounter (Signed)
Posting sheet received from MD. ? ?I spoke with Mr. Yett. We have discussed possible surgery dates and 04/05/2022 was agreed upon by all parties. Patient given information about surgery date, what to expect pre-operatively and post operatively.  ?  ?We discussed that a pre-op nurse will be calling to set up the pre-op visit that will take place prior to surgery. Informed patient that our office will communicate any additional care to be provided after surgery.  ?  ?Patients questions or concerns were discussed during our call. Advised to call our office should there be any additional information, questions or concerns that arise. Patient verbalized understanding.   ?

## 2022-03-22 ENCOUNTER — Encounter (HOSPITAL_COMMUNITY): Payer: Self-pay | Admitting: Urology

## 2022-03-29 ENCOUNTER — Ambulatory Visit (INDEPENDENT_AMBULATORY_CARE_PROVIDER_SITE_OTHER): Payer: 59 | Admitting: Physician Assistant

## 2022-03-29 ENCOUNTER — Ambulatory Visit (HOSPITAL_COMMUNITY)
Admission: RE | Admit: 2022-03-29 | Discharge: 2022-03-29 | Disposition: A | Discharge status: Home / Self Care | Payer: 59 | Source: Ambulatory Visit | Attending: Urology | Admitting: Urology

## 2022-03-29 VITALS — BP 137/68 | HR 67 | Ht 69.0 in | Wt 255.0 lb

## 2022-03-29 DIAGNOSIS — N200 Calculus of kidney: Secondary | ICD-10-CM | POA: Insufficient documentation

## 2022-03-29 DIAGNOSIS — R109 Unspecified abdominal pain: Secondary | ICD-10-CM

## 2022-03-29 LAB — URINALYSIS, ROUTINE W REFLEX MICROSCOPIC
Bilirubin, UA: NEGATIVE
Glucose, UA: NEGATIVE
Ketones, UA: NEGATIVE
Leukocytes,UA: NEGATIVE
Nitrite, UA: NEGATIVE
Protein,UA: NEGATIVE
RBC, UA: NEGATIVE
Specific Gravity, UA: 1.025 (ref 1.005–1.030)
Urobilinogen, Ur: 0.2 mg/dL (ref 0.2–1.0)
pH, UA: 5.5 (ref 5.0–7.5)

## 2022-03-29 MED ORDER — HYDROCODONE-ACETAMINOPHEN 5-325 MG PO TABS: 1.0000 | ORAL_TABLET | ORAL | 0 refills | Status: DC | PRN

## 2022-03-29 NOTE — Progress Notes (Signed)
? ?Assessment: ?1. Kidney stones   ?2. Right flank pain   ? ? ?Plan: ?He will continue Flomax as discussed.  Documentation completed for upcoming ESWL on the right kidney.  Norco refilled and he is encouraged to continue increasing fluids to maintain good hydration and follow stone prevention diet.  Follow-up after procedure for KUB and office visit. ? ?Chief Complaint: ?No chief complaint on file. ? ? ?HPI: ?Kyle Mccarthy is a 66 y.o. male who presents for continued evaluation of nephrolithiasis.  Patient is status post ESL W on the left performed on 03/15/2022.  He no longer has pain on the left and thinks he may have passed a few stone fragments but not a lot.  No fever, chills, nausea or vomiting.  No LUTS, no gross hematuria.  He continues to complain of right flank pain of approximately 7/10.  He requests refill of pain medication for upcoming lithotripsy of the right. ? ?UA = clear ?KUB= previous right renal calculus noted.  Opacification of the area of the left distal ureter previous previously noted is no longer present ? ? ?Portions of the above documentation were copied from a prior visit for review purposes only. ? ?Allergies: ?Allergies  ?Allergen Reactions  ? No Known Allergies   ? ? ?PMH: ?Past Medical History:  ?Diagnosis Date  ? Hypertension   ? ? ?PSH: ?Past Surgical History:  ?Procedure Laterality Date  ? EXTRACORPOREAL SHOCK WAVE LITHOTRIPSY Left 03/15/2022  ? Procedure: EXTRACORPOREAL SHOCK WAVE LITHOTRIPSY (ESWL);  Surgeon: Milderd Meager., MD;  Location: [facility];  Service: Urology;  Laterality: Left;  ? EYE SURGERY    ? cateract surgery 20 yrs ago  ? SKIN CANCER EXCISION    ? ? ?SH: ?Social History  ? ?Tobacco Use  ? Smoking status: Never  ? Smokeless tobacco: Never  ?Vaping Use  ? Vaping Use: Never used  ?Substance Use Topics  ? Alcohol use: Yes  ?  Alcohol/week: 2.0 standard drinks  ?  Types: 2 Cans of beer per week  ?  Comment: occassional  ? Drug use: Never   ? ? ?ROS: ?Constitutional:  Negative for fever, chills, weight loss ?CV: Negative for chest pain ?Respiratory:  Negative for shortness of breath, wheezing, sleep apnea, frequent cough ?GI:  Negative for nausea, vomiting, bloody stool, GERD ? ?PE: ?BP 137/68   Pulse 67   Ht 5\' 9"  (1.753 m)   Wt 255 lb (115.7 kg)   BMI 37.66 kg/m?  ?GENERAL APPEARANCE:  Well appearing, well developed, well nourished, NAD ?HEENT:  Atraumatic, normocephalic ?NECK:  Supple. Trachea midline ?ABDOMEN:  Soft, non-tender, no masses ?EXTREMITIES:  Moves all extremities well, without clubbing, cyanosis, or edema ?NEUROLOGIC:  Alert and oriented x 3, normal gait, CN II-XII grossly intact ?MENTAL STATUS:  appropriate ?BACK:  Non-tender to palpation, No CVAT ?SKIN:  Warm, dry, and intact ? ? ?Results: ?Laboratory Data: ?Lab Results  ?Component Value Date  ? WBC 4.5 08/22/2019  ? HGB 13.5 08/22/2019  ? HCT 41.4 08/22/2019  ? MCV 97.0 08/22/2019  ? PLT 133 (L) 08/22/2019  ? ? ?Lab Results  ?Component Value Date  ? CREATININE 1.12 08/22/2019  ? ?Lab Results  ?Component Value Date  ? HGBA1C 5.3 08/22/2019  ? ? ?Urinalysis ?   ?Component Value Date/Time  ? APPEARANCEUR Negative 03/09/2022 1431  ? GLUCOSEU Negative 03/09/2022 1431  ? BILIRUBINUR Negative 03/09/2022 1431  ? PROTEINUR Negative 03/09/2022 1431  ? NITRITE Negative 03/09/2022 1431  ? LEUKOCYTESUR Negative  03/09/2022 1431  ? ? ?Lab Results  ?Component Value Date  ? LABMICR Comment 03/09/2022  ? ? ?Pertinent Imaging: ?Results for orders placed during the hospital encounter of 03/15/22 ? ?DG Abd 1 View ? ?Narrative ?CLINICAL DATA:  Bilateral kidney stones ? ?EXAM: ?ABDOMEN - 1 VIEW ? ?COMPARISON:  03/09/2022 ? ?FINDINGS: ?No bowel dilatation to suggest obstruction. No evidence of ?pneumoperitoneum, portal venous gas or pneumatosis. ? ?5 mm calcification projecting over the left side of the sacrum ?likely reflecting a distal ureteral calculus. Stable 5 mm right ?renal calculus. ? ?No acute  osseous abnormality. ? ?IMPRESSION: ?1. 5 mm calcification projecting over the left side of the sacrum ?likely reflecting a distal ureteral calculus. ?2. Stable 5 mm right renal calculus. ? ? ?Electronically Signed ?By: Elige Ko M.D. ?On: 03/15/2022 09:29 ? ?No results found for this or any previous visit. ? ?No results found for this or any previous visit. ? ?No results found for this or any previous visit. ? ?No results found for this or any previous visit. ? ?No results found for this or any previous visit. ? ?No results found for this or any previous visit. ? ?Results for orders placed in visit on 03/09/22 ? ?CT RENAL STONE STUDY ? ?Narrative ?CLINICAL DATA:  Flank pain, history of nephrolithiasis, left lower ?quadrant pain for 1 week ? ?EXAM: ?CT ABDOMEN AND PELVIS WITHOUT CONTRAST ? ?TECHNIQUE: ?Multidetector CT imaging of the abdomen and pelvis was performed ?following the standard protocol without IV contrast. ? ?RADIATION DOSE REDUCTION: This exam was performed according to the ?departmental dose-optimization program which includes automated ?exposure control, adjustment of the mA and/or kV according to ?patient size and/or use of iterative reconstruction technique. ? ?COMPARISON:  None available ? ?FINDINGS: ?Lower chest: Scattered areas of bibasilar atelectasis. Subcentimeter ?calcified granuloma in the left lower lobe. Mild cardiac ?enlargement. No pericardial or pleural effusion. ? ?Hepatobiliary: Limited without IV contrast. Round 4.4 cm hypodense ?lesion in the right liver posteriorly has relatively low Hounsfield ?measurements, image 17/2, but remains indeterminate by noncontrast ?imaging. This could be further evaluated with abdominal ultrasound ?to determine if the lesion is solid or cystic. There is an ?additional ill-defined subcapsular hypodense lesion more inferior in ?the right liver measuring 2.1 cm, image 27/2. ? ?No biliary dilatation or obstruction pattern. ? ?Cholelithiasis noted.  Gallbladder nondistended. Common bile duct ?nondilated. ? ?Pancreas: Unremarkable. No pancreatic ductal dilatation or ?surrounding inflammatory changes. ? ?Spleen: Normal in size without focal abnormality. ? ?Adrenals/Urinary Tract: Normal adrenal glands. ? ?Kidneys demonstrate tiny punctate nonobstructing intrarenal calculi ?bilaterally. No acute hydronephrosis. Ureters are symmetric and ?decompressed. ? ?However, the left distal ureter does demonstrate a nonobstructing 5 ?mm calculus, image 73/2 in the pelvis very close to the left UVJ. ? ?Bladder unremarkable. ? ?Stomach/Bowel: Negative for bowel obstruction, significant ?dilatation, ileus, or free air. Normal appendix demonstrated. ?Scattered left colon and sigmoid diverticulosis without acute ?inflammatory process. ? ?No free fluid, fluid collection, hemorrhage, hematoma, abscess or ?ascites. ? ?Vascular/Lymphatic: Limited without IV contrast. Aorta ?atherosclerotic. Negative for aneurysm. No retroperitoneal ?hemorrhage or hematoma. ? ?No bulky adenopathy. ? ?Nonspecific small nonenlarged mesenteric root lymph nodes with ?slight mesenteric haziness, image 34/2. This can be seen with ?mesenteric adenitis/panniculitis. ? ?Reproductive: Prostate gland is mildly enlarged. Seminal vesicles ?are symmetric. Prostate calcifications noted. ? ?Other: No abdominal wall hernia or abnormality. No abdominopelvic ?ascites. ? ?Musculoskeletal: Degenerative changes throughout the spine. Lower ?lumbar facet arthropathy. L5-S1 advanced degenerative disc disease ?noted. No acute osseous finding. ? ?IMPRESSION: ?5  mm nonobstructing left distal ureteral calculus close to the left ?UVJ. ? ?Additional nonobstructing punctate intrarenal calculi bilaterally. ? ?Right liver indeterminate hypodense lesions measuring up to 4.4 cm. ?Consider initial evaluation with nonemergent follow-up abdominal ?ultrasound to determine if solid or cystic. ? ?Cholelithiasis ? ?Aortic Atherosclerosis  (ICD10-I70.0). ? ? ?Electronically Signed ?By: Judie PetitM.  Shick M.D. ?On: 03/09/2022 10:37 ? ?No results found for this or any previous visit (from the past 24 hour(s)).  ?

## 2022-04-04 ENCOUNTER — Encounter (HOSPITAL_COMMUNITY): Payer: Self-pay

## 2022-04-04 ENCOUNTER — Encounter (HOSPITAL_COMMUNITY)
Admission: RE | Admit: 2022-04-04 | Discharge: 2022-04-04 | Disposition: A | Discharge status: Home / Self Care | Payer: 59 | Source: Ambulatory Visit | Attending: Urology | Admitting: Urology

## 2022-04-04 ENCOUNTER — Other Ambulatory Visit: Payer: Self-pay

## 2022-04-04 HISTORY — DX: Depression, unspecified: F32.A

## 2022-04-04 HISTORY — DX: Anxiety disorder, unspecified: F41.9

## 2022-04-05 ENCOUNTER — Ambulatory Visit (HOSPITAL_COMMUNITY)
Admission: RE | Admit: 2022-04-05 | Discharge: 2022-04-05 | Disposition: A | Discharge status: Home / Self Care | Payer: 59 | Attending: Urology | Admitting: Urology

## 2022-04-05 ENCOUNTER — Encounter (HOSPITAL_COMMUNITY)
Admission: RE | Disposition: A | Discharge status: Home / Self Care | Payer: Self-pay | Source: Home / Self Care | Attending: Urology

## 2022-04-05 ENCOUNTER — Ambulatory Visit (HOSPITAL_COMMUNITY): Payer: 59

## 2022-04-05 ENCOUNTER — Encounter (HOSPITAL_COMMUNITY): Payer: Self-pay | Admitting: Urology

## 2022-04-05 DIAGNOSIS — N200 Calculus of kidney: Secondary | ICD-10-CM | POA: Insufficient documentation

## 2022-04-05 DIAGNOSIS — I10 Essential (primary) hypertension: Secondary | ICD-10-CM | POA: Insufficient documentation

## 2022-04-05 HISTORY — DX: Personal history of other venous thrombosis and embolism: Z86.718

## 2022-04-05 HISTORY — DX: Sleep apnea, unspecified: G47.30

## 2022-04-05 HISTORY — PX: EXTRACORPOREAL SHOCK WAVE LITHOTRIPSY: SHX1557

## 2022-04-05 SURGERY — LITHOTRIPSY, ESWL
Anesthesia: LOCAL | Laterality: Right

## 2022-04-05 MED ORDER — ONDANSETRON HCL 8 MG PO TABS: 8.0000 mg | ORAL_TABLET | Freq: Three times a day (TID) | ORAL | 0 refills | Status: DC

## 2022-04-05 MED ORDER — HYDROCODONE-ACETAMINOPHEN 5-325 MG PO TABS: 1.0000 | ORAL_TABLET | ORAL | 0 refills | Status: AC | PRN

## 2022-04-05 MED ORDER — DIAZEPAM 5 MG PO TABS
10.0000 mg | ORAL_TABLET | Freq: Once | ORAL | Status: AC
  Administered 2022-04-05: 10 mg via ORAL
  Filled 2022-04-05: qty 2

## 2022-04-05 MED ORDER — DIPHENHYDRAMINE HCL 25 MG PO CAPS
25.0000 mg | ORAL_CAPSULE | ORAL | Status: AC
  Administered 2022-04-05: 25 mg via ORAL
  Filled 2022-04-05: qty 1

## 2022-04-05 MED ORDER — TAMSULOSIN HCL 0.4 MG PO CAPS: 0.4000 mg | ORAL_CAPSULE | Freq: Every day | ORAL | 0 refills | Status: DC

## 2022-04-05 MED ORDER — SODIUM CHLORIDE 0.9 % IV SOLN: Freq: Once | INTRAVENOUS | Status: AC

## 2022-04-05 NOTE — Interval H&P Note (Signed)
History and Physical Interval Note: ? ?04/05/2022 ?7:59 AM ? ?Kyle Mccarthy  has presented today for surgery, with the diagnosis of right renal calculus.  The various methods of treatment have been discussed with the patient and family. After consideration of risks, benefits and other options for treatment, the patient has consented to  Procedure(s): ?EXTRACORPOREAL SHOCK WAVE LITHOTRIPSY (ESWL) (Right) as a surgical intervention.  The patient's history has been reviewed, patient examined, no change in status, stable for surgery.  I have reviewed the patient's chart and labs.  Questions were answered to the patient's satisfaction.   ? ? ?Nicolette Bang ? ? ?

## 2022-04-06 ENCOUNTER — Encounter (HOSPITAL_COMMUNITY): Payer: Self-pay | Admitting: Urology

## 2022-04-07 ENCOUNTER — Ambulatory Visit: Payer: 59 | Admitting: Neurology

## 2022-04-20 ENCOUNTER — Ambulatory Visit (INDEPENDENT_AMBULATORY_CARE_PROVIDER_SITE_OTHER): Payer: 59 | Admitting: Physician Assistant

## 2022-04-20 ENCOUNTER — Ambulatory Visit (HOSPITAL_COMMUNITY)
Admission: RE | Admit: 2022-04-20 | Discharge: 2022-04-20 | Disposition: A | Discharge status: Home / Self Care | Payer: 59 | Source: Ambulatory Visit | Attending: Physician Assistant | Admitting: Physician Assistant

## 2022-04-20 VITALS — BP 158/79 | HR 76 | Ht 70.0 in | Wt 250.0 lb

## 2022-04-20 DIAGNOSIS — R109 Unspecified abdominal pain: Secondary | ICD-10-CM

## 2022-04-20 DIAGNOSIS — N200 Calculus of kidney: Secondary | ICD-10-CM

## 2022-04-20 DIAGNOSIS — N12 Tubulo-interstitial nephritis, not specified as acute or chronic: Secondary | ICD-10-CM

## 2022-04-20 LAB — URINALYSIS, ROUTINE W REFLEX MICROSCOPIC
Bilirubin, UA: NEGATIVE
Glucose, UA: NEGATIVE
Leukocytes,UA: NEGATIVE
Nitrite, UA: NEGATIVE
Protein,UA: NEGATIVE
Specific Gravity, UA: >1.03 — ABNORMAL HIGH (ref 1.005–1.030)
Urobilinogen, Ur: 0.2 mg/dL (ref 0.2–1.0)
pH, UA: 5.5 (ref 5.0–7.5)

## 2022-04-20 LAB — MICROSCOPIC EXAMINATION
Renal Epithel, UA: NONE SEEN /hpf
WBC, UA: NONE SEEN /hpf (ref 0–5)

## 2022-04-20 MED ORDER — SULFAMETHOXAZOLE-TRIMETHOPRIM 800-160 MG PO TABS: 1.0000 | ORAL_TABLET | Freq: Two times a day (BID) | ORAL | 0 refills | Status: DC

## 2022-04-20 NOTE — Progress Notes (Signed)
? ?Assessment: ?1. Kidney stones ?- Urinalysis, Routine w reflex microscopic ?- Ultrasound renal complete; Future ? ?2. Flank pain ? ?3. Pyelonephritis ?  ? ?Plan: ?FU in 6 weeks after renal US.  Continue to strain urine and remain on Flomax until follow-up in 6 weeks for renal ultrasound.  We will treat for potential pyelonephritis based on history and physical exam with Bactrim DS x 2 weeks.  Follow-up if symptoms not improving and advised to go to the emergency department if symptoms worsen or if he develops increase in temp, pain, nausea or vomiting. ? ?Chief Complaint: ?No chief complaint on file. ? ? ?HPI: ?Kyle Mccarthy is a 66 y.o. male who presents for continued evaluation of nephrolithiasis right kidney.  Patient is status post EWS L performed on 04/05/2022.Marland Kitchen He continues to have significant R sided flank pain and reports temps up to 100.1 since the EWSL.  No gross hematuria and no passage of fragments thus far.  He denies chills, nausea vomiting.  KUB performed today reveals a 5 mm calcification in the right lower renal pole. ? ? ?Portions of the above documentation were copied from a prior visit for review purposes only. ? ?Allergies: ?Allergies  ?Allergen Reactions  ? No Known Allergies   ? ? ?PMH: ?Past Medical History:  ?Diagnosis Date  ? Anxiety   ? Depression   ? Hx of blood clots   ? left leg years ago  ? Hypertension   ? Sleep apnea   ? ? ?PSH: ?Past Surgical History:  ?Procedure Laterality Date  ? EXTRACORPOREAL SHOCK WAVE LITHOTRIPSY Left 03/15/2022  ? Procedure: EXTRACORPOREAL SHOCK WAVE LITHOTRIPSY (ESWL);  Surgeon: Primus Bravo., MD;  Location: [facility];  Service: Urology;  Laterality: Left;  ? EXTRACORPOREAL SHOCK WAVE LITHOTRIPSY Right 04/05/2022  ? Procedure: EXTRACORPOREAL SHOCK WAVE LITHOTRIPSY (ESWL);  Surgeon: Cleon Gustin, MD;  Location: [facility];  Service: Urology;  Laterality: Right;  ? EYE SURGERY    ? cateract surgery 20 yrs ago  ? SKIN CANCER EXCISION     ? ? ?SH: ?Social History  ? ?Tobacco Use  ? Smoking status: Never  ? Smokeless tobacco: Never  ?Vaping Use  ? Vaping Use: Never used  ?Substance Use Topics  ? Alcohol use: Yes  ?  Alcohol/week: 2.0 standard drinks  ?  Types: 2 Cans of beer per week  ?  Comment: occassional  ? Drug use: Never  ? ? ?ROS: ?Constitutional:  Negative for fever, chills, weight loss ?CV: Negative for chest pain ?Respiratory:  Negative for shortness of breath, wheezing, sleep apnea, frequent cough ?GI:  Negative for nausea, vomiting, bloody stool, GERD ? ?PE: ?BP (!) 158/79   Pulse 76   Ht 5\' 10"  (1.778 m)   Wt 250 lb (113.4 kg)   BMI 35.87 kg/m?  ?GENERAL APPEARANCE:  Well appearing, well developed, well nourished, NAD ?HEENT:  Atraumatic, normocephalic ?NECK:  Supple. Trachea midline ?ABDOMEN:  Soft, non-tender, no masses ?EXTREMITIES:  Moves all extremities well, without clubbing, cyanosis, or edema ?NEUROLOGIC:  Alert and oriented x 3, normal gait, CN II-XII grossly intact ?MENTAL STATUS:  appropriate ?BACK:  Non-tender to palpation, No CVAT ?SKIN:  Warm, dry, and intact ? ? ?Results: ?Laboratory Data: ?Lab Results  ?Component Value Date  ? WBC 4.5 08/22/2019  ? HGB 13.5 08/22/2019  ? HCT 41.4 08/22/2019  ? MCV 97.0 08/22/2019  ? PLT 133 (L) 08/22/2019  ? ? ?Lab Results  ?Component Value Date  ? CREATININE 1.12 08/22/2019  ? ? ?  No results found for: PSA ? ?No results found for: TESTOSTERONE ? ?Lab Results  ?Component Value Date  ? HGBA1C 5.3 08/22/2019  ? ? ?Urinalysis ?   ?Component Value Date/Time  ? APPEARANCEUR Clear 03/29/2022 1429  ? GLUCOSEU Negative 03/29/2022 1429  ? BILIRUBINUR Negative 03/29/2022 1429  ? PROTEINUR Negative 03/29/2022 1429  ? NITRITE Negative 03/29/2022 1429  ? LEUKOCYTESUR Negative 03/29/2022 1429  ? ? ?Lab Results  ?Component Value Date  ? LABMICR Comment 03/29/2022  ? ? ?Pertinent Imaging: ?Results for orders placed during the hospital encounter of 04/05/22 ? ?DG Abd 1 View ? ?Narrative ?CLINICAL DATA:   Right renal stone ? ?EXAM: ?ABDOMEN - 1 VIEW ? ?COMPARISON:  Previous studies including the examination done on ?03/29/2022 ? ?FINDINGS: ?Bowel gas pattern is nonspecific. There is 5 mm calcific density ?overlying the lower pole of right kidney. Kidneys are partly ?obscured by bowel contents. No abnormal masses are seen. There is ?mild dextroscoliosis in the lumbar spine. Last lumbar vertebra is ?transitional. Degenerative changes are noted in the lumbar spine. ? ?IMPRESSION: ?5 mm right renal calculus. ? ? ?Electronically Signed ?By: Elmer Picker M.D. ?On: 04/05/2022 08:12 ? ?No results found for this or any previous visit. ? ?No results found for this or any previous visit. ? ?No results found for this or any previous visit. ? ?No results found for this or any previous visit. ? ?No results found for this or any previous visit. ? ?No results found for this or any previous visit. ? ?Results for orders placed in visit on 03/09/22 ? ?CT RENAL STONE STUDY ? ?Narrative ?CLINICAL DATA:  Flank pain, history of nephrolithiasis, left lower ?quadrant pain for 1 week ? ?EXAM: ?CT ABDOMEN AND PELVIS WITHOUT CONTRAST ? ?TECHNIQUE: ?Multidetector CT imaging of the abdomen and pelvis was performed ?following the standard protocol without IV contrast. ? ?RADIATION DOSE REDUCTION: This exam was performed according to the ?departmental dose-optimization program which includes automated ?exposure control, adjustment of the mA and/or kV according to ?patient size and/or use of iterative reconstruction technique. ? ?COMPARISON:  None available ? ?FINDINGS: ?Lower chest: Scattered areas of bibasilar atelectasis. Subcentimeter ?calcified granuloma in the left lower lobe. Mild cardiac ?enlargement. No pericardial or pleural effusion. ? ?Hepatobiliary: Limited without IV contrast. Round 4.4 cm hypodense ?lesion in the right liver posteriorly has relatively low Hounsfield ?measurements, image 17/2, but remains indeterminate by  noncontrast ?imaging. This could be further evaluated with abdominal ultrasound ?to determine if the lesion is solid or cystic. There is an ?additional ill-defined subcapsular hypodense lesion more inferior in ?the right liver measuring 2.1 cm, image 27/2. ? ?No biliary dilatation or obstruction pattern. ? ?Cholelithiasis noted. Gallbladder nondistended. Common bile duct ?nondilated. ? ?Pancreas: Unremarkable. No pancreatic ductal dilatation or ?surrounding inflammatory changes. ? ?Spleen: Normal in size without focal abnormality. ? ?Adrenals/Urinary Tract: Normal adrenal glands. ? ?Kidneys demonstrate tiny punctate nonobstructing intrarenal calculi ?bilaterally. No acute hydronephrosis. Ureters are symmetric and ?decompressed. ? ?However, the left distal ureter does demonstrate a nonobstructing 5 ?mm calculus, image 73/2 in the pelvis very close to the left UVJ. ? ?Bladder unremarkable. ? ?Stomach/Bowel: Negative for bowel obstruction, significant ?dilatation, ileus, or free air. Normal appendix demonstrated. ?Scattered left colon and sigmoid diverticulosis without acute ?inflammatory process. ? ?No free fluid, fluid collection, hemorrhage, hematoma, abscess or ?ascites. ? ?Vascular/Lymphatic: Limited without IV contrast. Aorta ?atherosclerotic. Negative for aneurysm. No retroperitoneal ?hemorrhage or hematoma. ? ?No bulky adenopathy. ? ?Nonspecific small nonenlarged mesenteric root lymph nodes with ?  slight mesenteric haziness, image 34/2. This can be seen with ?mesenteric adenitis/panniculitis. ? ?Reproductive: Prostate gland is mildly enlarged. Seminal vesicles ?are symmetric. Prostate calcifications noted. ? ?Other: No abdominal wall hernia or abnormality. No abdominopelvic ?ascites. ? ?Musculoskeletal: Degenerative changes throughout the spine. Lower ?lumbar facet arthropathy. L5-S1 advanced degenerative disc disease ?noted. No acute osseous finding. ? ?IMPRESSION: ?5 mm nonobstructing left distal ureteral  calculus close to the left ?UVJ. ? ?Additional nonobstructing punctate intrarenal calculi bilaterally. ? ?Right liver indeterminate hypodense lesions measuring up to 4.4 cm. ?Consider initial evaluation with nonemergent fol

## 2022-04-29 ENCOUNTER — Telehealth: Payer: Self-pay

## 2022-04-29 NOTE — Telephone Encounter (Signed)
Patient called advising that he is still having bladder spasms and wanted to know since he is going back to work Monday and he drives a tractor trailer if he could have pain meds sent in. ? ? ? ?Pharmacy: ?[id number] [place], [place] - [address] ?

## 2022-05-02 ENCOUNTER — Other Ambulatory Visit: Payer: Self-pay | Admitting: Urology

## 2022-05-02 MED ORDER — OXYBUTYNIN CHLORIDE 5 MG PO TABS: 5.0000 mg | ORAL_TABLET | Freq: Three times a day (TID) | ORAL | 0 refills | Status: DC | PRN

## 2022-05-02 NOTE — Telephone Encounter (Signed)
Patient notified and is [facility] [place] ?

## 2022-05-12 ENCOUNTER — Ambulatory Visit (HOSPITAL_COMMUNITY)
Admission: RE | Admit: 2022-05-12 | Discharge: 2022-05-12 | Disposition: A | Discharge status: Home / Self Care | Payer: 59 | Source: Ambulatory Visit | Attending: Physician Assistant | Admitting: Physician Assistant

## 2022-05-12 ENCOUNTER — Encounter: Payer: Self-pay | Admitting: Neurology

## 2022-05-12 ENCOUNTER — Other Ambulatory Visit: Payer: Self-pay

## 2022-05-12 ENCOUNTER — Ambulatory Visit: Payer: 59 | Admitting: Neurology

## 2022-05-12 VITALS — BP 143/81 | HR 50 | Ht 70.0 in | Wt 248.0 lb

## 2022-05-12 DIAGNOSIS — N200 Calculus of kidney: Secondary | ICD-10-CM | POA: Insufficient documentation

## 2022-05-12 DIAGNOSIS — R4189 Other symptoms and signs involving cognitive functions and awareness: Secondary | ICD-10-CM | POA: Insufficient documentation
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
## 2022-05-13 LAB — COMPREHENSIVE METABOLIC PANEL
ALT: 14 IU/L (ref 0–44)
AST: 19 IU/L (ref 0–40)
Albumin/Globulin Ratio: 1.8 (ref 1.2–2.2)
Albumin: 4.2 g/dL (ref 3.8–4.8)
Alkaline Phosphatase: 85 IU/L (ref 44–121)
BUN/Creatinine Ratio: 12 (ref 10–24)
BUN: 14 mg/dL (ref 8–27)
Bilirubin Total: 0.4 mg/dL (ref 0.0–1.2)
CO2: 24 mmol/L (ref 20–29)
Calcium: 9.6 mg/dL (ref 8.6–10.2)
Chloride: 103 mmol/L (ref 96–106)
Creatinine, Ser: 1.18 mg/dL (ref 0.76–1.27)
Globulin, Total: 2.4 g/dL (ref 1.5–4.5)
Glucose: 88 mg/dL (ref 70–99)
Potassium: 4.7 mmol/L (ref 3.5–5.2)
Sodium: 139 mmol/L (ref 134–144)
Total Protein: 6.6 g/dL (ref 6.0–8.5)
eGFR: 67 mL/min/{1.73_m2} (ref >59)

## 2022-05-13 LAB — VITAMIN B12: Vitamin B-12: 219 pg/mL — ABNORMAL LOW (ref 232–1245)

## 2022-05-13 LAB — RPR: RPR Ser Ql: NONREACTIVE

## 2022-05-13 LAB — TSH: TSH: 1.99 u[IU]/mL (ref 0.450–4.500)

## 2022-05-18 ENCOUNTER — Telehealth: Payer: Self-pay | Admitting: Neurology

## 2022-05-18 NOTE — Telephone Encounter (Signed)
[employer] Auth: NPR case [id number] faxed to [facility]

## 2022-05-19 ENCOUNTER — Ambulatory Visit: Payer: 59 | Admitting: Neurology

## 2022-05-19 DIAGNOSIS — R4189 Other symptoms and signs involving cognitive functions and awareness: Secondary | ICD-10-CM

## 2022-05-19 LAB — CALCULI, WITH PHOTOGRAPH (CLINICAL LAB)
Calcium Oxalate Dihydrate: 30 %
Calcium Oxalate Monohydrate: 70 %
Weight Calculi: 7 mg

## 2022-05-23 ENCOUNTER — Telehealth: Payer: Self-pay

## 2022-05-23 NOTE — Telephone Encounter (Signed)
Patient called with some concerns regarding the results he seen in my chart. He advised he wished to speak with you regarding same.     Thank you.

## 2022-05-25 NOTE — Telephone Encounter (Signed)
Patient called and notified of stone results. Will mail dietary instructions to patient,

## 2022-06-01 ENCOUNTER — Ambulatory Visit (INDEPENDENT_AMBULATORY_CARE_PROVIDER_SITE_OTHER): Payer: 59 | Admitting: Physician Assistant

## 2022-06-01 VITALS — BP 155/79 | HR 52 | Ht 70.0 in | Wt 245.0 lb

## 2022-06-01 DIAGNOSIS — R160 Hepatomegaly, not elsewhere classified: Secondary | ICD-10-CM

## 2022-06-01 DIAGNOSIS — N2889 Other specified disorders of kidney and ureter: Secondary | ICD-10-CM

## 2022-06-01 DIAGNOSIS — N200 Calculus of kidney: Secondary | ICD-10-CM | POA: Diagnosis not present

## 2022-06-01 DIAGNOSIS — K769 Liver disease, unspecified: Secondary | ICD-10-CM

## 2022-06-01 LAB — BLADDER SCAN AMB NON-IMAGING: Scan Result: 44

## 2022-06-01 NOTE — Progress Notes (Addendum)
Assessment: 1. Kidney stones - Urinalysis, Routine w reflex microscopic - Ultrasound renal complete; Future - BLADDER SCAN AMB NON-IMAGING  2. Other specified disorders of kidney and ureter - MR Abdomen W Wo Contrast  3. Renal mass  4. Liver masses    Plan: MRI with contrast of the abdomen to further evaluate renal and hepatic masses.  Discussed at length with the patient who agrees.  He will follow-up after imaging study to discuss results and further recommendations.  We will continue stone prevention diet.  Stone analysis indicates calcium oxalate.  Chief Complaint: No chief complaint on file.   HPI: Kyle MichaelisJames Michael Mccarthy is a 66 y.o. male who presents for continued evaluation of right-sided nephrolithiasis.  ESWL performed on 04/05/2022 since his last visit on 5 3, significant flank pain has improved, but he continues to have intermittent discomfort.  No fevers or nausea.  He has passed no additional stone particles.  He states overall he is doing very well without complaints today.  Renal ultrasound performed on 05/12/2022 indicates persistent left-sided nephrolithiasis with no stones identified on the right.  No hydronephrosis appreciated.  He does have a 5 cm hypoechoic mass on the border of the right kidney as well as 2 solid masses noted on the liver.  MRI is recommended.  PVR = 44 mL UA is clear today  04/20/2022 Kyle MichaelisJames Michael Mccarthy is a 66 y.o. male who presents for continued evaluation of nephrolithiasis right kidney.  Patient is status post EWS R performed on 04/05/2022.Marland Kitchen. He continues to have significant R sided flank pain and reports temps up to 100.1 since the EWSL.  No gross hematuria and no passage of fragments thus far.  He denies chills, nausea vomiting.  KUB performed today reveals a 5 mm calcification in the right lower renal pole.  03/29/2022 Kyle MichaelisJames Michael Mccarthy is a 66 y.o. male who presents for continued evaluation of nephrolithiasis.  Patient is status post ESL W on the  left performed on 03/15/2022.  He no longer has pain on the left and thinks he may have passed a few stone fragments but not a lot.  No fever, chills, nausea or vomiting.  No LUTS, no gross hematuria.  He continues to complain of right flank pain of approximately 7/10.  He requests refill of pain medication for upcoming lithotripsy of the right.   UA = clear KUB= previous right renal calculus noted.  Opacification of the area of the left distal ureter previous previously noted is no longer present Portions of the above documentation were copied from a prior visit for review purposes only.  Allergies: Allergies  Allergen Reactions   No Known Allergies     PMH: Past Medical History:  Diagnosis Date   Anxiety    Depression    Hx of blood clots    left leg years ago   Hypertension    Sleep apnea     PSH: Past Surgical History:  Procedure Laterality Date   EXTRACORPOREAL SHOCK WAVE LITHOTRIPSY Left 03/15/2022   Procedure: EXTRACORPOREAL SHOCK WAVE LITHOTRIPSY (ESWL);  Surgeon: Milderd MeagerStoneking, Bradley J., MD;  Location: [facility];  Service: Urology;  Laterality: Left;   EXTRACORPOREAL SHOCK WAVE LITHOTRIPSY Right 04/05/2022   Procedure: EXTRACORPOREAL SHOCK WAVE LITHOTRIPSY (ESWL);  Surgeon: Malen GauzeMcKenzie, Patrick L, MD;  Location: [facility];  Service: Urology;  Laterality: Right;   EYE SURGERY     cateract surgery 20 yrs ago   SKIN CANCER EXCISION      SH: Social History  Tobacco Use   Smoking status: Never   Smokeless tobacco: Never  Vaping Use   Vaping Use: Never used  Substance Use Topics   Alcohol use: Yes    Alcohol/week: 2.0 standard drinks of alcohol    Types: 2 Cans of beer per week    Comment: occassional   Drug use: Never    ROS: See HPI  PE: BP (!) 155/79   Pulse (!) 52   Ht 5\' 10"  (1.778 m)   Wt 245 lb (111.1 kg)   BMI 35.15 kg/m  GENERAL APPEARANCE:  Well appearing, well developed, well nourished, NAD HEENT:  Atraumatic, normocephalic NECK:  Supple. Trachea  midline ABDOMEN:  Soft, non-tender, no masses EXTREMITIES:  Moves all extremities well, without clubbing, cyanosis, or edema NEUROLOGIC:  Alert and oriented x 3, normal gait, CN II-XII grossly intact MENTAL STATUS:  appropriate BACK:  Non-tender to palpation, No CVAT SKIN:  Warm, dry, and intact   Results: Laboratory Data: Lab Results  Component Value Date   WBC 4.5 08/22/2019   HGB 13.5 08/22/2019   HCT 41.4 08/22/2019   MCV 97.0 08/22/2019   PLT 133 (L) 08/22/2019    Lab Results  Component Value Date   CREATININE 1.18 05/12/2022      Lab Results  Component Value Date   HGBA1C 5.3 08/22/2019    Urinalysis    Component Value Date/Time   APPEARANCEUR Clear 04/20/2022 1446   GLUCOSEU Negative 04/20/2022 1446   BILIRUBINUR Negative 04/20/2022 1446   PROTEINUR Negative 04/20/2022 1446   NITRITE Negative 04/20/2022 1446   LEUKOCYTESUR Negative 04/20/2022 1446    Lab Results  Component Value Date   LABMICR See below: 04/20/2022   WBCUA None seen 04/20/2022   LABEPIT 0-10 04/20/2022   MUCUS Many (A) 04/20/2022    Pertinent Imaging:  Results for orders placed during the hospital encounter of 04/20/22  Abdomen 1 view (KUB)  Narrative CLINICAL DATA:  Status post ESWL on right.  EXAM: ABDOMEN - 1 VIEW  COMPARISON:  Abdominal x-ray 04/05/2022, 03/04/2022.  CT 03/09/2022.  FINDINGS: 5 mm calcific density again noted over the right lower renal pole in unchanged position. No evidence of ureteral stone. Previously noted distal left ureteral stone no longer visualized. Aortoiliac and visceral atherosclerotic vascular calcification. Stool noted throughout the colon. No bowel distention. Lumbar spine scoliosis concave left. Stable appearing small sclerotic density noted over the left ilium, possibly a bone island. Degenerative changes lumbar spine and both hips.  IMPRESSION: Stable appearing 5 mm stone again noted over the right lower renal pole. No evidence  of ureteral stone. Previously noted distal left ureteral stone no longer visualized.   Electronically Signed By: 03/11/2022  Register M.D. On: 04/21/2022 07:35  No results found for this or any previous visit.  No results found for this or any previous visit.  No results found for this or any previous visit.  Results for orders placed during the hospital encounter of 05/12/22  Ultrasound renal complete  Narrative CLINICAL DATA:  Intermittent right flank pain. Bilateral lithotripsy.  EXAM: RENAL / URINARY TRACT ULTRASOUND COMPLETE  COMPARISON:  Abdominal radiograph Apr 21, 2022; renal stone CT March 09, 2022  FINDINGS: Right Kidney:  Renal measurements: 13.5 x 7.2 x 7.1 cm = volume: 361.5 mL. Normal renal cortical thickness and echogenicity. No hydronephrosis. There is a 5.1 x 2.2 x 4.0 cm hypoechoic mass within the right kidney.  Left Kidney:  Renal measurements: 12.4 x 6.5 x 5.5 cm = volume: 232.5  mL. Normal renal cortical thickness and echogenicity. No hydronephrosis. There is a 7 mm shadowing echogenic foci compatible with a stone.  Bladder:  Appears normal for degree of bladder distention.  Other:  There is a 3.5 x 4.8 x 3.8 cm mixed echogenicity solid mass within the liver. There is an additional 1.8 x 1.7 x 1.9 cm hypoechoic mass within the liver.  IMPRESSION: 1. There are 2 indeterminate solid masses within the liver measuring 3.5 cm and 1.9 cm. These needs dedicated evaluation with pre and post contrast-enhanced abdominal MRI. 2. There is an indeterminate 5 cm hypoechoic mass along the border of the right kidney which is nonspecific. This may represent postprocedural hematoma or potentially a solid mass. Attention on recommended abdominal MRI. 3. Left nephrolithiasis. 4. These results will be called to the ordering clinician or representative by the Radiologist Assistant, and communication documented in the PACS or [employer].   Electronically  Signed By: Annia Belt M.D. On: 05/13/2022 10:51  No results found for this or any previous visit.  No results found for this or any previous visit.  Results for orders placed in visit on 03/09/22  CT RENAL STONE STUDY  Narrative CLINICAL DATA:  Flank pain, history of nephrolithiasis, left lower quadrant pain for 1 week  EXAM: CT ABDOMEN AND PELVIS WITHOUT CONTRAST  TECHNIQUE: Multidetector CT imaging of the abdomen and pelvis was performed following the standard protocol without IV contrast.  RADIATION DOSE REDUCTION: This exam was performed according to the departmental dose-optimization program which includes automated exposure control, adjustment of the mA and/or kV according to patient size and/or use of iterative reconstruction technique.  COMPARISON:  None available  FINDINGS: Lower chest: Scattered areas of bibasilar atelectasis. Subcentimeter calcified granuloma in the left lower lobe. Mild cardiac enlargement. No pericardial or pleural effusion.  Hepatobiliary: Limited without IV contrast. Round 4.4 cm hypodense lesion in the right liver posteriorly has relatively low Hounsfield measurements, image 17/2, but remains indeterminate by noncontrast imaging. This could be further evaluated with abdominal ultrasound to determine if the lesion is solid or cystic. There is an additional ill-defined subcapsular hypodense lesion more inferior in the right liver measuring 2.1 cm, image 27/2.  No biliary dilatation or obstruction pattern.  Cholelithiasis noted. Gallbladder nondistended. Common bile duct nondilated.  Pancreas: Unremarkable. No pancreatic ductal dilatation or surrounding inflammatory changes.  Spleen: Normal in size without focal abnormality.  Adrenals/Urinary Tract: Normal adrenal glands.  Kidneys demonstrate tiny punctate nonobstructing intrarenal calculi bilaterally. No acute hydronephrosis. Ureters are symmetric and decompressed.  However,  the left distal ureter does demonstrate a nonobstructing 5 mm calculus, image 73/2 in the pelvis very close to the left UVJ.  Bladder unremarkable.  Stomach/Bowel: Negative for bowel obstruction, significant dilatation, ileus, or free air. Normal appendix demonstrated. Scattered left colon and sigmoid diverticulosis without acute inflammatory process.  No free fluid, fluid collection, hemorrhage, hematoma, abscess or ascites.  Vascular/Lymphatic: Limited without IV contrast. Aorta atherosclerotic. Negative for aneurysm. No retroperitoneal hemorrhage or hematoma.  No bulky adenopathy.  Nonspecific small nonenlarged mesenteric root lymph nodes with slight mesenteric haziness, image 34/2. This can be seen with mesenteric adenitis/panniculitis.  Reproductive: Prostate gland is mildly enlarged. Seminal vesicles are symmetric. Prostate calcifications noted.  Other: No abdominal wall hernia or abnormality. No abdominopelvic ascites.  Musculoskeletal: Degenerative changes throughout the spine. Lower lumbar facet arthropathy. L5-S1 advanced degenerative disc disease noted. No acute osseous finding.  IMPRESSION: 5 mm nonobstructing left distal ureteral calculus close to the left UVJ.  Additional nonobstructing punctate intrarenal calculi bilaterally.  Right liver indeterminate hypodense lesions measuring up to 4.4 cm. Consider initial evaluation with nonemergent follow-up abdominal ultrasound to determine if solid or cystic.  Cholelithiasis  Aortic Atherosclerosis (ICD10-I70.0).   Electronically Signed By: Judie Petit.  Shick M.D. On: 03/09/2022 10:37  No results found for this or any previous visit (from the past 24 hour(s)).

## 2022-06-01 NOTE — Progress Notes (Signed)
post void residual=44mL 

## 2022-06-02 LAB — URINALYSIS, ROUTINE W REFLEX MICROSCOPIC
Bilirubin, UA: NEGATIVE
Glucose, UA: NEGATIVE
Ketones, UA: NEGATIVE
Leukocytes,UA: NEGATIVE
Nitrite, UA: NEGATIVE
Protein,UA: NEGATIVE
Specific Gravity, UA: 1.02 (ref 1.005–1.030)
Urobilinogen, Ur: 1 mg/dL (ref 0.2–1.0)
pH, UA: 6 (ref 5.0–7.5)

## 2022-06-02 LAB — MICROSCOPIC EXAMINATION
Bacteria, UA: NONE SEEN
Epithelial Cells (non renal): NONE SEEN /hpf (ref 0–10)

## 2022-06-03 ENCOUNTER — Telehealth: Payer: Self-pay | Admitting: Neurology

## 2022-06-03 NOTE — Telephone Encounter (Signed)
Pt is asking for a call with results to EEG 

## 2022-06-05 ENCOUNTER — Ambulatory Visit (HOSPITAL_COMMUNITY)
Admission: RE | Admit: 2022-06-05 | Discharge: 2022-06-05 | Disposition: A | Discharge status: Home / Self Care | Payer: 59 | Source: Ambulatory Visit | Attending: Physician Assistant | Admitting: Physician Assistant

## 2022-06-05 DIAGNOSIS — N2889 Other specified disorders of kidney and ureter: Secondary | ICD-10-CM | POA: Insufficient documentation

## 2022-06-05 MED ORDER — GADOBUTROL 1 MMOL/ML IV SOLN
15.0000 mL | Freq: Once | INTRAVENOUS | Status: AC | PRN
  Administered 2022-06-05: 10 mL via INTRAVENOUS

## 2022-06-07 NOTE — Procedures (Signed)
   HISTORY: 66 year old with anxiety, memory loss  TECHNIQUE:  This is a routine 16 channel EEG recording with one channel devoted to a limited EKG recording.  It was performed during wakefulness, drowsiness and asleep.  Hyperventilation and photic stimulation were performed as activating procedures.  There are minimum muscle and movement artifact noted.  Upon maximum arousal, posterior dominant waking rhythm consistent of rhythmic alpha range activity. Activities are symmetric over the bilateral posterior derivations and attenuated with eye opening.  Hyperventilation produced mild/moderate buildup with higher amplitude and the slower activities noted.  Photic stimulation did not alter the tracing.  During EEG recording, patient developed drowsiness and entered sleep, sleep EEG demonstrated architecture, there were frontal centrally dominant vertex waves and symmetric sleep spindles noted.  During EEG recording, there was no epileptiform discharge noted.  EKG demonstrate sinus rhythm, with heart rate of 48 bpm  CONCLUSION: This is a  normal awake and sleep EEG.  There is no electrodiagnostic evidence of epileptiform discharge.  Levert Feinstein, M.D. Ph.D.  [facility] [address] [place], [place] [address] Phone: [phone] Fax:      [phone]

## 2022-06-07 NOTE — Telephone Encounter (Signed)
The patient called and asked me to send his MRI to [facility] because [facility] still has not scheduled him. I sent the MRI to the [facility] work queue and sent him a message with the centralized scheduling phone number.

## 2022-06-07 NOTE — Telephone Encounter (Signed)
Please call patient EEG was normal 

## 2022-06-08 NOTE — Telephone Encounter (Signed)
Pt verified by name and DOB,  normal results given per provider, pt voiced understanding all question answered. °

## 2022-06-12 ENCOUNTER — Ambulatory Visit (HOSPITAL_COMMUNITY)
Admission: RE | Admit: 2022-06-12 | Discharge: 2022-06-12 | Disposition: A | Discharge status: Home / Self Care | Payer: 59 | Source: Ambulatory Visit | Attending: Neurology | Admitting: Neurology

## 2022-06-12 DIAGNOSIS — R4189 Other symptoms and signs involving cognitive functions and awareness: Secondary | ICD-10-CM | POA: Diagnosis present

## 2022-06-12 MED ORDER — GADOBUTROL 1 MMOL/ML IV SOLN
10.0000 mL | Freq: Once | INTRAVENOUS | Status: AC | PRN
  Administered 2022-06-12: 10 mL via INTRAVENOUS

## 2022-06-24 ENCOUNTER — Ambulatory Visit (HOSPITAL_COMMUNITY)
Admission: RE | Admit: 2022-06-24 | Discharge: 2022-06-24 | Disposition: A | Discharge status: Home / Self Care | Payer: 59 | Source: Ambulatory Visit | Attending: Physician Assistant | Admitting: Physician Assistant

## 2022-06-24 DIAGNOSIS — N200 Calculus of kidney: Secondary | ICD-10-CM | POA: Diagnosis present

## 2022-06-30 ENCOUNTER — Ambulatory Visit: Payer: 59 | Admitting: Physician Assistant

## 2022-06-30 VITALS — BP 133/71 | HR 49

## 2022-06-30 DIAGNOSIS — N200 Calculus of kidney: Secondary | ICD-10-CM | POA: Diagnosis not present

## 2022-06-30 DIAGNOSIS — R160 Hepatomegaly, not elsewhere classified: Secondary | ICD-10-CM | POA: Diagnosis not present

## 2022-06-30 DIAGNOSIS — S37011A Minor contusion of right kidney, initial encounter: Secondary | ICD-10-CM | POA: Diagnosis not present

## 2022-06-30 DIAGNOSIS — R109 Unspecified abdominal pain: Secondary | ICD-10-CM

## 2022-06-30 LAB — URINALYSIS, ROUTINE W REFLEX MICROSCOPIC
Bilirubin, UA: NEGATIVE
Glucose, UA: NEGATIVE
Ketones, UA: NEGATIVE
Leukocytes,UA: NEGATIVE
Nitrite, UA: NEGATIVE
Protein,UA: NEGATIVE
RBC, UA: NEGATIVE
Specific Gravity, UA: 1.02 (ref 1.005–1.030)
Urobilinogen, Ur: 1 mg/dL (ref 0.2–1.0)
pH, UA: 5.5 (ref 5.0–7.5)

## 2022-06-30 MED ORDER — METHOCARBAMOL 500 MG PO TABS: 500.0000 mg | ORAL_TABLET | Freq: Every day | ORAL | 0 refills | Status: DC

## 2022-06-30 NOTE — Patient Instructions (Signed)
Tylenol as needed for pain. No antiinflammatories

## 2022-06-30 NOTE — Progress Notes (Signed)
Assessment: 1. Hematoma of right kidney, initial encounter - Ultrasound renal complete; Future - Urinalysis, Routine w reflex microscopic  2. Kidney stones  3. Liver masses  4. Right flank pain    Plan: Fax MRI and [place] to PCP for eval of liver hemangiomas.  Renal ultrasound follow-up in 4 to 6 weeks to assess hematoma seen on the MRI and ultrasound this week.  Patient is advised to continue his increase in fluids and should he develop significant flank pain, fever, chills, nausea or vomiting with the pain, he should seek emergent evaluation in the ED.  He is advised to avoid anti-inflammatories which could increase the risk of bleeding and will stick with Tylenol as needed.  Robaxin prescription given to take as needed if he is having discomfort at night.  Images discussed and reviewed with the patient in the exam room during his office visit.  Chief Complaint: No chief complaint on file.   HPI: Kyle Mccarthy is a 66 y.o. male who presents for continued evaluation of nephrolithiasis. Left UVJ ESL performed on on 04/05/2022.  He continues to have intermittent severe right-sided flank pain which does not radiate.  No gross hematuria no fever, chills, nausea or vomiting.  Nothing worsens pain.  Ibuprofen or occasional Detrol improves discomfort.  MRI of the abdomen performed on 06/05/22 in follow-up to findings seen on the liver and right kidney on previous ultrasound.  Results indicate probable hemangiomas on the liver and hematoma on the right kidney.  Remains on Xarelto.  Ultrasound obtained on 06/24/2022 shows no evidence of hydronephrosis, but opaque mass remains on the right kidney consistent with hematoma.  Liver findings also discussed with the patient who will see primary care for follow-up of this.  The patient recalls being treated for hepatitis when he was 15.  He is advised to share this with his primary care as well. UA = clear today   06/01/2022 Kyle Mccarthy is a 66 y.o.  male who presents for continued evaluation of right-sided nephrolithiasis.  ESWL performed on 04/05/2022 since his last visit on 5 3, significant flank pain has improved, but he continues to have intermittent discomfort.  No fevers or nausea.  He has passed no additional stone particles.  He states overall he is doing very well without complaints today.  Renal ultrasound performed on 05/12/2022 indicates persistent left-sided nephrolithiasis with no stones identified on the right.  No hydronephrosis appreciated.  He does have a 5 cm hypoechoic mass on the border of the right kidney as well as 2 solid masses noted on the liver.  MRI is recommended.  PVR = 44 mL UA is clear today   04/20/2022 Kyle Mccarthy is a 66 y.o. male who presents for continued evaluation of nephrolithiasis right kidney.  Patient is status post EWS R performed on 04/05/2022.Marland Kitchen He continues to have significant R sided flank pain and reports temps up to 100.1 since the EWSL.  No gross hematuria and no passage of fragments thus far.  He denies chills, nausea vomiting.  KUB performed today reveals a 5 mm calcification in the right lower renal pole.   03/29/2022 Kyle Mccarthy is a 66 y.o. male who presents for continued evaluation of nephrolithiasis.  Patient is status post ESL W on the left performed on 03/15/2022.  He no longer has pain on the left and thinks he may have passed a few stone fragments but not a lot.  No fever, chills, nausea or vomiting.  No  LUTS, no gross hematuria.  He continues to complain of right flank pain of approximately 7/10.  He requests refill of pain medication for upcoming lithotripsy of the right.   UA = clear KUB= previous right renal calculus noted.  Opacification of the area of the left distal ureter previous previously noted is no longer present Portions of the above documentation were copied from a prior visit for review purposes only.  Allergies: Allergies  Allergen Reactions   No Known Allergies      PMH: Past Medical History:  Diagnosis Date   Anxiety    Depression    Hx of blood clots    left leg years ago   Hypertension    Sleep apnea     PSH: Past Surgical History:  Procedure Laterality Date   EXTRACORPOREAL SHOCK WAVE LITHOTRIPSY Left 03/15/2022   Procedure: EXTRACORPOREAL SHOCK WAVE LITHOTRIPSY (ESWL);  Surgeon: Milderd Meager., MD;  Location: [facility];  Service: Urology;  Laterality: Left;   EXTRACORPOREAL SHOCK WAVE LITHOTRIPSY Right 04/05/2022   Procedure: EXTRACORPOREAL SHOCK WAVE LITHOTRIPSY (ESWL);  Surgeon: Malen Gauze, MD;  Location: [facility];  Service: Urology;  Laterality: Right;   EYE SURGERY     cateract surgery 20 yrs ago   SKIN CANCER EXCISION      SH: Social History   Tobacco Use   Smoking status: Never   Smokeless tobacco: Never  Vaping Use   Vaping Use: Never used  Substance Use Topics   Alcohol use: Yes    Alcohol/week: 2.0 standard drinks of alcohol    Types: 2 Cans of beer per week    Comment: occassional   Drug use: Never    ROS: All other review of systems were reviewed and are negative except what is noted above in HPI  PE: There were no vitals taken for this visit. GENERAL APPEARANCE:  Well appearing, well developed, well nourished, NAD HEENT:  Atraumatic, normocephalic NECK:  Supple. Trachea midline ABDOMEN:  Soft, non-tender, no masses EXTREMITIES:  Moves all extremities well, without clubbing, cyanosis, or edema NEUROLOGIC:  Alert and oriented x 3, normal gait, CN II-XII grossly intact MENTAL STATUS:  appropriate BACK:  Non-tender to palpation, No CVAT SKIN:  Warm, dry, and intact   Results: Laboratory Data: Lab Results  Component Value Date   WBC 4.5 08/22/2019   HGB 13.5 08/22/2019   HCT 41.4 08/22/2019   MCV 97.0 08/22/2019   PLT 133 (L) 08/22/2019    Lab Results  Component Value Date   CREATININE 1.18 05/12/2022    No results found for: "PSA"  No results found for: "TESTOSTERONE"  Lab  Results  Component Value Date   HGBA1C 5.3 08/22/2019    Urinalysis    Component Value Date/Time   APPEARANCEUR Clear 06/01/2022 1515   GLUCOSEU Negative 06/01/2022 1515   BILIRUBINUR Negative 06/01/2022 1515   PROTEINUR Negative 06/01/2022 1515   NITRITE Negative 06/01/2022 1515   LEUKOCYTESUR Negative 06/01/2022 1515    Lab Results  Component Value Date   LABMICR See below: 06/01/2022   WBCUA 0-5 06/01/2022   LABEPIT None seen 06/01/2022   MUCUS Many (A) 04/20/2022   BACTERIA None seen 06/01/2022    Pertinent Imaging: Results for orders placed during the hospital encounter of 04/20/22  Abdomen 1 view (KUB)  Narrative CLINICAL DATA:  Status post ESWL on right.  EXAM: ABDOMEN - 1 VIEW  COMPARISON:  Abdominal x-ray 04/05/2022, 03/04/2022.  CT 03/09/2022.  FINDINGS: 5 mm calcific density again noted  over the right lower renal pole in unchanged position. No evidence of ureteral stone. Previously noted distal left ureteral stone no longer visualized. Aortoiliac and visceral atherosclerotic vascular calcification. Stool noted throughout the colon. No bowel distention. Lumbar spine scoliosis concave left. Stable appearing small sclerotic density noted over the left ilium, possibly a bone island. Degenerative changes lumbar spine and both hips.  IMPRESSION: Stable appearing 5 mm stone again noted over the right lower renal pole. No evidence of ureteral stone. Previously noted distal left ureteral stone no longer visualized.   Electronically Signed By: Maisie Fus  Register M.D. On: 04/21/2022 07:35  No results found for this or any previous visit.  No results found for this or any previous visit.  No results found for this or any previous visit.  Results for orders placed during the hospital encounter of 06/24/22  Ultrasound renal complete  Narrative CLINICAL DATA:  Nephrolithiasis, status post ESWL  EXAM: RENAL / URINARY TRACT ULTRASOUND  COMPLETE  COMPARISON:  03/09/2022, 05/12/2022  FINDINGS: Right Kidney:  Renal measurements: 12.5 x 5.5 x 5.3 cm = volume: 192 mL. Echogenicity within normal limits. No mass or hydronephrosis visualized. Upper pole anechoic cortical cyst measures 2.2 cm.  Left Kidney:  Renal measurements: 12.0 x 5.8 x 5.4 cm = volume: 198 mL. Echogenicity within normal limits. No mass or hydronephrosis visualized. Possible midpole subcentimeter echogenic shadowing calculus measures 7 mm.  Bladder:  Appears normal for degree of bladder distention.  Other:  None.  IMPRESSION: No acute finding by ultrasound or hydronephrosis. Additional findings as above.   Electronically Signed By: Judie Petit.  Shick M.D. On: 06/26/2022 09:38  No results found for this or any previous visit.  No results found for this or any previous visit.  Results for orders placed in visit on 03/09/22  CT RENAL STONE STUDY  Narrative CLINICAL DATA:  Flank pain, history of nephrolithiasis, left lower quadrant pain for 1 week  EXAM: CT ABDOMEN AND PELVIS WITHOUT CONTRAST  TECHNIQUE: Multidetector CT imaging of the abdomen and pelvis was performed following the standard protocol without IV contrast.  RADIATION DOSE REDUCTION: This exam was performed according to the departmental dose-optimization program which includes automated exposure control, adjustment of the mA and/or kV according to patient size and/or use of iterative reconstruction technique.  COMPARISON:  None available  FINDINGS: Lower chest: Scattered areas of bibasilar atelectasis. Subcentimeter calcified granuloma in the left lower lobe. Mild cardiac enlargement. No pericardial or pleural effusion.  Hepatobiliary: Limited without IV contrast. Round 4.4 cm hypodense lesion in the right liver posteriorly has relatively low Hounsfield measurements, image 17/2, but remains indeterminate by noncontrast imaging. This could be further evaluated with  abdominal ultrasound to determine if the lesion is solid or cystic. There is an additional ill-defined subcapsular hypodense lesion more inferior in the right liver measuring 2.1 cm, image 27/2.  No biliary dilatation or obstruction pattern.  Cholelithiasis noted. Gallbladder nondistended. Common bile duct nondilated.  Pancreas: Unremarkable. No pancreatic ductal dilatation or surrounding inflammatory changes.  Spleen: Normal in size without focal abnormality.  Adrenals/Urinary Tract: Normal adrenal glands.  Kidneys demonstrate tiny punctate nonobstructing intrarenal calculi bilaterally. No acute hydronephrosis. Ureters are symmetric and decompressed.  However, the left distal ureter does demonstrate a nonobstructing 5 mm calculus, image 73/2 in the pelvis very close to the left UVJ.  Bladder unremarkable.  Stomach/Bowel: Negative for bowel obstruction, significant dilatation, ileus, or free air. Normal appendix demonstrated. Scattered left colon and sigmoid diverticulosis without acute inflammatory process.  No  free fluid, fluid collection, hemorrhage, hematoma, abscess or ascites.  Vascular/Lymphatic: Limited without IV contrast. Aorta atherosclerotic. Negative for aneurysm. No retroperitoneal hemorrhage or hematoma.  No bulky adenopathy.  Nonspecific small nonenlarged mesenteric root lymph nodes with slight mesenteric haziness, image 34/2. This can be seen with mesenteric adenitis/panniculitis.  Reproductive: Prostate gland is mildly enlarged. Seminal vesicles are symmetric. Prostate calcifications noted.  Other: No abdominal wall hernia or abnormality. No abdominopelvic ascites.  Musculoskeletal: Degenerative changes throughout the spine. Lower lumbar facet arthropathy. L5-S1 advanced degenerative disc disease noted. No acute osseous finding.  IMPRESSION: 5 mm nonobstructing left distal ureteral calculus close to the left UVJ.  Additional nonobstructing  punctate intrarenal calculi bilaterally.  Right liver indeterminate hypodense lesions measuring up to 4.4 cm. Consider initial evaluation with nonemergent follow-up abdominal ultrasound to determine if solid or cystic.  Cholelithiasis  Aortic Atherosclerosis (ICD10-I70.0).   Electronically Signed By: Judie Petit.  Shick M.D. On: 03/09/2022 10:37  No results found for this or any previous visit (from the past 24 hour(s)).

## 2022-08-03 ENCOUNTER — Encounter: Payer: Self-pay | Admitting: Physician Assistant

## 2022-08-03 ENCOUNTER — Ambulatory Visit (HOSPITAL_COMMUNITY)
Admission: RE | Admit: 2022-08-03 | Discharge: 2022-08-03 | Disposition: A | Discharge status: Home / Self Care | Payer: 59 | Source: Ambulatory Visit | Attending: Physician Assistant | Admitting: Physician Assistant

## 2022-08-03 ENCOUNTER — Ambulatory Visit (INDEPENDENT_AMBULATORY_CARE_PROVIDER_SITE_OTHER): Payer: 59 | Admitting: Physician Assistant

## 2022-08-03 VITALS — BP 161/82 | HR 48

## 2022-08-03 DIAGNOSIS — N200 Calculus of kidney: Secondary | ICD-10-CM

## 2022-08-03 DIAGNOSIS — S37099A Other injury of unspecified kidney, initial encounter: Secondary | ICD-10-CM | POA: Diagnosis not present

## 2022-08-03 DIAGNOSIS — S37011A Minor contusion of right kidney, initial encounter: Secondary | ICD-10-CM | POA: Diagnosis present

## 2022-08-03 DIAGNOSIS — R109 Unspecified abdominal pain: Secondary | ICD-10-CM

## 2022-08-03 DIAGNOSIS — N2889 Other specified disorders of kidney and ureter: Secondary | ICD-10-CM

## 2022-08-03 NOTE — Progress Notes (Signed)
Assessment: 1. Right flank pain - Ultrasound renal complete; Future  2. Kidney stones - Ultrasound renal complete; Future  3. Other specified disorders of kidney and ureter    Plan: Patient is reassured that no additional imaging indicated for the hematoma.  He will continue Flomax daily and follow-up in 6 months after renal ultrasound.  Stone prevention diet recommendations again given.  Chief Complaint: No chief complaint on file.   HPI: Kyle Mccarthy is a 66 y.o. male who presents for continued evaluation of nephrolithiasis and renal hematoma post ESWL.  Symptoms of flank pain have improved significantly, but he continues to have occasional right flank pain.  Minimal LUTS today.  No gross hematuria.  Renal ultrasound reveals resolving hematoma now minimal in size..  06/30/22 Kyle Mccarthy is a 66 y.o. male who presents for continued evaluation of nephrolithiasis. Left UVJ ESL performed on on 04/05/2022.  He continues to have intermittent severe right-sided flank pain which does not radiate.  No gross hematuria no fever, chills, nausea or vomiting.  Nothing worsens pain.  Ibuprofen or occasional Detrol improves discomfort.  MRI of the abdomen performed on 06/05/22 in follow-up to findings seen on the liver and right kidney on previous ultrasound.  Results indicate probable hemangiomas on the liver and hematoma on the right kidney.  Remains on Xarelto.  Ultrasound obtained on 06/24/2022 shows no evidence of hydronephrosis, but opaque mass remains on the right kidney consistent with hematoma.  Liver findings also discussed with the patient who will see primary care for follow-up of this.  The patient recalls being treated for hepatitis when he was 15.  He is advised to share this with his primary care as well. UA = clear today    06/01/2022 Kyle Mccarthy is a 66 y.o. male who presents for continued evaluation of right-sided nephrolithiasis.  ESWL performed on 04/05/2022 since  his last visit on 5 3, significant flank pain has improved, but he continues to have intermittent discomfort.  No fevers or nausea.  He has passed no additional stone particles.  He states overall he is doing very well without complaints today.  Renal ultrasound performed on 05/12/2022 indicates persistent left-sided nephrolithiasis with no stones identified on the right.  No hydronephrosis appreciated.  He does have a 5 cm hypoechoic mass on the border of the right kidney as well as 2 solid masses noted on the liver.  MRI is recommended.  PVR = 44 mL UA is clear today   04/25/22 Kyle Mccarthy is a 66 y.o. male who presents for continued evaluation of nephrolithiasis. Left UVJ ESL performed on on 04/05/2022.  He continues to have intermittent severe right-sided flank pain which does not radiate.  No gross hematuria no fever, chills, nausea or vomiting.  Nothing worsens pain.  Ibuprofen or occasional Detrol improves discomfort.  MRI of the abdomen performed on 06/05/22 in follow-up to findings seen on the liver and right kidney on previous ultrasound.  Results indicate probable hemangiomas on the liver and hematoma on the right kidney.  Remains on Xarelto.  Ultrasound obtained on 06/24/2022 shows no evidence of hydronephrosis, but opaque mass remains on the right kidney consistent with hematoma.  Liver findings also discussed with the patient who will see primary care for follow-up of this.  The patient recalls being treated for hepatitis when he was 15.  He is advised to share this with his primary care as well. UA = clear today   04/20/2022 Kyle Mccarthy  is a 66 y.o. male who presents for continued evaluation of nephrolithiasis right kidney.  Patient is status post EWS R performed on 04/05/2022.Marland Kitchen He continues to have significant R sided flank pain and reports temps up to 100.1 since the EWSL.  No gross hematuria and no passage of fragments thus far.  He denies chills, nausea vomiting.  KUB performed today  reveals a 5 mm calcification in the right lower renal pole.   03/29/2022 Kyle Mccarthy is a 66 y.o. male who presents for continued evaluation of nephrolithiasis.  Patient is status post ESL W on the left performed on 03/15/2022.  He no longer has pain on the left and thinks he may have passed a few stone fragments but not a lot.  No fever, chills, nausea or vomiting.  No LUTS, no gross hematuria.  He continues to complain of right flank pain of approximately 7/10.  He requests refill of pain medication for upcoming lithotripsy of the right.   UA = clear KUB= previous right renal calculus noted.  Opacification of the area of the left distal ureter previous previously noted is no longer present Portions of the above documentation were copied from a prior visit for review purposes only.  Allergies: Allergies  Allergen Reactions   No Known Allergies     PMH: Past Medical History:  Diagnosis Date   Anxiety    Depression    Hx of blood clots    left leg years ago   Hypertension    Sleep apnea     PSH: Past Surgical History:  Procedure Laterality Date   EXTRACORPOREAL SHOCK WAVE LITHOTRIPSY Left 03/15/2022   Procedure: EXTRACORPOREAL SHOCK WAVE LITHOTRIPSY (ESWL);  Surgeon: Milderd Meager., MD;  Location: [facility];  Service: Urology;  Laterality: Left;   EXTRACORPOREAL SHOCK WAVE LITHOTRIPSY Right 04/05/2022   Procedure: EXTRACORPOREAL SHOCK WAVE LITHOTRIPSY (ESWL);  Surgeon: Malen Gauze, MD;  Location: [facility];  Service: Urology;  Laterality: Right;   EYE SURGERY     cateract surgery 20 yrs ago   SKIN CANCER EXCISION      SH: Social History   Tobacco Use   Smoking status: Never   Smokeless tobacco: Never  Vaping Use   Vaping Use: Never used  Substance Use Topics   Alcohol use: Yes    Alcohol/week: 2.0 standard drinks of alcohol    Types: 2 Cans of beer per week    Comment: occassional   Drug use: Never    ROS: All other review of systems were reviewed and  are negative except what is noted above in HPI  PE: BP (!) 161/82   Pulse (!) 48  GENERAL APPEARANCE:  Well appearing, well developed, well nourished, NAD HEENT:  Atraumatic, normocephalic NECK:  Supple. Trachea midline ABDOMEN:  Soft, non-tender, no masses EXTREMITIES:  Moves all extremities well, without clubbing, cyanosis, or edema NEUROLOGIC:  Alert and oriented x 3, normal gait, CN II-XII grossly intact MENTAL STATUS:  appropriate BACK:  Non-tender to palpation, No CVAT SKIN:  Warm, dry, and intact   Results: Laboratory Data: Lab Results  Component Value Date   WBC 4.5 08/22/2019   HGB 13.5 08/22/2019   HCT 41.4 08/22/2019   MCV 97.0 08/22/2019   PLT 133 (L) 08/22/2019    Lab Results  Component Value Date   CREATININE 1.18 05/12/2022    No results found for: "PSA"  No results found for: "TESTOSTERONE"  Lab Results  Component Value Date   HGBA1C 5.3 08/22/2019  Urinalysis    Component Value Date/Time   APPEARANCEUR Clear 06/30/2022 1026   GLUCOSEU Negative 06/30/2022 1026   BILIRUBINUR Negative 06/30/2022 1026   PROTEINUR Negative 06/30/2022 1026   NITRITE Negative 06/30/2022 1026   LEUKOCYTESUR Negative 06/30/2022 1026    Lab Results  Component Value Date   LABMICR Comment 06/30/2022   WBCUA 0-5 06/01/2022   LABEPIT None seen 06/01/2022   MUCUS Many (A) 04/20/2022   BACTERIA None seen 06/01/2022    Pertinent Imaging: Results for orders placed during the hospital encounter of 04/20/22  Abdomen 1 view (KUB)  Narrative CLINICAL DATA:  Status post ESWL on right.  EXAM: ABDOMEN - 1 VIEW  COMPARISON:  Abdominal x-ray 04/05/2022, 03/04/2022.  CT 03/09/2022.  FINDINGS: 5 mm calcific density again noted over the right lower renal pole in unchanged position. No evidence of ureteral stone. Previously noted distal left ureteral stone no longer visualized. Aortoiliac and visceral atherosclerotic vascular calcification. Stool noted throughout  the colon. No bowel distention. Lumbar spine scoliosis concave left. Stable appearing small sclerotic density noted over the left ilium, possibly a bone island. Degenerative changes lumbar spine and both hips.  IMPRESSION: Stable appearing 5 mm stone again noted over the right lower renal pole. No evidence of ureteral stone. Previously noted distal left ureteral stone no longer visualized.   Electronically Signed By: Maisie Fus  Register M.D. On: 04/21/2022 07:35  No results found for this or any previous visit.  No results found for this or any previous visit.  No results found for this or any previous visit.  Results for orders placed during the hospital encounter of 06/24/22  Ultrasound renal complete  Narrative CLINICAL DATA:  Nephrolithiasis, status post ESWL  EXAM: RENAL / URINARY TRACT ULTRASOUND COMPLETE  COMPARISON:  03/09/2022, 05/12/2022  FINDINGS: Right Kidney:  Renal measurements: 12.5 x 5.5 x 5.3 cm = volume: 192 mL. Echogenicity within normal limits. No mass or hydronephrosis visualized. Upper pole anechoic cortical cyst measures 2.2 cm.  Left Kidney:  Renal measurements: 12.0 x 5.8 x 5.4 cm = volume: 198 mL. Echogenicity within normal limits. No mass or hydronephrosis visualized. Possible midpole subcentimeter echogenic shadowing calculus measures 7 mm.  Bladder:  Appears normal for degree of bladder distention.  Other:  None.  IMPRESSION: No acute finding by ultrasound or hydronephrosis. Additional findings as above.   Electronically Signed By: Judie Petit.  Shick M.D. On: 06/26/2022 09:38  No results found for this or any previous visit.  No results found for this or any previous visit.  Results for orders placed in visit on 03/09/22  CT RENAL STONE STUDY  Narrative CLINICAL DATA:  Flank pain, history of nephrolithiasis, left lower quadrant pain for 1 week  EXAM: CT ABDOMEN AND PELVIS WITHOUT CONTRAST  TECHNIQUE: Multidetector CT  imaging of the abdomen and pelvis was performed following the standard protocol without IV contrast.  RADIATION DOSE REDUCTION: This exam was performed according to the departmental dose-optimization program which includes automated exposure control, adjustment of the mA and/or kV according to patient size and/or use of iterative reconstruction technique.  COMPARISON:  None available  FINDINGS: Lower chest: Scattered areas of bibasilar atelectasis. Subcentimeter calcified granuloma in the left lower lobe. Mild cardiac enlargement. No pericardial or pleural effusion.  Hepatobiliary: Limited without IV contrast. Round 4.4 cm hypodense lesion in the right liver posteriorly has relatively low Hounsfield measurements, image 17/2, but remains indeterminate by noncontrast imaging. This could be further evaluated with abdominal ultrasound to determine if the lesion is  solid or cystic. There is an additional ill-defined subcapsular hypodense lesion more inferior in the right liver measuring 2.1 cm, image 27/2.  No biliary dilatation or obstruction pattern.  Cholelithiasis noted. Gallbladder nondistended. Common bile duct nondilated.  Pancreas: Unremarkable. No pancreatic ductal dilatation or surrounding inflammatory changes.  Spleen: Normal in size without focal abnormality.  Adrenals/Urinary Tract: Normal adrenal glands.  Kidneys demonstrate tiny punctate nonobstructing intrarenal calculi bilaterally. No acute hydronephrosis. Ureters are symmetric and decompressed.  However, the left distal ureter does demonstrate a nonobstructing 5 mm calculus, image 73/2 in the pelvis very close to the left UVJ.  Bladder unremarkable.  Stomach/Bowel: Negative for bowel obstruction, significant dilatation, ileus, or free air. Normal appendix demonstrated. Scattered left colon and sigmoid diverticulosis without acute inflammatory process.  No free fluid, fluid collection, hemorrhage,  hematoma, abscess or ascites.  Vascular/Lymphatic: Limited without IV contrast. Aorta atherosclerotic. Negative for aneurysm. No retroperitoneal hemorrhage or hematoma.  No bulky adenopathy.  Nonspecific small nonenlarged mesenteric root lymph nodes with slight mesenteric haziness, image 34/2. This can be seen with mesenteric adenitis/panniculitis.  Reproductive: Prostate gland is mildly enlarged. Seminal vesicles are symmetric. Prostate calcifications noted.  Other: No abdominal wall hernia or abnormality. No abdominopelvic ascites.  Musculoskeletal: Degenerative changes throughout the spine. Lower lumbar facet arthropathy. L5-S1 advanced degenerative disc disease noted. No acute osseous finding.  IMPRESSION: 5 mm nonobstructing left distal ureteral calculus close to the left UVJ.  Additional nonobstructing punctate intrarenal calculi bilaterally.  Right liver indeterminate hypodense lesions measuring up to 4.4 cm. Consider initial evaluation with nonemergent follow-up abdominal ultrasound to determine if solid or cystic.  Cholelithiasis  Aortic Atherosclerosis (ICD10-I70.0).   Electronically Signed By: Judie Petit.  Shick M.D. On: 03/09/2022 10:37  No results found for this or any previous visit (from the past 24 hour(s)).

## 2022-08-10 ENCOUNTER — Ambulatory Visit: Payer: 59 | Admitting: Urology

## 2022-08-22 ENCOUNTER — Other Ambulatory Visit: Payer: Self-pay | Admitting: Physician Assistant
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
## 2022-09-15 ENCOUNTER — Ambulatory Visit: Payer: 59 | Admitting: Adult Health
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
## 2022-10-05 ENCOUNTER — Telehealth: Payer: Self-pay | Admitting: Adult Health

## 2022-10-05 ENCOUNTER — Ambulatory Visit (INDEPENDENT_AMBULATORY_CARE_PROVIDER_SITE_OTHER): Payer: 59 | Admitting: Adult Health

## 2022-10-05 ENCOUNTER — Encounter: Payer: Self-pay | Admitting: Adult Health

## 2022-10-05 VITALS — BP 165/85 | HR 55 | Ht 69.0 in | Wt 251.0 lb

## 2022-10-05 DIAGNOSIS — G3184 Mild cognitive impairment, so stated: Secondary | ICD-10-CM

## 2022-10-05 DIAGNOSIS — E538 Deficiency of other specified B group vitamins: Secondary | ICD-10-CM

## 2022-10-05 NOTE — Patient Instructions (Addendum)
Your Plan:  You will be referred for neurocognitive evaluation - you will be called to get this scheduled  Based on these results, we can further determine if you would be a good candidate for a twice monthly infusion called Leqembi   Please make sure you are taking B12 supplement 1022mcg daily - we will recheck your B12 levels today    Follow up after completion of neurocognitive evaluation       Thank you for coming to see [place] at [facility]. I hope we have been able to provide you high quality care today.  You may receive a patient satisfaction survey over the next few weeks. We would appreciate your feedback and comments so that we may continue to improve ourselves and the health of our patients.

## 2022-10-05 NOTE — Telephone Encounter (Signed)
Referral for Neuropsychology faxed to [facility]. Phone: [phone], Fax: [phone] 

## 2022-10-06 ENCOUNTER — Encounter: Payer: Self-pay | Admitting: Psychology

## 2022-10-06 LAB — VITAMIN B12: Vitamin B-12: 663 pg/mL (ref 232–1245)

## 2022-10-06 NOTE — Telephone Encounter (Signed)
Resent referral to Dr. Ilean Skill. Phone: [phone], Fax: [phone] Due to previous facility do not accept [employer].

## 2022-10-10 NOTE — Telephone Encounter (Signed)
Pt is asking for a call from out going referral coordinator re: where his referral was sent to

## 2022-10-10 NOTE — Telephone Encounter (Signed)
Pt wanted to know if scheduling with Dr. Sima Matas in April would be ok.  Informed pt nurse had instructed me to sent referral to Dr. Sima Matas.

## 2022-10-18 ENCOUNTER — Ambulatory Visit (HOSPITAL_COMMUNITY)
Admission: RE | Admit: 2022-10-18 | Discharge: 2022-10-18 | Disposition: A | Discharge status: Home / Self Care | Payer: 59 | Source: Ambulatory Visit | Attending: Physician Assistant | Admitting: Physician Assistant

## 2022-10-18 DIAGNOSIS — R109 Unspecified abdominal pain: Secondary | ICD-10-CM | POA: Diagnosis not present

## 2022-10-18 DIAGNOSIS — N200 Calculus of kidney: Secondary | ICD-10-CM | POA: Insufficient documentation

## 2022-10-19 ENCOUNTER — Encounter: Payer: Self-pay | Admitting: Urology

## 2022-10-19 ENCOUNTER — Ambulatory Visit: Payer: 59 | Admitting: Urology

## 2022-10-19 VITALS — BP 162/70 | HR 57

## 2022-10-19 DIAGNOSIS — R109 Unspecified abdominal pain: Secondary | ICD-10-CM

## 2022-10-19 DIAGNOSIS — R351 Nocturia: Secondary | ICD-10-CM | POA: Diagnosis not present

## 2022-10-19 DIAGNOSIS — N200 Calculus of kidney: Secondary | ICD-10-CM

## 2022-10-19 LAB — URINALYSIS, ROUTINE W REFLEX MICROSCOPIC
Bilirubin, UA: NEGATIVE
Glucose, UA: NEGATIVE
Ketones, UA: NEGATIVE
Leukocytes,UA: NEGATIVE
Nitrite, UA: NEGATIVE
Protein,UA: NEGATIVE
RBC, UA: NEGATIVE
Specific Gravity, UA: 1.025 (ref 1.005–1.030)
Urobilinogen, Ur: 0.2 mg/dL (ref 0.2–1.0)
pH, UA: 5.5 (ref 5.0–7.5)

## 2022-10-19 MED ORDER — CYCLOBENZAPRINE HCL 5 MG PO TABS: 5.0000 mg | ORAL_TABLET | Freq: Two times a day (BID) | ORAL | 1 refills | Status: DC | PRN

## 2022-10-19 NOTE — Progress Notes (Signed)
10/19/2022 1:46 PM   Jake Michaelis 04/07/1956 [id number]  Referring provider: Lianne Moris, PA-C [address] [place],  [place] [address]  Followup nephrolithiasis   HPI: Mr Groh is a 17JY here for followup for right flank pain and nephrolithiasis. No stone events since last visit. Renal US from yesterday shows no calculi and no hydronephrosis. IPSS 5 QOL 1 on flomax 0.4mg  daily. Nocturia 0-1x. Urine stream strong.    PMH: Past Medical History:  Diagnosis Date   Anxiety    Depression    Hx of blood clots    left leg years ago   Hypertension    Sleep apnea     Surgical History: Past Surgical History:  Procedure Laterality Date   EXTRACORPOREAL SHOCK WAVE LITHOTRIPSY Left 03/15/2022   Procedure: EXTRACORPOREAL SHOCK WAVE LITHOTRIPSY (ESWL);  Surgeon: Milderd Meager., MD;  Location: [facility];  Service: Urology;  Laterality: Left;   EXTRACORPOREAL SHOCK WAVE LITHOTRIPSY Right 04/05/2022   Procedure: EXTRACORPOREAL SHOCK WAVE LITHOTRIPSY (ESWL);  Surgeon: Malen Gauze, MD;  Location: [facility];  Service: Urology;  Laterality: Right;   EYE SURGERY     cateract surgery 20 yrs ago   SKIN CANCER EXCISION      Home Medications:  Allergies as of 10/19/2022       Reactions   No Known Allergies         Medication List        Accurate as of October 19, 2022  1:46 PM. If you have any questions, ask your nurse or doctor.          diclofenac Sodium 1 % Gel Commonly known as: VOLTAREN SMARTSIG:1-4 Gram(s) Topical Twice Daily   donepezil 5 MG tablet Commonly known as: ARICEPT Take 5 mg by mouth at bedtime.   HYDROcodone-acetaminophen 5-325 MG tablet Commonly known as: NORCO/VICODIN Take 1 tablet by mouth every 4 (four) hours as needed for moderate pain.   lisinopril 20 MG tablet Commonly known as: ZESTRIL Take 1 tablet by mouth daily.   methocarbamol 500 MG tablet Commonly known as: ROBAXIN TAKE 1 TABLET (500 MG TOTAL) BY MOUTH AT BEDTIME. TAKE AS NEEDED  FOR FLANK PAIN   ondansetron 8 MG tablet Commonly known as: ZOFRAN Take 1 tablet (8 mg total) by mouth 3 (three) times daily.   oxybutynin 5 MG tablet Commonly known as: DITROPAN Take 1 tablet (5 mg total) by mouth every 8 (eight) hours as needed for bladder spasms.   sertraline 100 MG tablet Commonly known as: ZOLOFT Take 1 tablet (100 mg total) by mouth daily.   simvastatin 20 MG tablet Commonly known as: ZOCOR Take 20 mg by mouth at bedtime.   sulfamethoxazole-trimethoprim 800-160 MG tablet Commonly known as: BACTRIM DS Take 1 tablet by mouth 2 (two) times daily.   tamsulosin 0.4 MG Caps capsule Commonly known as: FLOMAX Take 1 capsule (0.4 mg total) by mouth daily.   Xarelto 20 MG Tabs tablet Generic drug: rivaroxaban Take 20 mg by mouth daily.   zolpidem 10 MG tablet Commonly known as: AMBIEN Take 1 tablet (10 mg total) by mouth at bedtime as needed for sleep.        Allergies:  Allergies  Allergen Reactions   No Known Allergies     Family History: Family History  Problem Relation Age of Onset   Alcohol abuse Maternal Uncle    Post-traumatic stress disorder Son     Social History:  reports that he has never smoked. He has  never used smokeless tobacco. He reports current alcohol use of about 2.0 standard drinks of alcohol per week. He reports that he does not use drugs.  ROS: All other review of systems were reviewed and are negative except what is noted above in HPI  Physical Exam: BP (!) 162/70   Pulse (!) 57   Constitutional:  Alert and oriented, No acute distress. HEENT: [place] AT, moist mucus membranes.  Trachea midline, no masses. Cardiovascular: No clubbing, cyanosis, or edema. Respiratory: Normal respiratory effort, no increased work of breathing. GI: Abdomen is soft, nontender, nondistended, no abdominal masses GU: No CVA tenderness.  Lymph: No cervical or inguinal lymphadenopathy. Skin: No rashes, bruises or suspicious lesions. Neurologic:  Grossly intact, no focal deficits, moving all 4 extremities. Psychiatric: Normal mood and affect.  Laboratory Data: Lab Results  Component Value Date   WBC 4.5 08/22/2019   HGB 13.5 08/22/2019   HCT 41.4 08/22/2019   MCV 97.0 08/22/2019   PLT 133 (L) 08/22/2019    Lab Results  Component Value Date   CREATININE 1.18 05/12/2022    No results found for: "PSA"  No results found for: "TESTOSTERONE"  Lab Results  Component Value Date   HGBA1C 5.3 08/22/2019    Urinalysis    Component Value Date/Time   APPEARANCEUR Clear 06/30/2022 1026   GLUCOSEU Negative 06/30/2022 1026   BILIRUBINUR Negative 06/30/2022 1026   PROTEINUR Negative 06/30/2022 1026   NITRITE Negative 06/30/2022 1026   LEUKOCYTESUR Negative 06/30/2022 1026    Lab Results  Component Value Date   LABMICR Comment 06/30/2022   WBCUA 0-5 06/01/2022   LABEPIT None seen 06/01/2022   MUCUS Many (A) 04/20/2022   BACTERIA None seen 06/01/2022    Pertinent Imaging: Renal US 10/18/2022: Images reviewed and discussed with the patient  Results for orders placed during the hospital encounter of 04/20/22  Abdomen 1 view (KUB)  Narrative CLINICAL DATA:  Status post ESWL on right.  EXAM: ABDOMEN - 1 VIEW  COMPARISON:  Abdominal x-ray 04/05/2022, 03/04/2022.  CT 03/09/2022.  FINDINGS: 5 mm calcific density again noted over the right lower renal pole in unchanged position. No evidence of ureteral stone. Previously noted distal left ureteral stone no longer visualized. Aortoiliac and visceral atherosclerotic vascular calcification. Stool noted throughout the colon. No bowel distention. Lumbar spine scoliosis concave left. Stable appearing small sclerotic density noted over the left ilium, possibly a bone island. Degenerative changes lumbar spine and both hips.  IMPRESSION: Stable appearing 5 mm stone again noted over the right lower renal pole. No evidence of ureteral stone. Previously noted distal  left ureteral stone no longer visualized.   Electronically Signed By: Marcello Moores  Register M.D. On: 04/21/2022 07:35  No results found for this or any previous visit.  No results found for this or any previous visit.  No results found for this or any previous visit.  Results for orders placed during the hospital encounter of 08/03/22  Ultrasound renal complete  Narrative CLINICAL DATA:  Right renal hematoma status post ESWL  EXAM: RENAL / URINARY TRACT ULTRASOUND COMPLETE  COMPARISON:  renal ultrasound 06/24/2022  FINDINGS: Right Kidney:  Renal measurements: 12.2 x 5.1 x 5.8 cm = volume: 189.3 mL. Echogenicity within normal limits. Upper pole anechoic cortical cyst measuring 3.1 cm. No hydronephrosis.  Left Kidney:  Renal measurements: 11.2 x 5.9 x 4.8 cm = volume: 166.9 mL. Echogenicity within normal limits. No mass or hydronephrosis visualized.  Bladder:  Not fully distended.  Postvoid residual of 18  mL.  Other:  None.  IMPRESSION: No acute ultrasound finding.   Electronically Signed By: Minerva Fester M.D. On: 08/05/2022 00:07  No valid procedures specified. No results found for this or any previous visit.  Results for orders placed in visit on 03/09/22  CT RENAL STONE STUDY  Narrative CLINICAL DATA:  Flank pain, history of nephrolithiasis, left lower quadrant pain for 1 week  EXAM: CT ABDOMEN AND PELVIS WITHOUT CONTRAST  TECHNIQUE: Multidetector CT imaging of the abdomen and pelvis was performed following the standard protocol without IV contrast.  RADIATION DOSE REDUCTION: This exam was performed according to the departmental dose-optimization program which includes automated exposure control, adjustment of the mA and/or kV according to patient size and/or use of iterative reconstruction technique.  COMPARISON:  None available  FINDINGS: Lower chest: Scattered areas of bibasilar atelectasis. Subcentimeter calcified granuloma in the  left lower lobe. Mild cardiac enlargement. No pericardial or pleural effusion.  Hepatobiliary: Limited without IV contrast. Round 4.4 cm hypodense lesion in the right liver posteriorly has relatively low Hounsfield measurements, image 17/2, but remains indeterminate by noncontrast imaging. This could be further evaluated with abdominal ultrasound to determine if the lesion is solid or cystic. There is an additional ill-defined subcapsular hypodense lesion more inferior in the right liver measuring 2.1 cm, image 27/2.  No biliary dilatation or obstruction pattern.  Cholelithiasis noted. Gallbladder nondistended. Common bile duct nondilated.  Pancreas: Unremarkable. No pancreatic ductal dilatation or surrounding inflammatory changes.  Spleen: Normal in size without focal abnormality.  Adrenals/Urinary Tract: Normal adrenal glands.  Kidneys demonstrate tiny punctate nonobstructing intrarenal calculi bilaterally. No acute hydronephrosis. Ureters are symmetric and decompressed.  However, the left distal ureter does demonstrate a nonobstructing 5 mm calculus, image 73/2 in the pelvis very close to the left UVJ.  Bladder unremarkable.  Stomach/Bowel: Negative for bowel obstruction, significant dilatation, ileus, or free air. Normal appendix demonstrated. Scattered left colon and sigmoid diverticulosis without acute inflammatory process.  No free fluid, fluid collection, hemorrhage, hematoma, abscess or ascites.  Vascular/Lymphatic: Limited without IV contrast. Aorta atherosclerotic. Negative for aneurysm. No retroperitoneal hemorrhage or hematoma.  No bulky adenopathy.  Nonspecific small nonenlarged mesenteric root lymph nodes with slight mesenteric haziness, image 34/2. This can be seen with mesenteric adenitis/panniculitis.  Reproductive: Prostate gland is mildly enlarged. Seminal vesicles are symmetric. Prostate calcifications noted.  Other: No abdominal wall hernia  or abnormality. No abdominopelvic ascites.  Musculoskeletal: Degenerative changes throughout the spine. Lower lumbar facet arthropathy. L5-S1 advanced degenerative disc disease noted. No acute osseous finding.  IMPRESSION: 5 mm nonobstructing left distal ureteral calculus close to the left UVJ.  Additional nonobstructing punctate intrarenal calculi bilaterally.  Right liver indeterminate hypodense lesions measuring up to 4.4 cm. Consider initial evaluation with nonemergent follow-up abdominal ultrasound to determine if solid or cystic.  Cholelithiasis  Aortic Atherosclerosis (ICD10-I70.0).   Electronically Signed By: Judie Petit.  Shick M.D. On: 03/09/2022 10:37   Assessment & Plan:    1. Right flank pain -likely musculoskeletal and we will trial flexeril BID 5mg  PRN - Urinalysis, Routine w reflex microscopic  2. Kidney stones -RTC 6 months with renal  3. Nocturia -continue flomax 0.4mg  daily   No follow-ups on file.  [place], MD  [facility] [place]

## 2022-10-19 NOTE — Patient Instructions (Signed)

## 2022-11-03 ENCOUNTER — Encounter: Payer: 59 | Attending: Psychology | Admitting: Psychology

## 2022-11-03 DIAGNOSIS — F320 Major depressive disorder, single episode, mild: Secondary | ICD-10-CM | POA: Diagnosis not present

## 2022-11-03 DIAGNOSIS — G4733 Obstructive sleep apnea (adult) (pediatric): Secondary | ICD-10-CM | POA: Insufficient documentation

## 2022-11-03 DIAGNOSIS — R413 Other amnesia: Secondary | ICD-10-CM | POA: Diagnosis not present

## 2022-11-03 DIAGNOSIS — R4189 Other symptoms and signs involving cognitive functions and awareness: Secondary | ICD-10-CM | POA: Diagnosis not present
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
## 2022-11-12 ENCOUNTER — Other Ambulatory Visit: Payer: Self-pay | Admitting: Physician Assistant

## 2022-11-29 ENCOUNTER — Encounter: Payer: 59 | Attending: Psychology

## 2022-11-29 DIAGNOSIS — R413 Other amnesia: Secondary | ICD-10-CM | POA: Insufficient documentation

## 2022-11-29 NOTE — Progress Notes (Unsigned)
Behavioral Observations The patient appeared well-groomed and appropriately dressed. His manners were polite and appropriate to the situation. His attitude toward testing was positive and he showed good effort. The patient expressed sadness regarding his declining abilities and memory.   Neuropsychology Note  Kyle Mccarthy completed 180 minutes of neuropsychological testing with technician, Marica Otter, BA, under the supervision of Arley Phenix, PsyD., Clinical Neuropsychologist. The patient did not appear overtly distressed by the testing session, per behavioral observation or via self-report to the technician. Rest breaks were offered.   Clinical Decision Making: In considering the patient's current level of functioning, level of presumed impairment, nature of symptoms, emotional and behavioral responses during clinical interview, level of literacy, and observed level of motivation/effort, a battery of tests was selected by Dr. Kieth Brightly during initial consultation on 11/03/2022. This was communicated to the technician. Communication between the neuropsychologist and technician was ongoing throughout the testing session and changes were made as deemed necessary based on patient performance on testing, technician observations and additional pertinent factors such as those listed above.  Tests Administered: Controlled Oral Word Association Test (COWAT) Wechsler Adult Intelligence Scale, 4th Edition (WAIS-IV) Wechsler Memory Scale, 4th Edition (WMS-IV); Older Adult Battery   Results:  COWAT FAS Total = 26 Z = -0.77 Animals Total = 18 Z = 0.37      WAIS-IV  Composite Score Summary  Scale Sum of Scaled Scores Composite Score Percentile Rank 95% Conf. Interval Qualitative Description  Verbal Comprehension 37 VCI 112 79 106-117 High Average  Perceptual Reasoning 23 PRI 86 18 80-93 Low Average  Working Memory 17 WMI 92 30 86-99 Average  Processing Speed 13 PSI 81 10 75-91  Low Average  Full Scale 90 FSIQ 93 32 89-97 Average  General Ability 60 GAI 100 50 95-105 Average       Verbal Comprehension Subtests Summary  Subtest Raw Score Scaled Score Percentile Rank Reference Group Scaled Score SEM  Similarities 29 12 75 12 1.04  Vocabulary 45 12 75 13 0.67  Information 21 13 84 15 0.73  (Comprehension) 27 12 75 12 1.08        Perceptual Reasoning Subtests Summary  Subtest Raw Score Scaled Score Percentile Rank Reference Group Scaled Score SEM  Block Design 28 9 37 6 1.08  Matrix Reasoning 9 7 16 4  0.90  Visual Puzzles 8 7 16 6  0.85  (Figure Weights) 11 9 37 7 0.95  (Picture Completion) 8 8 25 6  1.16        Working Raw Score Scaled Score Percentile Rank Reference Group Scaled Score SEM  Digit Span 22 8 25 7  0.79  Arithmetic 12 9 37 9 0.99  (Letter-Number Seq.) 14 7 16 6  1.04        Processing Speed Subtests Summary  Subtest Raw Score Scaled Score Percentile Rank Reference Group Scaled Score SEM  Symbol Search 16 6 9 4  1.31  Coding 37 7 16 4  0.99  (Cancellation) 17 4 2 3  1.34        WMS-IV  Index Score Summary  Index Sum of Scaled Scores Index Score Percentile Rank 95% Confidence Interval Qualitative Descriptor  Auditory Memory (AMI) 35 93 32 87-100 Average  Visual Memory (VMI) 10 73 4 69-79 Borderline  Immediate Memory (IMI) 22 83 13 78-90 Low Average  Delayed Memory (DMI) 23 85 16 79-94 Low Average       Primary Subtest Scaled Score Summary  Subtest Domain Raw Score Scaled  Score Percentile Rank  Logical Memory I AM 30 9 37  Logical Memory II AM 21 11 63  Verbal Paired Associates I AM 12 7 16   Verbal Paired Associates II AM 5 8 25   Visual Reproduction I VM 23 6 9   Visual Reproduction II VM 4 4 2   Symbol Span VWM 21 11 63       Auditory Memory Process Score Summary  Process Score Raw Score Scaled Score Percentile Rank Cumulative Percentage (Base Rate)  LM II Recognition 19 -  - 51-75%  VPA II Recognition 25 - - 10-16%        Visual Memory Process Score Summary  Process Score Raw Score Scaled Score Percentile Rank Cumulative Percentage (Base Rate)  VR II Recognition 4 - - 26-50%        ABILITY-MEMORY ANALYSIS  Ability Score:  VCI: 112 Date of Testing:  WAIS-IV; WMS-IV 2022/11/29  Predicted Difference Method   Index Predicted WMS-IV Index Score Actual WMS-IV Index Score Difference Critical Value  Significant Difference Y/N Base Rate  Auditory Memory 106 93 13 9.51 Y 15-20%  Visual Memory 105 73 32 8.07 Y 1%  Immediate Memory 107 83 24 10.30 Y 3%  Delayed Memory 106 85 21 11.51 Y 5-10%  Statistical significance (critical value) at the .01 level.        Feedback to Patient: Kyle Mccarthy will return on 05/09/2022 for an interactive feedback session with Dr. at which time his test performances, clinical impressions and treatment recommendations will be reviewed in detail. The patient understands he can contact our office should he require our assistance before this time.  180 minutes spent face-to-face with patient administering standardized tests, 30 minutes spent scoring 2022/12/01). [CPT Jake Michaelis, [id number]]  Full report to follow.

## 2022-12-05 ENCOUNTER — Other Ambulatory Visit: Payer: Self-pay | Admitting: Physician Assistant

## 2023-02-02 ENCOUNTER — Encounter: Payer: 59 | Attending: Psychology | Admitting: Psychology

## 2023-02-02 DIAGNOSIS — R413 Other amnesia: Secondary | ICD-10-CM | POA: Diagnosis present

## 2023-02-02 DIAGNOSIS — R4189 Other symptoms and signs involving cognitive functions and awareness: Secondary | ICD-10-CM | POA: Insufficient documentation

## 2023-02-02 DIAGNOSIS — F320 Major depressive disorder, single episode, mild: Secondary | ICD-10-CM | POA: Insufficient documentation

## 2023-02-02 DIAGNOSIS — G4733 Obstructive sleep apnea (adult) (pediatric): Secondary | ICD-10-CM | POA: Diagnosis present

## 2023-02-02 NOTE — Progress Notes (Signed)
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 of progressive degenerative process and the patient would not meet a diagnosis of major neurocognitive disorder or dementia.  The weaknesses found on objective neuropsychological assessment would be consistent with those most typically seen with microvascular ischemic changes and white matter regions which were in fact identified with the patient during his MRI.  I suspect that the acute episodes of cognitive disturbance are related to acute cerebrovascular changes potentially related to migrainous type activity.  These may represent aura phase and the patient does describe times of headache and photophobia after acute cognitive change occurs.  I will leave up this specific consideration to his treating neurologist.  In any event, there does not appear to be patterns consistent with any type of progressive dementia and I suspect that the baseline cognitive changes the patient is experiencing are likely related to microvascular ischemic changes and small vessel disease.  The patient does have some symptoms  that can be associated with Lewy body dementia including tremor and visual hallucination although his tremors appear to be only present during acute onset symptoms that resolved very quickly and not part of baseline symptoms and his visual hallucinations occur during extended periods of sleep disturbance and sleep disorder related to obstructive sleep apnea.  Visual hallucinations have stopped once he was compliant with CPAP use.  As far as treatment recommendations, it will be critical for the patient to continue to diligently use his CPAP machine.  The patient's clinical description of his symptoms and course appears to have significantly improved since he has become compliant with his CPAP machine.  Particularly issues of visual hallucination were likely related to untreated obstructive sleep apnea.  The patient does have a significant history of major depressive disorder that has been severe in the past with suicidal ideation and hospitalization.  The patient reports significant improvement in his mood state but he does continue to have significant psychosocial stressors particularly around family dynamics.  The patient appears to be responding well to psychotropic medication (sertraline) and I would strongly suggest that the patient continue to use this medicine.  I do not think the patient is continuing to take his Ambien that was prescribed previously and if he is continuing to take Ambien I would suggest we look at possibly cutting back with a goal of stopping this medication as it can have an impact on memory etc.  The patient's sleep disturbance does appear to be much better treated with his CPAP device.  The patient should continue with his psychiatric care and is continuing to be followed by Dr. Adele Schilder through [facility] behavioral [facility] and it appears that Dr. Adele Schilder has done a very good job managing his psychiatric symptoms.  I would encourage this to continue.  The patient may also consider therapeutic  counseling around psychosocial stressors although I suspect family therapy is potentially out of the question at least with regard to his to sons that live out of the area.  I will sit down with the patient and reviewed the results of the current neuropsychological evaluation with more in-depth recommendations regarding the patient's specific interventions.  As always, close monitoring of good sleep hygiene and proper sleep patterns, good nutrition and sustained physical activity should always be maintained.  Diagnosis:    Memory loss  Cognitive impairment  Obstructive sleep apnea syndrome  Current mild episode of major depressive disorder, unspecified whether recurrent ([place])   _____________________ Ilean Skill, Psy.D. Clinical Neuropsychologist

## 2023-04-11 ENCOUNTER — Encounter: Payer: Self-pay | Admitting: Neurology

## 2023-04-11 ENCOUNTER — Ambulatory Visit: Payer: 59 | Admitting: Neurology

## 2023-04-11 VITALS — BP 156/83 | HR 54 | Ht 68.0 in | Wt 257.0 lb

## 2023-04-11 DIAGNOSIS — G4733 Obstructive sleep apnea (adult) (pediatric): Secondary | ICD-10-CM

## 2023-04-11 DIAGNOSIS — R4189 Other symptoms and signs involving cognitive functions and awareness: Secondary | ICD-10-CM

## 2023-04-11 DIAGNOSIS — R519 Headache, unspecified: Secondary | ICD-10-CM | POA: Diagnosis not present
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
## 2023-04-12 LAB — ANA W/REFLEX IF POSITIVE
Anti JO-1: <0.2 AI (ref 0.0–0.9)
Anti Nuclear Antibody (ANA): POSITIVE — AB
Centromere Ab Screen: <0.2 AI (ref 0.0–0.9)
Chromatin Ab SerPl-aCnc: <0.2 AI (ref 0.0–0.9)
ENA RNP Ab: 4.1 AI — ABNORMAL HIGH (ref 0.0–0.9)
ENA SM Ab Ser-aCnc: <0.2 AI (ref 0.0–0.9)
ENA SSA (RO) Ab: <0.2 AI (ref 0.0–0.9)
ENA SSB (LA) Ab: 0.8 AI (ref 0.0–0.9)
Scleroderma (Scl-70) (ENA) Antibody, IgG: <0.2 AI (ref 0.0–0.9)
dsDNA Ab: <1 IU/mL (ref 0–9)

## 2023-04-12 LAB — C-REACTIVE PROTEIN: CRP: 9 mg/L (ref 0–10)

## 2023-04-12 LAB — CBC WITH DIFFERENTIAL
Basophils Absolute: 0 10*3/uL (ref 0.0–0.2)
Basos: 0 %
EOS (ABSOLUTE): 0.1 10*3/uL (ref 0.0–0.4)
Eos: 2 %
Hematocrit: 39.3 % (ref 37.5–51.0)
Hemoglobin: 13 g/dL (ref 13.0–17.7)
Immature Grans (Abs): 0 10*3/uL (ref 0.0–0.1)
Immature Granulocytes: 0 %
Lymphocytes Absolute: 1.6 10*3/uL (ref 0.7–3.1)
Lymphs: 28 %
MCH: 31.2 pg (ref 26.6–33.0)
MCHC: 33.1 g/dL (ref 31.5–35.7)
MCV: 94 fL (ref 79–97)
Monocytes Absolute: 0.4 10*3/uL (ref 0.1–0.9)
Monocytes: 8 %
Neutrophils Absolute: 3.5 10*3/uL (ref 1.4–7.0)
Neutrophils: 62 %
RBC: 4.17 x10E6/uL (ref 4.14–5.80)
RDW: 12.2 % (ref 11.6–15.4)
WBC: 5.6 10*3/uL (ref 3.4–10.8)

## 2023-04-12 LAB — COMPREHENSIVE METABOLIC PANEL
ALT: 8 IU/L (ref 0–44)
AST: 16 IU/L (ref 0–40)
Albumin/Globulin Ratio: 1.7 (ref 1.2–2.2)
Albumin: 3.9 g/dL (ref 3.9–4.9)
Alkaline Phosphatase: 98 IU/L (ref 44–121)
BUN/Creatinine Ratio: 12 (ref 10–24)
BUN: 14 mg/dL (ref 8–27)
Bilirubin Total: 0.5 mg/dL (ref 0.0–1.2)
CO2: 23 mmol/L (ref 20–29)
Calcium: 9.3 mg/dL (ref 8.6–10.2)
Chloride: 106 mmol/L (ref 96–106)
Creatinine, Ser: 1.13 mg/dL (ref 0.76–1.27)
Globulin, Total: 2.3 g/dL (ref 1.5–4.5)
Glucose: 89 mg/dL (ref 70–99)
Potassium: 4 mmol/L (ref 3.5–5.2)
Sodium: 143 mmol/L (ref 134–144)
Total Protein: 6.2 g/dL (ref 6.0–8.5)
eGFR: 71 mL/min/{1.73_m2} (ref >59)

## 2023-04-12 LAB — SEDIMENTATION RATE: Sed Rate: 22 mm/hr (ref 0–30)

## 2023-04-12 LAB — TSH: TSH: 2.09 u[IU]/mL (ref 0.450–4.500)

## 2023-04-12 LAB — HGB A1C W/O EAG: Hgb A1c MFr Bld: 5.5 % (ref 4.8–5.6)

## 2023-04-13 ENCOUNTER — Ambulatory Visit: Payer: 59 | Admitting: Psychology

## 2023-04-19 ENCOUNTER — Ambulatory Visit (HOSPITAL_COMMUNITY)
Admission: RE | Admit: 2023-04-19 | Discharge: 2023-04-19 | Disposition: A | Discharge status: Home / Self Care | Payer: 59 | Source: Ambulatory Visit | Attending: Urology | Admitting: Urology

## 2023-04-19 DIAGNOSIS — N200 Calculus of kidney: Secondary | ICD-10-CM | POA: Diagnosis present

## 2023-04-21 ENCOUNTER — Ambulatory Visit: Payer: 59 | Admitting: Urology

## 2023-05-03 ENCOUNTER — Ambulatory Visit: Payer: 59 | Admitting: Psychology

## 2023-05-10 ENCOUNTER — Ambulatory Visit: Payer: 59 | Admitting: Primary Care

## 2023-05-10 ENCOUNTER — Encounter (INDEPENDENT_AMBULATORY_CARE_PROVIDER_SITE_OTHER): Payer: Self-pay | Admitting: Internal Medicine

## 2023-05-10 ENCOUNTER — Encounter: Payer: Self-pay | Admitting: Family Medicine

## 2023-05-10 ENCOUNTER — Encounter: Payer: 59 | Attending: Psychology | Admitting: Psychology

## 2023-05-10 ENCOUNTER — Encounter: Payer: Self-pay | Admitting: Primary Care

## 2023-05-10 ENCOUNTER — Ambulatory Visit (INDEPENDENT_AMBULATORY_CARE_PROVIDER_SITE_OTHER): Payer: 59 | Admitting: Internal Medicine

## 2023-05-10 VITALS — BP 118/64 | HR 61 | Temp 99.1°F | Ht 69.0 in | Wt 265.8 lb

## 2023-05-10 VITALS — BP 159/72 | HR 49 | Temp 98.7°F | Ht 69.0 in | Wt 258.0 lb

## 2023-05-10 DIAGNOSIS — G4733 Obstructive sleep apnea (adult) (pediatric): Secondary | ICD-10-CM | POA: Insufficient documentation

## 2023-05-10 DIAGNOSIS — Z6838 Body mass index (BMI) 38.0-38.9, adult: Secondary | ICD-10-CM

## 2023-05-10 DIAGNOSIS — F320 Major depressive disorder, single episode, mild: Secondary | ICD-10-CM

## 2023-05-10 DIAGNOSIS — R413 Other amnesia: Secondary | ICD-10-CM | POA: Insufficient documentation

## 2023-05-10 DIAGNOSIS — R4189 Other symptoms and signs involving cognitive functions and awareness: Secondary | ICD-10-CM | POA: Insufficient documentation

## 2023-05-10 DIAGNOSIS — I10 Essential (primary) hypertension: Secondary | ICD-10-CM | POA: Diagnosis not present

## 2023-05-10 NOTE — Assessment & Plan Note (Signed)
On CPAP with reported good compliance. Continue PAP therapy. Losing 15% or more of body weight may improve AHI.    

## 2023-05-10 NOTE — Progress Notes (Addendum)
@Patient  ID: Kyle Mccarthy, male    DOB: 1956/02/13, 67 y.o.   MRN: [id number]  Chief Complaint  Patient presents with   Consult    Increased fatigue during day.  Sleep quality poor.  Not sure if he snores.    Referring provider: Juliette Alcide, MD  HPI: 67 year old male, never smoked. PMH significant for hypertension, obstructive sleep apnea, insomnia, cognitive impairment, depression, obesity, DVT on Xarelto.  05/10/2023 Patient presents today for sleep consult. He has been seeing neurology for memory loss, last seen in April. He has been having daily headaches. Hx sleep apnea, he had a home sleep study with his primary care in 2023. Currently on CPAP. Feels CPAP is not working properly, settings have not been adjusted. He continues to have fatigue symptoms. He lives alone and is unsure if he snores. He takes ambien nightly for insomnia. He gets on average 6.5 or 7 hours of sleep a night. Patient used to work as a Naval architect, he is now on short term disability. He likes to stay busy. He went from working 60 hours a week to not work. His primary took him out of work for 1 year, may look into retiring after that. DME company is [employer] out of [employer].   Patient had a sleep study on 02/04/2021 that showed severe obstructive sleep apnea, AHI 60.5 an hour.  Patient spent 219 minutes with an oxygen level less than 90%.  He was started on auto CPAP.  Sleep questionnaire Symptoms-  Duration and quality of his sleep fluctuates  Prior sleep study- February 17th 2022, results are not in chart (requesting from [facility] in [place] -Dr. Rosana Hoes )  Bedtime- 10-11pm Time to fall asleep- 5 mins  Nocturnal awakenings- twice  Out of bed/start of day- 7am  Weight changes- 25 lbs  Do you operate heavy machinery- yes  Do you currently wear CPAP- yes  Do you current wear oxygen- no [place]- 17  Allergies  Allergen Reactions   No Known Allergies     Immunization  History  Administered Date(s) Administered   PFIZER(Purple Top)SARS-COV-2 Vaccination 02/22/2020, 03/14/2020, 10/12/2020   Pneumococcal Polysaccharide-23 08/22/2019    Past Medical History:  Diagnosis Date   Anxiety    Depression    Hx of blood clots    left leg years ago   Hypertension    Sleep apnea     Tobacco History: Social History   Tobacco Use  Smoking Status Never  Smokeless Tobacco Never   Counseling given: Not Answered   Outpatient Medications Prior to Visit  Medication Sig Dispense Refill   atorvastatin (LIPITOR) 80 MG tablet Take 80 mg by mouth daily.     cyclobenzaprine (FLEXERIL) 5 MG tablet Take 1 tablet (5 mg total) by mouth 2 (two) times daily as needed for muscle spasms. 30 tablet 1   diclofenac Sodium (VOLTAREN) 1 % GEL SMARTSIG:1-4 Gram(s) Topical Twice Daily     donepezil (ARICEPT) 5 MG tablet Take 5 mg by mouth at bedtime.     ibuprofen (ADVIL) 800 MG tablet Take 800 mg by mouth every 8 (eight) hours as needed.     lisinopril (ZESTRIL) 20 MG tablet Take 1 tablet by mouth daily.     meloxicam (MOBIC) 15 MG tablet Take 15 mg by mouth daily as needed.     methocarbamol (ROBAXIN) 500 MG tablet TAKE 1 TABLET (500 MG TOTAL) BY MOUTH AT BEDTIME. TAKE AS NEEDED FOR FLANK PAIN 20 tablet 0  oxybutynin (DITROPAN) 5 MG tablet Take 1 tablet (5 mg total) by mouth every 8 (eight) hours as needed for bladder spasms. 30 tablet 0   propranolol ER (INDERAL LA) 80 MG 24 hr capsule Take 80 mg by mouth daily.     QUEtiapine (SEROQUEL) 100 MG tablet Take 100 mg by mouth at bedtime.     sertraline (ZOLOFT) 100 MG tablet Take 1 tablet (100 mg total) by mouth daily. 90 tablet 1   simvastatin (ZOCOR) 20 MG tablet Take 20 mg by mouth at bedtime.     tamsulosin (FLOMAX) 0.4 MG CAPS capsule Take 1 capsule (0.4 mg total) by mouth daily. 30 capsule 0   XARELTO 20 MG TABS tablet Take 20 mg by mouth daily.     zolpidem (AMBIEN) 10 MG tablet Take 1 tablet (10 mg total) by mouth at  bedtime as needed for sleep. 30 tablet 0   No facility-administered medications prior to visit.   Review of Systems  Review of Systems  Constitutional:  Positive for fatigue.  Respiratory: Negative.     Physical Exam  BP 118/64 (BP Location: Left Arm, Patient Position: Sitting, Cuff Size: Large)   Pulse 61   Temp 99.1 F (37.3 C) (Oral)   Ht 5\' 9"  (1.753 m)   Wt 265 lb 12.8 oz (120.6 kg)   SpO2 94%   BMI 39.25 kg/m  Physical Exam Constitutional:      General: He is not in acute distress.    Appearance: Normal appearance. He is not ill-appearing.  HENT:     Head: Normocephalic and atraumatic.     Mouth/Throat:     Mouth: Mucous membranes are moist.     Pharynx: Oropharynx is clear.     Comments: Mallampati class I Cardiovascular:     Rate and Rhythm: Normal rate and regular rhythm.  Pulmonary:     Effort: Pulmonary effort is normal.     Breath sounds: Normal breath sounds.  Skin:    General: Skin is warm and dry.  Neurological:     General: No focal deficit present.     Mental Status: He is alert and oriented to person, place, and time. Mental status is at baseline.  Psychiatric:        Mood and Affect: Mood normal.        Behavior: Behavior normal.        Thought Content: Thought content normal.        Judgment: Judgment normal.      Lab Results:  CBC    Component Value Date/Time   WBC 5.6 04/11/2023 1534   WBC 4.5 08/22/2019 0629   RBC 4.17 04/11/2023 1534   RBC 4.27 08/22/2019 0629   HGB 13.0 04/11/2023 1534   HCT 39.3 04/11/2023 1534   PLT 133 (L) 08/22/2019 0629   MCV 94 04/11/2023 1534   MCH 31.2 04/11/2023 1534   MCH 31.6 08/22/2019 0629   MCHC 33.1 04/11/2023 1534   MCHC 32.6 08/22/2019 0629   RDW 12.2 04/11/2023 1534   LYMPHSABS 1.6 04/11/2023 1534   EOSABS 0.1 04/11/2023 1534   BASOSABS 0.0 04/11/2023 1534    BMET    Component Value Date/Time   NA 143 04/11/2023 1534   K 4.0 04/11/2023 1534   CL 106 04/11/2023 1534   CO2 23  04/11/2023 1534   GLUCOSE 89 04/11/2023 1534   GLUCOSE 89 08/22/2019 0629   BUN 14 04/11/2023 1534   CREATININE 1.13 04/11/2023 1534   CALCIUM 9.3  04/11/2023 1534   GFRNONAA >60 08/22/2019 0629   GFRAA >60 08/22/2019 0629    BNP No results found for: "BNP"  ProBNP No results found for: "PROBNP"  Imaging: Ultrasound renal complete  Result Date: 04/21/2023 CLINICAL DATA:  Nephrolithiasis EXAM: RENAL / URINARY TRACT ULTRASOUND COMPLETE COMPARISON:  Renal ultrasound 10/18/2022 FINDINGS: Right Kidney: Renal measurements: 11.0 x 5.1 x 6.0 cm = volume: 176.8 mL. The renal cortical thickness and echogenicity. No hydronephrosis. There is a simple appearing 3.5 cm cyst superior pole right kidney. Opening follow-up needed. Left Kidney: Renal measurements: 12.5 x 6.3 x 5.6 cm = volume: 229.7 mL. Normal renal cortical thickness and echogenicity. No hydronephrosis. There is a 15 mm probable stone midpole left kidney. Bladder: Appears normal for degree of bladder distention. Other: None. IMPRESSION: 1. No hydronephrosis. 2. Left nephrolithiasis. Electronically Signed   By: Annia Belt M.D.   On: 04/21/2023 06:42     Assessment & Plan:   Obstructive sleep apnea - History of severe obstructive sleep apnea. HST 02/04/2021 >> AHI 60.5 an hour.  He is on auto CPAP.  Continues to have issues with fatigue and sleep disruption.  No data on SD card, patient is not enrolled in [place]. DME company is [employer] out of [employer].  Recommend getting in-lab CPAP titration study to assess pressure settings.  Encourage patient continue to wear CPAP nightly for 4 to 6 hours or longer. May want to consider medication for hypersomnia at follow-up if apneas are well controlled and symptoms persist. Follow-up in 6 weeks or sooner if needed.    Glenford Bayley, NP 05/10/2023

## 2023-05-10 NOTE — Assessment & Plan Note (Addendum)
-   History of severe obstructive sleep apnea. HST 02/04/2021 >> AHI 60.5 an hour.  He is on auto CPAP.  Continues to have issues with fatigue and sleep disruption.  No data on SD card, patient is not enrolled in [place]. DME company is [employer] out of [employer].  Recommend getting in-lab CPAP titration study to assess pressure settings.  Encourage patient continue to wear CPAP nightly for 4 to 6 hours or longer. May want to consider medication for hypersomnia at follow-up if apneas are well controlled and symptoms persist. Follow-up in 6 weeks or sooner if needed.

## 2023-05-10 NOTE — Assessment & Plan Note (Signed)
He is currently undergoing evaluation by a neurologist.  He has problems with sleep, OSA and is on several CNS depressing drugs and anticholinergics.  He also has uncontrolled hypertension all of these may contribute to cognitive problems.  He may also have underlying depression.  He is working closely with a psychiatrist and primary care.  I reviewed with him the intensity of our program and considering distance he has to travel for program may not be suitable for him but regardless we can make modifications to help him.  He will think about what was discussed and decide if he wants to enroll.

## 2023-05-10 NOTE — Assessment & Plan Note (Signed)
Uncontrolled.  I reviewed with him the risk associated with uncontrolled blood pressure including the risk of vascular dementia.  He is currently on propranolol which may cause weight gain.  I advised that he follow-up with his primary care monitor blood pressures at home so they could intensify medical therapy.  I reviewed most recent renal parameters and they are within normal limits.  I recommend caution with the use of propranolol and cognitive enhancers as this may cause bradycardia arrhythmias.

## 2023-05-10 NOTE — Progress Notes (Signed)
Office: [phone]  /  Fax: [phone]   Initial Visit  Kyle Mccarthy was seen in clinic today to evaluate for obesity. He is interested in losing weight to improve overall health and reduce the risk of weight related complications. He presents today to review program treatment options, initial physical assessment, and evaluation.  He is by himself, has memory and sleep issues.  He lives an hour away in [place] and has a son who has declining health due to refractory C. difficile.  This is a high source of stress for patient.  He was referred by: Specialist - neurologist  When asked what else they would like to accomplish? 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 Started to gain weight gradually.  When asked how has your weight affected you? He states: Contributed to medical problems, Having fatigue, Having poor endurance, and Problems with eating patterns  Some associated conditions: Beta-blockers, Psychotropic medications, and Anticholinergics  Contributing factors: Family history, Disruption of circadian rhythm, Nutritional, Medications, Stress, Reduced physical activity, Eating patterns, and Other: age  Weight promoting medications identified: Beta-blockers, Psychotropic medications, and Anticholinergics  Current nutrition plan: None  Current level of physical activity: None  Current or previous pharmacotherapy: None  Response to medication: Never tried medications   Past medical history includes:   Past Medical History:  Diagnosis Date   Anxiety    Depression    Hx of blood clots    left leg years ago   Hypertension    Sleep apnea      Objective:   BP (!) 159/72   Pulse (!) 49   Temp 98.7 F (37.1 C)   Ht 5\' 9"  (1.753 m)    Wt 258 lb (117 kg)   SpO2 97%   BMI 38.10 kg/m  He was weighed on the bioimpedance scale: Body mass index is 38.1 kg/m.    General:  Alert, oriented and cooperative. Patient is in no acute distress.  Respiratory: Normal respiratory effort, no problems with respiration noted   Gait: able to ambulate independently  Mental Status: Normal mood and affect. Normal behavior. Normal judgment and thought content.   DIAGNOSTIC DATA REVIEWED:  BMET    Component Value Date/Time   NA 143 04/11/2023 1534   K 4.0 04/11/2023 1534   CL 106 04/11/2023 1534   CO2 23 04/11/2023 1534   GLUCOSE 89 04/11/2023 1534   GLUCOSE 89 08/22/2019 0629   BUN 14 04/11/2023 1534   CREATININE 1.13 04/11/2023 1534   CALCIUM 9.3 04/11/2023 1534   GFRNONAA >60 08/22/2019 0629   GFRAA >60 08/22/2019 0629   Lab Results  Component Value Date   HGBA1C 5.5 04/11/2023   HGBA1C 5.3 08/22/2019   No results found for: "INSULIN" CBC    Component Value Date/Time   WBC 5.6 04/11/2023 1534   WBC 4.5 08/22/2019 0629   RBC 4.17 04/11/2023 1534   RBC 4.27 08/22/2019 0629   HGB 13.0 04/11/2023 1534   HCT 39.3 04/11/2023 1534   PLT 133 (L) 08/22/2019 0629   MCV 94 04/11/2023 1534   MCH 31.2 04/11/2023 1534   MCH 31.6 08/22/2019 0629   MCHC 33.1 04/11/2023 1534   MCHC 32.6 08/22/2019 0629   RDW 12.2 04/11/2023 1534   Iron/TIBC/Ferritin/ %Sat No results found for: "IRON", "TIBC", "FERRITIN", "IRONPCTSAT" Lipid  Panel     Component Value Date/Time   CHOL 136 08/22/2019 0629   TRIG 98 08/22/2019 0629   HDL 48 08/22/2019 0629   CHOLHDL 2.8 08/22/2019 0629   VLDL 20 08/22/2019 0629   LDLCALC 68 08/22/2019 0629   Hepatic Function Panel     Component Value Date/Time   PROT 6.2 04/11/2023 1534   ALBUMIN 3.9 04/11/2023 1534   AST 16 04/11/2023 1534   ALT 8 04/11/2023 1534   ALKPHOS 98 04/11/2023 1534   BILITOT 0.5 04/11/2023 1534      Component Value Date/Time   TSH 2.090 04/11/2023 1534     Assessment  and Plan:   Essential (primary) hypertension Assessment & Plan: Uncontrolled.  I reviewed with him the risk associated with uncontrolled blood pressure including the risk of vascular dementia.  He is currently on propranolol which may cause weight gain.  I advised that he follow-up with his primary care monitor blood pressures at home so they could intensify medical therapy.  I reviewed most recent renal parameters and they are within normal limits.  I recommend caution with the use of propranolol and cognitive enhancers as this may cause bradycardia arrhythmias.   Obstructive sleep apnea Assessment & Plan: On CPAP with reported good compliance. Continue PAP therapy. Losing 15% or more of body weight may improve AHI.      Class 2 severe obesity with serious comorbidity and body mass index (BMI) of 38.0 to 38.9 in adult, unspecified obesity type [facility]) Assessment & Plan: We reviewed weight, biometrics, associated medical conditions and contributing factors with patient. She would benefit from weight loss therapy via a modified calorie, low-carb, high-protein nutritional plan tailored to their REE (resting energy expenditure) which will be determined by indirect calorimetry.  We will also assess for cardiometabolic risk and nutritional derangements via fasting serologies at her next appointment.   Cognitive impairment Assessment & Plan: He is currently undergoing evaluation by a neurologist.  He has problems with sleep, OSA and is on several CNS depressing drugs and anticholinergics.  He also has uncontrolled hypertension all of these may contribute to cognitive problems.  He may also have underlying depression.  He is working closely with a psychiatrist and primary care.  I reviewed with him the intensity of our program and considering distance he has to travel for program may not be suitable for him but regardless we can make modifications to help him.  He will think about what was discussed and  decide if he wants to enroll.         Obesity Treatment / Action Plan:  Patient will work on garnering support from family and friends to begin weight loss journey. Will work on eliminating or reducing the presence of highly palatable, calorie dense foods in the home. Will complete provided nutritional and psychosocial assessment questionnaire before the next appointment. Will be scheduled for indirect calorimetry to determine resting energy expenditure in a fasting state.  This will allow [place] to create a reduced calorie, high-protein meal plan to promote loss of fat mass while preserving muscle mass. Counseled on the health benefits of losing 5%-15% of total body weight. Was counseled on nutritional approaches to weight loss and benefits of reducing processed foods and consuming plant-based foods and high quality protein as part of nutritional weight management. Was counseled on pharmacotherapy and role as an adjunct in weight management.   Obesity Education Performed Today:  He was weighed on the bioimpedance scale and results were discussed and  documented in the synopsis.  We discussed obesity as a disease and the importance of a more detailed evaluation of all the factors contributing to the disease.  We discussed the importance of long term lifestyle changes which include nutrition, exercise and behavioral modifications as well as the importance of customizing this to his specific health and social needs.  We discussed the benefits of reaching a healthier weight to alleviate the symptoms of existing conditions and reduce the risks of the biomechanical, metabolic and psychological effects of obesity.  Jake Michaelis appears to be in the action stage of change and states they are ready to start intensive lifestyle modifications and behavioral modifications.  30 minutes was spent today on this visit including the above counseling, pre-visit chart review, and post-visit  documentation.  Reviewed by clinician on day of visit: allergies, medications, problem list, medical history, surgical history, family history, social history, and previous encounter notes pertinent to obesity diagnosis.   Worthy Rancher, MD

## 2023-05-10 NOTE — Assessment & Plan Note (Signed)
We reviewed weight, biometrics, associated medical conditions and contributing factors with patient. She would benefit from weight loss therapy via a modified calorie, low-carb, high-protein nutritional plan tailored to their REE (resting energy expenditure) which will be determined by indirect calorimetry.  We will also assess for cardiometabolic risk and nutritional derangements via fasting serologies at her next appointment. 

## 2023-05-10 NOTE — Patient Instructions (Addendum)
  Please see if you can get a copy of your sleep study from 2022 and the name of your medical supply store in [place]   We will get a CPAP titration study to re-evaluate pressure settings  If sleep apnea is well controlled and hypersomnia persists we may be able to speak about medications to help increase alert fullness   Recommendations: Continue to wear CPAP nightly 4-6 hours or longer  Focus on side sleeping position or elevate head with wedge pillow 30 degrees Work on weight loss efforts if able  Do not drive if experiencing excessive daytime sleepiness of fatigue   Orders: CPAP titration study   Request sleep study from   [facility] [place]  DR. Rosana Hoes   Contact [facility] in [place] to inquire about getting patient added to [facility] for compliance report    Follow-up: 6 weeks with Waynetta Sandy NP

## 2023-05-11 ENCOUNTER — Encounter: Payer: Self-pay | Admitting: Psychology
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
## 2023-05-11 NOTE — Progress Notes (Signed)
Reviewed and agree with assessment/plan.   Eddis Pingleton, MD [facility] 05/11/2023, 7:05 AM Pager:  [phone]  

## 2023-05-12 ENCOUNTER — Encounter: Payer: Self-pay | Admitting: Family Medicine

## 2023-05-17 ENCOUNTER — Ambulatory Visit (INDEPENDENT_AMBULATORY_CARE_PROVIDER_SITE_OTHER): Payer: 59 | Admitting: Urology

## 2023-05-17 ENCOUNTER — Encounter: Payer: Self-pay | Admitting: Urology

## 2023-05-17 ENCOUNTER — Ambulatory Visit (HOSPITAL_COMMUNITY)
Admission: RE | Admit: 2023-05-17 | Discharge: 2023-05-17 | Disposition: A | Discharge status: Home / Self Care | Payer: 59 | Source: Ambulatory Visit | Attending: Urology | Admitting: Urology

## 2023-05-17 VITALS — BP 149/69 | HR 69

## 2023-05-17 DIAGNOSIS — R351 Nocturia: Secondary | ICD-10-CM | POA: Diagnosis not present

## 2023-05-17 DIAGNOSIS — N200 Calculus of kidney: Secondary | ICD-10-CM | POA: Insufficient documentation

## 2023-05-17 LAB — URINALYSIS, ROUTINE W REFLEX MICROSCOPIC
Bilirubin, UA: NEGATIVE
Glucose, UA: NEGATIVE
Ketones, UA: NEGATIVE
Leukocytes,UA: NEGATIVE
Nitrite, UA: NEGATIVE
Protein,UA: NEGATIVE
RBC, UA: NEGATIVE
Specific Gravity, UA: 1.03 (ref 1.005–1.030)
Urobilinogen, Ur: 0.2 mg/dL (ref 0.2–1.0)
pH, UA: 5.5 (ref 5.0–7.5)

## 2023-05-17 MED ORDER — TAMSULOSIN HCL 0.4 MG PO CAPS: 0.4000 mg | ORAL_CAPSULE | Freq: Every day | ORAL | 11 refills | Status: DC

## 2023-05-17 NOTE — Progress Notes (Unsigned)
05/17/2023 1:56 PM   Kyle Mccarthy Mar 02, 1956 [id number]  Referring provider: Lianne Moris, PA-C [address] [place],  [place] [address]  Chief Complaint  Patient presents with   Nephrolithiasis    HPI: Kyle Mccarthy is a 67yo here for followup for nocturia and nephrolithiasis. No stone events since last visit. Renal US shows concern for a left 15mm calculus. No flank pain. IPSS 6 QOl 2 on flomax 0.4mg  daily. Nocturia 1-2x.    PMH: Past Medical History:  Diagnosis Date   Anxiety    Depression    Hx of blood clots    left leg years ago   Hypertension    Sleep apnea     Surgical History: Past Surgical History:  Procedure Laterality Date   EXTRACORPOREAL SHOCK WAVE LITHOTRIPSY Left 03/15/2022   Procedure: EXTRACORPOREAL SHOCK WAVE LITHOTRIPSY (ESWL);  Surgeon: Milderd Meager., MD;  Location: [facility];  Service: Urology;  Laterality: Left;   EXTRACORPOREAL SHOCK WAVE LITHOTRIPSY Right 04/05/2022   Procedure: EXTRACORPOREAL SHOCK WAVE LITHOTRIPSY (ESWL);  Surgeon: Malen Gauze, MD;  Location: [facility];  Service: Urology;  Laterality: Right;   EYE SURGERY     cateract surgery 20 yrs ago   SKIN CANCER EXCISION      Home Medications:  Allergies as of 05/17/2023       Reactions   No Known Allergies         Medication List        Accurate as of May 17, 2023  1:56 PM. If you have any questions, ask your nurse or doctor.          STOP taking these medications    diclofenac Sodium 1 % Gel Commonly known as: VOLTAREN Stopped by: Wilkie Aye, MD   donepezil 5 MG tablet Commonly known as: ARICEPT Stopped by: Wilkie Aye, MD   lisinopril 20 MG tablet Commonly known as: ZESTRIL Stopped by: Wilkie Aye, MD   meloxicam 15 MG tablet Commonly known as: MOBIC Stopped by: Wilkie Aye, MD   methocarbamol 500 MG tablet Commonly known as: ROBAXIN Stopped by: Wilkie Aye, MD   oxybutynin 5 MG tablet Commonly known as: DITROPAN Stopped  by: Wilkie Aye, MD       TAKE these medications    atorvastatin 80 MG tablet Commonly known as: LIPITOR Take 80 mg by mouth daily.   cyclobenzaprine 5 MG tablet Commonly known as: FLEXERIL Take 1 tablet (5 mg total) by mouth 2 (two) times daily as needed for muscle spasms.   ibuprofen 800 MG tablet Commonly known as: ADVIL Take 800 mg by mouth every 8 (eight) hours as needed.   propranolol ER 80 MG 24 hr capsule Commonly known as: INDERAL LA Take 80 mg by mouth daily.   QUEtiapine 100 MG tablet Commonly known as: SEROQUEL Take 100 mg by mouth at bedtime.   sertraline 100 MG tablet Commonly known as: ZOLOFT Take 1 tablet (100 mg total) by mouth daily.   simvastatin 20 MG tablet Commonly known as: ZOCOR Take 20 mg by mouth at bedtime.   tamsulosin 0.4 MG Caps capsule Commonly known as: FLOMAX Take 1 capsule (0.4 mg total) by mouth daily.   Xarelto 20 MG Tabs tablet Generic drug: rivaroxaban Take 20 mg by mouth daily.   zolpidem 10 MG tablet Commonly known as: AMBIEN Take 1 tablet (10 mg total) by mouth at bedtime as needed for sleep.        Allergies:  Allergies  Allergen Reactions  No Known Allergies     Family History: Family History  Problem Relation Age of Onset   Alcohol abuse Maternal Uncle    Post-traumatic stress disorder Son     Social History:  reports that he has never smoked. He has never used smokeless tobacco. He reports current alcohol use of about 2.0 standard drinks of alcohol per week. He reports that he does not use drugs.  ROS: All other review of systems were reviewed and are negative except what is noted above in HPI  Physical Exam: BP (!) 149/69   Pulse 69   Constitutional:  Alert and oriented, No acute distress. HEENT: [place] AT, moist mucus membranes.  Trachea midline, no masses. Cardiovascular: No clubbing, cyanosis, or edema. Respiratory: Normal respiratory effort, no increased work of breathing. GI: Abdomen is  soft, nontender, nondistended, no abdominal masses GU: No CVA tenderness.  Lymph: No cervical or inguinal lymphadenopathy. Skin: No rashes, bruises or suspicious lesions. Neurologic: Grossly intact, no focal deficits, moving all 4 extremities. Psychiatric: Normal mood and affect.  Laboratory Data: Lab Results  Component Value Date   WBC 5.6 04/11/2023   HGB 13.0 04/11/2023   HCT 39.3 04/11/2023   MCV 94 04/11/2023   PLT 133 (L) 08/22/2019    Lab Results  Component Value Date   CREATININE 1.13 04/11/2023    No results found for: "PSA"  No results found for: "TESTOSTERONE"  Lab Results  Component Value Date   HGBA1C 5.5 04/11/2023    Urinalysis    Component Value Date/Time   APPEARANCEUR Clear 10/19/2022 1330   GLUCOSEU Negative 10/19/2022 1330   BILIRUBINUR Negative 10/19/2022 1330   PROTEINUR Negative 10/19/2022 1330   NITRITE Negative 10/19/2022 1330   LEUKOCYTESUR Negative 10/19/2022 1330    Lab Results  Component Value Date   LABMICR Comment 10/19/2022   WBCUA 0-5 06/01/2022   LABEPIT None seen 06/01/2022   MUCUS Many (A) 04/20/2022   BACTERIA None seen 06/01/2022    Pertinent Imaging: Renal US 04/19/2023: Images reviewed and discussed with the patient Results for orders placed during the hospital encounter of 04/20/22  Abdomen 1 view (KUB)  Narrative CLINICAL DATA:  Status post ESWL on right.  EXAM: ABDOMEN - 1 VIEW  COMPARISON:  Abdominal x-ray 04/05/2022, 03/04/2022.  CT 03/09/2022.  FINDINGS: 5 mm calcific density again noted over the right lower renal pole in unchanged position. No evidence of ureteral stone. Previously noted distal left ureteral stone no longer visualized. Aortoiliac and visceral atherosclerotic vascular calcification. Stool noted throughout the colon. No bowel distention. Lumbar spine scoliosis concave left. Stable appearing small sclerotic density noted over the left ilium, possibly a bone island. Degenerative changes  lumbar spine and both hips.  IMPRESSION: Stable appearing 5 mm stone again noted over the right lower renal pole. No evidence of ureteral stone. Previously noted distal left ureteral stone no longer visualized.   Electronically Signed By: Maisie Fus  Register M.D. On: 04/21/2022 07:35  No results found for this or any previous visit.  No results found for this or any previous visit.  No results found for this or any previous visit.  Results for orders placed during the hospital encounter of 04/19/23  Ultrasound renal complete  Narrative CLINICAL DATA:  Nephrolithiasis  EXAM: RENAL / URINARY TRACT ULTRASOUND COMPLETE  COMPARISON:  Renal ultrasound 10/18/2022  FINDINGS: Right Kidney:  Renal measurements: 11.0 x 5.1 x 6.0 cm = volume: 176.8 mL. The renal cortical thickness and echogenicity. No hydronephrosis. There is a simple  appearing 3.5 cm cyst superior pole right kidney. Opening follow-up needed.  Left Kidney:  Renal measurements: 12.5 x 6.3 x 5.6 cm = volume: 229.7 mL. Normal renal cortical thickness and echogenicity. No hydronephrosis. There is a 15 mm probable stone midpole left kidney.  Bladder:  Appears normal for degree of bladder distention.  Other:  None.  IMPRESSION: 1. No hydronephrosis. 2. Left nephrolithiasis.   Electronically Signed By: Annia Belt M.D. On: 04/21/2023 06:42  No valid procedures specified. No results found for this or any previous visit.  Results for orders placed in visit on 03/09/22  CT RENAL STONE STUDY  Narrative CLINICAL DATA:  Flank pain, history of nephrolithiasis, left lower quadrant pain for 1 week  EXAM: CT ABDOMEN AND PELVIS WITHOUT CONTRAST  TECHNIQUE: Multidetector CT imaging of the abdomen and pelvis was performed following the standard protocol without IV contrast.  RADIATION DOSE REDUCTION: This exam was performed according to the departmental dose-optimization program which includes  automated exposure control, adjustment of the mA and/or kV according to patient size and/or use of iterative reconstruction technique.  COMPARISON:  None available  FINDINGS: Lower chest: Scattered areas of bibasilar atelectasis. Subcentimeter calcified granuloma in the left lower lobe. Mild cardiac enlargement. No pericardial or pleural effusion.  Hepatobiliary: Limited without IV contrast. Round 4.4 cm hypodense lesion in the right liver posteriorly has relatively low Hounsfield measurements, image 17/2, but remains indeterminate by noncontrast imaging. This could be further evaluated with abdominal ultrasound to determine if the lesion is solid or cystic. There is an additional ill-defined subcapsular hypodense lesion more inferior in the right liver measuring 2.1 cm, image 27/2.  No biliary dilatation or obstruction pattern.  Cholelithiasis noted. Gallbladder nondistended. Common bile duct nondilated.  Pancreas: Unremarkable. No pancreatic ductal dilatation or surrounding inflammatory changes.  Spleen: Normal in size without focal abnormality.  Adrenals/Urinary Tract: Normal adrenal glands.  Kidneys demonstrate tiny punctate nonobstructing intrarenal calculi bilaterally. No acute hydronephrosis. Ureters are symmetric and decompressed.  However, the left distal ureter does demonstrate a nonobstructing 5 mm calculus, image 73/2 in the pelvis very close to the left UVJ.  Bladder unremarkable.  Stomach/Bowel: Negative for bowel obstruction, significant dilatation, ileus, or free air. Normal appendix demonstrated. Scattered left colon and sigmoid diverticulosis without acute inflammatory process.  No free fluid, fluid collection, hemorrhage, hematoma, abscess or ascites.  Vascular/Lymphatic: Limited without IV contrast. Aorta atherosclerotic. Negative for aneurysm. No retroperitoneal hemorrhage or hematoma.  No bulky adenopathy.  Nonspecific small nonenlarged  mesenteric root lymph nodes with slight mesenteric haziness, image 34/2. This can be seen with mesenteric adenitis/panniculitis.  Reproductive: Prostate gland is mildly enlarged. Seminal vesicles are symmetric. Prostate calcifications noted.  Other: No abdominal wall hernia or abnormality. No abdominopelvic ascites.  Musculoskeletal: Degenerative changes throughout the spine. Lower lumbar facet arthropathy. L5-S1 advanced degenerative disc disease noted. No acute osseous finding.  IMPRESSION: 5 mm nonobstructing left distal ureteral calculus close to the left UVJ.  Additional nonobstructing punctate intrarenal calculi bilaterally.  Right liver indeterminate hypodense lesions measuring up to 4.4 cm. Consider initial evaluation with nonemergent follow-up abdominal ultrasound to determine if solid or cystic.  Cholelithiasis  Aortic Atherosclerosis (ICD10-I70.0).   Electronically Signed By: Judie Petit.  Shick M.D. On: 03/09/2022 10:37   Assessment & Plan:    1. Kidney stones KUB today, iff stable followup 6 months with KUB - Urinalysis, Routine w reflex microscopic - Abdomen 1 view (KUB)  2. Nocturia Continue flomax 0.4mg  daily   No follow-ups on file.  Wilkie Aye, MD  [facility] [place]

## 2023-05-18 ENCOUNTER — Encounter: Payer: Self-pay | Admitting: Urology

## 2023-05-18 NOTE — Patient Instructions (Signed)

## 2023-06-04 ENCOUNTER — Observation Stay (HOSPITAL_COMMUNITY)
Admission: EM | Admit: 2023-06-04 | Discharge: 2023-06-06 | Disposition: A | Discharge status: Home / Self Care | Payer: 59 | Attending: Internal Medicine | Admitting: Internal Medicine

## 2023-06-04 ENCOUNTER — Emergency Department (HOSPITAL_COMMUNITY): Payer: 59

## 2023-06-04 ENCOUNTER — Other Ambulatory Visit: Payer: Self-pay

## 2023-06-04 ENCOUNTER — Encounter (HOSPITAL_COMMUNITY): Payer: Self-pay

## 2023-06-04 DIAGNOSIS — Z7901 Long term (current) use of anticoagulants: Secondary | ICD-10-CM | POA: Insufficient documentation

## 2023-06-04 DIAGNOSIS — I82409 Acute embolism and thrombosis of unspecified deep veins of unspecified lower extremity: Secondary | ICD-10-CM | POA: Diagnosis not present

## 2023-06-04 DIAGNOSIS — E669 Obesity, unspecified: Secondary | ICD-10-CM | POA: Diagnosis not present

## 2023-06-04 DIAGNOSIS — R569 Unspecified convulsions: Secondary | ICD-10-CM

## 2023-06-04 DIAGNOSIS — I10 Essential (primary) hypertension: Secondary | ICD-10-CM | POA: Insufficient documentation

## 2023-06-04 DIAGNOSIS — Z79899 Other long term (current) drug therapy: Secondary | ICD-10-CM | POA: Diagnosis not present

## 2023-06-04 DIAGNOSIS — G459 Transient cerebral ischemic attack, unspecified: Secondary | ICD-10-CM | POA: Diagnosis not present

## 2023-06-04 DIAGNOSIS — Z86718 Personal history of other venous thrombosis and embolism: Secondary | ICD-10-CM | POA: Diagnosis not present

## 2023-06-04 DIAGNOSIS — R4182 Altered mental status, unspecified: Secondary | ICD-10-CM | POA: Diagnosis present

## 2023-06-04 DIAGNOSIS — G4733 Obstructive sleep apnea (adult) (pediatric): Secondary | ICD-10-CM | POA: Diagnosis present

## 2023-06-04 LAB — CBC
HCT: 39.9 % (ref 39.0–52.0)
Hemoglobin: 13.2 g/dL (ref 13.0–17.0)
MCH: 32.1 pg (ref 26.0–34.0)
MCHC: 33.1 g/dL (ref 30.0–36.0)
MCV: 97.1 fL (ref 80.0–100.0)
Platelets: 140 10*3/uL — ABNORMAL LOW (ref 150–400)
RBC: 4.11 MIL/uL — ABNORMAL LOW (ref 4.22–5.81)
RDW: 13.2 % (ref 11.5–15.5)
WBC: 5.8 10*3/uL (ref 4.0–10.5)
nRBC: 0 % (ref 0.0–0.2)

## 2023-06-04 LAB — URINALYSIS, ROUTINE W REFLEX MICROSCOPIC
Bilirubin Urine: NEGATIVE
Glucose, UA: NEGATIVE mg/dL
Hgb urine dipstick: NEGATIVE
Ketones, ur: NEGATIVE mg/dL
Leukocytes,Ua: NEGATIVE
Nitrite: NEGATIVE
Protein, ur: NEGATIVE mg/dL
Specific Gravity, Urine: 1.017 (ref 1.005–1.030)
pH: 5 (ref 5.0–8.0)

## 2023-06-04 LAB — DIFFERENTIAL
Abs Immature Granulocytes: 0.01 10*3/uL (ref 0.00–0.07)
Basophils Absolute: 0 10*3/uL (ref 0.0–0.1)
Basophils Relative: 1 %
Eosinophils Absolute: 0.1 10*3/uL (ref 0.0–0.5)
Eosinophils Relative: 2 %
Immature Granulocytes: 0 %
Lymphocytes Relative: 27 %
Lymphs Abs: 1.5 10*3/uL (ref 0.7–4.0)
Monocytes Absolute: 0.4 10*3/uL (ref 0.1–1.0)
Monocytes Relative: 8 %
Neutro Abs: 3.6 10*3/uL (ref 1.7–7.7)
Neutrophils Relative %: 62 %

## 2023-06-04 LAB — COMPREHENSIVE METABOLIC PANEL
ALT: 13 U/L (ref 0–44)
AST: 16 U/L (ref 15–41)
Albumin: 4 g/dL (ref 3.5–5.0)
Alkaline Phosphatase: 77 U/L (ref 38–126)
Anion gap: 8 (ref 5–15)
BUN: 15 mg/dL (ref 8–23)
CO2: 26 mmol/L (ref 22–32)
Calcium: 9.3 mg/dL (ref 8.9–10.3)
Chloride: 107 mmol/L (ref 98–111)
Creatinine, Ser: 1.28 mg/dL — ABNORMAL HIGH (ref 0.61–1.24)
GFR, Estimated: >60 mL/min (ref >60)
Glucose, Bld: 93 mg/dL (ref 70–99)
Potassium: 4.3 mmol/L (ref 3.5–5.1)
Sodium: 141 mmol/L (ref 135–145)
Total Bilirubin: 0.9 mg/dL (ref 0.3–1.2)
Total Protein: 7.2 g/dL (ref 6.5–8.1)

## 2023-06-04 LAB — RAPID URINE DRUG SCREEN, HOSP PERFORMED
Amphetamines: NOT DETECTED
Barbiturates: NOT DETECTED
Benzodiazepines: NOT DETECTED
Cocaine: NOT DETECTED
Opiates: NOT DETECTED
Tetrahydrocannabinol: NOT DETECTED

## 2023-06-04 LAB — ETHANOL: Alcohol, Ethyl (B): <10 mg/dL (ref <10)

## 2023-06-04 LAB — PROTIME-INR
INR: 2.1 — ABNORMAL HIGH (ref 0.8–1.2)
Prothrombin Time: 23.4 seconds — ABNORMAL HIGH (ref 11.4–15.2)

## 2023-06-04 LAB — APTT: aPTT: 34 seconds (ref 24–36)

## 2023-06-04 NOTE — ED Provider Notes (Signed)
[place] EMERGENCY DEPARTMENT AT [facility] Provider Note   CSN: [id number] Arrival date & time: 06/04/23  1732     History {Add pertinent medical, surgical, social history, OB history to HPI:1} Chief Complaint  Patient presents with   Altered Mental Status   HPI Kyle Mccarthy is a 67 y.o. male    Altered Mental Status      Home Medications Prior to Admission medications   Medication Sig Start Date End Date Taking? Authorizing Provider  atorvastatin (LIPITOR) 80 MG tablet Take 80 mg by mouth daily. 03/31/23   [provider]  cyclobenzaprine (FLEXERIL) 5 MG tablet Take 1 tablet (5 mg total) by mouth 2 (two) times daily as needed for muscle spasms. 10/19/22   McKenzie, Mardene Celeste, MD  ibuprofen (ADVIL) 800 MG tablet Take 800 mg by mouth every 8 (eight) hours as needed. 01/03/23   [provider]  propranolol ER (INDERAL LA) 80 MG 24 hr capsule Take 80 mg by mouth daily. 04/10/23   [provider]  QUEtiapine (SEROQUEL) 100 MG tablet Take 100 mg by mouth at bedtime. 03/27/23   [provider]  sertraline (ZOLOFT) 100 MG tablet Take 1 tablet (100 mg total) by mouth daily. 04/13/21   Patrick North, MD  simvastatin (ZOCOR) 20 MG tablet Take 20 mg by mouth at bedtime. 06/24/19   [provider]  tamsulosin (FLOMAX) 0.4 MG CAPS capsule Take 1 capsule (0.4 mg total) by mouth daily after supper. 05/17/23   McKenzie, Mardene Celeste, MD  XARELTO 20 MG TABS tablet Take 20 mg by mouth daily. 12/15/21   [provider]  zolpidem (AMBIEN) 10 MG tablet Take 1 tablet (10 mg total) by mouth at bedtime as needed for sleep. 05/19/21 04/11/23  Cleotis Nipper, MD      Allergies    No known allergies    Review of Systems   Review of Systems  Physical Exam Updated Vital Signs BP (!) 144/84 (BP Location: Right Arm)   Pulse (!) 48   Temp 98.7 F (37.1 C) (Oral)   Resp 18   Ht 5\' 9"  (1.753 m)   Wt 120.6 kg   SpO2 95%   BMI 39.26 kg/m   Physical Exam  ED Results / Procedures / Treatments   Labs (all labs ordered are listed, but only abnormal results are displayed) Labs Reviewed  ETHANOL  PROTIME-INR  APTT  CBC  DIFFERENTIAL  COMPREHENSIVE METABOLIC PANEL  RAPID URINE DRUG SCREEN, HOSP PERFORMED  URINALYSIS, ROUTINE W REFLEX MICROSCOPIC  I-STAT CHEM 8, ED    EKG None  Radiology No results found.  Procedures Procedures  {Document cardiac monitor, telemetry assessment procedure when appropriate:1}  Medications Ordered in ED Medications - No data to display  ED Course/ Medical Decision Making/ A&P   {   Click here for ABCD2, HEART and other calculatorsREFRESH Note before signing :1}                          Medical Decision Making Amount and/or Complexity of Data Reviewed Labs: ordered. Radiology: ordered.   ***  {Document critical care time when appropriate:1} {Document review of labs and clinical decision tools ie heart score, Chads2Vasc2 etc:1}  {Document your independent review of radiology images, and any outside records:1} {Document your discussion with family members, caretakers, and with consultants:1} {Document social determinants of health affecting pt's care:1} {Document your decision making why or why not admission, treatments were  needed:1} Final Clinical Impression(s) / ED Diagnoses Final diagnoses:  None    Rx / DC Orders ED Discharge Orders     None

## 2023-06-04 NOTE — ED Triage Notes (Signed)
Pt bib family today. Family states that when he was sitting on the couch that patient started having jerking movements to the right arm. Son stated he noticed drooping right side of the mouth. Son states that pt has been confused today. Son states he laid over on the couch and was unresponsive for 45 seconds. Pt states he has been having headaches daily for a few years, pt states he has a headache now but it isn't any different than the headaches that he has been having the last few years. Pt does take Eliquis.

## 2023-06-04 NOTE — H&P (Signed)
History and Physical    Patient: Kyle Mccarthy WUX:[id number] DOB: 02-05-1956 DOA: 06/04/2023 DOS: the patient was seen and examined on 06/05/2023 PCP: Lianne Moris, PA-C  Patient coming from: Home  Chief Complaint:  Chief Complaint  Patient presents with   Altered Mental Status   HPI: Kyle Mccarthy is a 67 y.o. male with medical history significant of hypertension, obstructive sleep apnea, obesity, DVT, BPH, insomnia who presents to the emergency department after being brought in by family.  Apparently, patient was sitting on the sofa when he started to right arm jerking movements with a subsequent 45 Circon unresponsiveness..  Right-sided drooping was noted.  He complained of daily headaches which has been ongoing for 2 to 3 months and he was following a neurologist for this.  ED Course:  In the emergency department, he was bradycardic, BP was 144/84, other vital signs were within normal range.  Workup in the ED showed normal CBC and BMP except for creatinine of 1.28.  Urinalysis was normal, urine drug screen was normal, ethanol level was less than 10. CT head without contrast showed no acute intracranial abnormality or significant interval change. Patient's symptoms returned to baseline while still in the ED Neurologist was consulted and recommended further stroke workup. Hospitalist was asked to admit patient for further evaluation and management.  Review of Systems: Review of systems as noted in the HPI. All other systems reviewed and are negative.   Past Medical History:  Diagnosis Date   Anxiety    Depression    Hx of blood clots    left leg years ago   Hypertension    Sleep apnea    Past Surgical History:  Procedure Laterality Date   EXTRACORPOREAL SHOCK WAVE LITHOTRIPSY Left 03/15/2022   Procedure: EXTRACORPOREAL SHOCK WAVE LITHOTRIPSY (ESWL);  Surgeon: Milderd Meager., MD;  Location: [facility];  Service: Urology;  Laterality: Left;   EXTRACORPOREAL SHOCK  WAVE LITHOTRIPSY Right 04/05/2022   Procedure: EXTRACORPOREAL SHOCK WAVE LITHOTRIPSY (ESWL);  Surgeon: Malen Gauze, MD;  Location: [facility];  Service: Urology;  Laterality: Right;   EYE SURGERY     cateract surgery 20 yrs ago   SKIN CANCER EXCISION      Social History:  reports that he has never smoked. He has never used smokeless tobacco. He reports current alcohol use of about 2.0 standard drinks of alcohol per week. He reports that he does not use drugs.   Allergies  Allergen Reactions   No Known Allergies     Family History  Problem Relation Age of Onset   Alcohol abuse Maternal Uncle    Post-traumatic stress disorder Son      Prior to Admission medications   Medication Sig Start Date End Date Taking? Authorizing Provider  atorvastatin (LIPITOR) 80 MG tablet Take 80 mg by mouth daily. 03/31/23   [provider]  cyclobenzaprine (FLEXERIL) 5 MG tablet Take 1 tablet (5 mg total) by mouth 2 (two) times daily as needed for muscle spasms. 10/19/22   McKenzie, Mardene Celeste, MD  ibuprofen (ADVIL) 800 MG tablet Take 800 mg by mouth every 8 (eight) hours as needed. 01/03/23   [provider]  propranolol ER (INDERAL LA) 80 MG 24 hr capsule Take 80 mg by mouth daily. 04/10/23   [provider]  QUEtiapine (SEROQUEL) 100 MG tablet Take 100 mg by mouth at bedtime. 03/27/23   [provider]  sertraline (ZOLOFT) 100 MG tablet Take 1 tablet (100 mg total) by mouth  daily. 04/13/21   Patrick North, MD  simvastatin (ZOCOR) 20 MG tablet Take 20 mg by mouth at bedtime. 06/24/19   [provider]  tamsulosin (FLOMAX) 0.4 MG CAPS capsule Take 1 capsule (0.4 mg total) by mouth daily after supper. 05/17/23   McKenzie, Mardene Celeste, MD  XARELTO 20 MG TABS tablet Take 20 mg by mouth daily. 12/15/21   [provider]  zolpidem (AMBIEN) 10 MG tablet Take 1 tablet (10 mg total) by mouth at bedtime as needed for sleep. 05/19/21 04/11/23  Cleotis Nipper, MD     Physical Exam: BP 122/70 (BP Location: Right Arm)   Pulse 62   Temp 98.9 F (37.2 C) (Axillary)   Resp 19   Ht 5\' 9"  (1.753 m)   Wt 120.6 kg   SpO2 91%   BMI 39.26 kg/m   General: 67 y.o. year-old male well developed well nourished in no acute distress.  Alert and oriented x3. HEENT: NCAT, EOMI Neck: Supple, trachea medial Cardiovascular: Regular rate and rhythm with no rubs or gallops.  No thyromegaly or JVD noted.  No lower extremity edema. 2/4 pulses in all 4 extremities. Respiratory: Clear to auscultation with no wheezes or rales. Good inspiratory effort. Abdomen: Soft, nontender nondistended with normal bowel sounds x4 quadrants. Muskuloskeletal: No cyanosis, clubbing or edema noted bilaterally Neuro: CN II-XII intact, strength 5/5 x 4, sensation, reflexes intact Skin: No ulcerative lesions noted or rashes Psychiatry: Judgement and insight appear normal. Mood is appropriate for condition and setting          Labs on Admission:  Basic Metabolic Panel: Recent Labs  Lab 06/04/23 1811  NA 141  K 4.3  CL 107  CO2 26  GLUCOSE 93  BUN 15  CREATININE 1.28*  CALCIUM 9.3   Liver Function Tests: Recent Labs  Lab 06/04/23 1811  AST 16  ALT 13  ALKPHOS 77  BILITOT 0.9  PROT 7.2  ALBUMIN 4.0   No results for input(s): "LIPASE", "AMYLASE" in the last 168 hours. No results for input(s): "AMMONIA" in the last 168 hours. CBC: Recent Labs  Lab 06/04/23 1811  WBC 5.8  NEUTROABS 3.6  HGB 13.2  HCT 39.9  MCV 97.1  PLT 140*   Cardiac Enzymes: No results for input(s): "CKTOTAL", "CKMB", "CKMBINDEX", "TROPONINI" in the last 168 hours.  BNP (last 3 results) No results for input(s): "BNP" in the last 8760 hours.  ProBNP (last 3 results) No results for input(s): "PROBNP" in the last 8760 hours.  CBG: No results for input(s): "GLUCAP" in the last 168 hours.  Radiological Exams on Admission: CT HEAD CODE STROKE WO CONTRAST  Result Date: 06/04/2023 CLINICAL  DATA:  Code stroke. Jerking movements of his right arm. Right facial droop. Confusion. EXAM: CT HEAD WITHOUT CONTRAST TECHNIQUE: Contiguous axial images were obtained from the base of the skull through the vertex without intravenous contrast. RADIATION DOSE REDUCTION: This exam was performed according to the departmental dose-optimization program which includes automated exposure control, adjustment of the mA and/or kV according to patient size and/or use of iterative reconstruction technique. COMPARISON:  MR head without and with contrast 06/12/2022 FINDINGS: Brain: Mild atrophy and moderate white matter changes are similar the prior exam. No acute infarct, hemorrhage, or mass lesion is present. Deep brain nuclei are within normal limits. The ventricles are proportionate to the degree of atrophy. No significant extraaxial fluid collection is present. The brainstem and cerebellum are within normal limits. Midline structures are within normal limits. Vascular:  Atherosclerotic calcifications are present within the cavernous internal carotid arteries bilaterally. No hyperdense vessel is present. Skull: Calvarium is intact. No focal lytic or blastic lesions are present. No significant extracranial soft tissue lesion is present. Sinuses/Orbits: The paranasal sinuses and mastoid air cells are clear. Bilateral lens replacements are noted. Globes and orbits are otherwise unremarkable. IMPRESSION: 1. No acute intracranial abnormality or significant interval change. 2. Stable atrophy and moderate white matter disease. This likely reflects the sequela of chronic microvascular ischemia. These results were called by telephone at the time of interpretation on 06/04/2023 at 6:40 pm to provider Dr. Estell Harpin, who verbally acknowledged these results. Electronically Signed   By: Marin Roberts M.D.   On: 06/04/2023 18:42    EKG: I independently viewed the EKG done and my findings are as followed: Junctional rhythm at a rate of 48  bpm  Assessment/Plan Present on Admission:  TIA (transient ischemic attack)  Essential (primary) hypertension  Obesity  Obstructive sleep apnea  Principal Problem:   TIA (transient ischemic attack) Active Problems:   Essential (primary) hypertension   Obesity   Obstructive sleep apnea   DVT (deep venous thrombosis) (HCC)  TIA r/o Stroke Patient will be admitted to telemetry unit  CTA of head and neck in the morning Echocardiogram in the morning MRI of brain without contrast in the morning EEG will be done Continue aspirin and statin Continue fall precautions and neuro checks Lipid panel and hemoglobin A1c will be checked Continue PT/SLP/OT eval and treat Patient already passed bedside swallow eval by nursing  Tele neurology will be consulted and we shall await further recommendations status post imaging studies  Essential hypertension  Antihypertensives PRN if Blood pressure is greater than 220/120 or there is a concern for End organ damage/contraindications for permissive HTN. If blood pressure is greater than 220/120 give labetalol PO or IV or Vasotec IV with a goal of 15% reduction in BP during the first 24 hours.  DVT Home Eliquis will be temporarily held at this time  Obstructive sleep apnea Stable  Obesity (BMI 39.26) Continue diet and lifestyle modification  DVT prophylaxis: SCDs  Advance Care Planning: Full code  Consults: Teleneurology  Family Communication: None at bedside  Severity of Illness: The appropriate patient status for this patient is OBSERVATION. Observation status is judged to be reasonable and necessary in order to provide the required intensity of service to ensure the patient's safety. The patient's presenting symptoms, physical exam findings, and initial radiographic and laboratory data in the context of their medical condition is felt to place them at decreased risk for further clinical deterioration. Furthermore, it is anticipated that  the patient will be medically stable for discharge from the hospital within 2 midnights of admission.   Author: Frankey Shown, DO 06/05/2023 4:13 AM  For on call review [email or web address].

## 2023-06-04 NOTE — Progress Notes (Signed)
1857 - Code Stroke Activated  Patient already been to CT mRS 0, LKWT 1630 R side weakness, R arm jerking movement, a R facial droop patient on Xarelto for DVT 1902 - Dr. Otelia Limes paged 1906 - Dr. Otelia Limes on stroke cart

## 2023-06-04 NOTE — Consult Note (Signed)
[facility] TeleNeurology Consult Services    Date of Service:  06/04/2023      Metrics: Last Known Well: 1630 Symptoms: As per HPI.  Patient is not a candidate for thrombolytic: On Eliquis   Location of the provider: [facility]  Location of the patient: [facility] Pre-Morbid Modified Rankin Scale: 0 Time Code Stroke Page received:  7:02 PM Time neurologist arrived:  7:06 PM Time NIHSS completed: 7:23 PM    This consult was provided via telemedicine with 2-way video and audio communication. The patient/family was informed that care would be provided in this way and agreed to receive care in this manner.   ED Physician notified of diagnostic impression and management plan at: 7:28 PM   Assessment: 67 year old male with acute onset of expressive and receptive aphasia with RUE weakness and gait unsteadiness - Now back to baseline symptomatically.  - Exam is nonfocal. NIHSS 0.  - CT head: No acute intracranial bnormality or significant interval change. Stable atrophy and moderate white matter disease, likely reflecting the sequela of chronic microvascular ischemia. - Labs: UDS negative. Estimated GFR normal.  - Overall presentation is most consistent with left MCA territory TIA, despite current anticoagulation for DVT with Xarelto - Stroke risk factors: DVT, HTN and sleep apnea     Recommendations: - CTA of head and neck - TTE - MRI brain - Frequent neuro checks - IVF - HgbA1c, fasting lipid panel - Cardiac telemetry - PT consult, OT consult, Speech consult - Continue atorvastatin - Risk factor modification - He has passed bedside swallow screen - Heart healthy diet      ------------------------------------------------------------------------------   History of Present Illness: The patient is a 67 year old male with a PMHx of anxiety, depression, DVT on Xarelto, HTN and sleep apnea who presents to the ED after acute onset of right sided  weakness with garbled speech that was witnessed by his son at home. He also was experiencing an increase in his baseline chronic headache pain 2/10 >>> 7/10. LKN is the same as time of symptom recognition: 1630. He was at home with his son when his RUE started shaking with combined fine jittering movements and backwards movement of his elbow; concomitantly, his speech became completely garbled and he seemed unable to understand what his son was saying to him. Right facial droop was also seen. Speech was dysarthric in addition to the aphasia described above. RUE weakness was noted in conjunction with the jerking. When his son got him up, his gait was unsteady and he was falling to the right. Son drove him to the ED where a Code Stroke was called.      Past Medical History: Past Medical History:  Diagnosis Date   Anxiety    Depression    Hx of blood clots    left leg years ago   Hypertension    Sleep apnea       Past Surgical History: Past Surgical History:  Procedure Laterality Date   EXTRACORPOREAL SHOCK WAVE LITHOTRIPSY Left 03/15/2022   Procedure: EXTRACORPOREAL SHOCK WAVE LITHOTRIPSY (ESWL);  Surgeon: Milderd Meager., MD;  Location: [facility];  Service: Urology;  Laterality: Left;   EXTRACORPOREAL SHOCK WAVE LITHOTRIPSY Right 04/05/2022   Procedure: EXTRACORPOREAL SHOCK WAVE LITHOTRIPSY (ESWL);  Surgeon: Malen Gauze, MD;  Location: [facility];  Service: Urology;  Laterality: Right;   EYE SURGERY     cateract surgery 20 yrs ago   SKIN CANCER EXCISION  Medications:  No current facility-administered medications on file prior to encounter.   Current Outpatient Medications on File Prior to Encounter  Medication Sig Dispense Refill   atorvastatin (LIPITOR) 80 MG tablet Take 80 mg by mouth daily.     cyclobenzaprine (FLEXERIL) 5 MG tablet Take 1 tablet (5 mg total) by mouth 2 (two) times daily as needed for muscle spasms. 30 tablet 1   ibuprofen (ADVIL) 800 MG tablet Take  800 mg by mouth every 8 (eight) hours as needed.     propranolol ER (INDERAL LA) 80 MG 24 hr capsule Take 80 mg by mouth daily.     QUEtiapine (SEROQUEL) 100 MG tablet Take 100 mg by mouth at bedtime.     sertraline (ZOLOFT) 100 MG tablet Take 1 tablet (100 mg total) by mouth daily. 90 tablet 1   simvastatin (ZOCOR) 20 MG tablet Take 20 mg by mouth at bedtime.     tamsulosin (FLOMAX) 0.4 MG CAPS capsule Take 1 capsule (0.4 mg total) by mouth daily after supper. 30 capsule 11   XARELTO 20 MG TABS tablet Take 20 mg by mouth daily.     zolpidem (AMBIEN) 10 MG tablet Take 1 tablet (10 mg total) by mouth at bedtime as needed for sleep. 30 tablet 0         Social History: Drug Use: Never Light drinking.    Family History:  Reviewed in Epic   ROS: As per HPI    Anticoagulant use:  Xarelto   Antiplatelet use: No   Examination:    BP (!) 152/72   Pulse (!) 53   Temp 98.7 F (37.1 C) (Oral)   Resp 17   Ht 5\' 9"  (1.753 m)   Wt 120.6 kg   SpO2 95%   BMI 39.26 kg/m     1A: Level of Consciousness - 0 1B: Ask Month and Age - 0 1C: Blink Eyes & Squeeze Hands - 0 2: Test Horizontal Extraocular Movements - 0 3: Test Visual Fields - 0 4: Test Facial Palsy (Use Grimace if Obtunded) - 0 5A: Test Left Arm Motor Drift - 0 5B: Test Right Arm Motor Drift - 0 6A: Test Left Leg Motor Drift - 0 6B: Test Right Leg Motor Drift - 0 7: Test Limb Ataxia (FNF/Heel-Shin) - 0 8: Test Sensation -  0 9: Test Language/Aphasia - 0 10: Test Dysarthria - Severe Dysarthria: 0 11: Test Extinction/Inattention - Extinction to bilateral simultaneous stimulation 0   NIHSS Score: 0     Patient/Family was informed the Neurology Consult would occur via TeleHealth consult by way of interactive audio and video telecommunications and consented to receiving care in this manner.   Patient is being evaluated for possible acute neurologic impairment and high pretest probability of imminent or life-threatening  deterioration. I spent total of 40 minutes providing care to this patient, including time for face to face visit via telemedicine, review of medical records, imaging studies and discussion of findings with providers, the patient and/or family.   Electronically signed: Dr. Caryl Pina

## 2023-06-04 NOTE — ED Provider Notes (Signed)
Patient with TIA symptoms.  Neurology consulted and recommended hospital admission by the hospitalist with stroke workup.   Bethann Berkshire, MD 06/04/23 2244

## 2023-06-05 ENCOUNTER — Observation Stay (HOSPITAL_BASED_OUTPATIENT_CLINIC_OR_DEPARTMENT_OTHER): Payer: 59

## 2023-06-05 ENCOUNTER — Observation Stay (HOSPITAL_COMMUNITY): Payer: 59

## 2023-06-05 ENCOUNTER — Other Ambulatory Visit (HOSPITAL_COMMUNITY): Payer: Self-pay | Admitting: *Deleted

## 2023-06-05 ENCOUNTER — Observation Stay (HOSPITAL_COMMUNITY)
Admit: 2023-06-05 | Discharge: 2023-06-05 | Disposition: A | Discharge status: Home / Self Care | Payer: 59 | Attending: Internal Medicine | Admitting: Internal Medicine

## 2023-06-05 DIAGNOSIS — I82409 Acute embolism and thrombosis of unspecified deep veins of unspecified lower extremity: Secondary | ICD-10-CM | POA: Insufficient documentation

## 2023-06-05 DIAGNOSIS — G459 Transient cerebral ischemic attack, unspecified: Secondary | ICD-10-CM

## 2023-06-05 DIAGNOSIS — R569 Unspecified convulsions: Secondary | ICD-10-CM | POA: Diagnosis not present

## 2023-06-05 LAB — CBC
HCT: 39.2 % (ref 39.0–52.0)
Hemoglobin: 12.7 g/dL — ABNORMAL LOW (ref 13.0–17.0)
MCH: 31.9 pg (ref 26.0–34.0)
MCHC: 32.4 g/dL (ref 30.0–36.0)
MCV: 98.5 fL (ref 80.0–100.0)
Platelets: 131 10*3/uL — ABNORMAL LOW (ref 150–400)
RBC: 3.98 MIL/uL — ABNORMAL LOW (ref 4.22–5.81)
RDW: 12.9 % (ref 11.5–15.5)
WBC: 5.1 10*3/uL (ref 4.0–10.5)
nRBC: 0 % (ref 0.0–0.2)

## 2023-06-05 LAB — HIV ANTIBODY (ROUTINE TESTING W REFLEX): HIV Screen 4th Generation wRfx: NONREACTIVE

## 2023-06-05 LAB — COMPREHENSIVE METABOLIC PANEL
ALT: 13 U/L (ref 0–44)
AST: 25 U/L (ref 15–41)
Albumin: 3.5 g/dL (ref 3.5–5.0)
Alkaline Phosphatase: 69 U/L (ref 38–126)
Anion gap: 9 (ref 5–15)
BUN: 18 mg/dL (ref 8–23)
CO2: 24 mmol/L (ref 22–32)
Calcium: 8.6 mg/dL — ABNORMAL LOW (ref 8.9–10.3)
Chloride: 106 mmol/L (ref 98–111)
Creatinine, Ser: 1.25 mg/dL — ABNORMAL HIGH (ref 0.61–1.24)
GFR, Estimated: >60 mL/min (ref >60)
Glucose, Bld: 85 mg/dL (ref 70–99)
Potassium: 4.4 mmol/L (ref 3.5–5.1)
Sodium: 139 mmol/L (ref 135–145)
Total Bilirubin: 1.5 mg/dL — ABNORMAL HIGH (ref 0.3–1.2)
Total Protein: 6.3 g/dL — ABNORMAL LOW (ref 6.5–8.1)

## 2023-06-05 LAB — ECHOCARDIOGRAM COMPLETE
Area-P 1/2: 2.45 cm2
Height: 69 in
S' Lateral: 3.5 cm
Weight: 4254 oz

## 2023-06-05 LAB — LIPID PANEL
Cholesterol: 198 mg/dL (ref 0–200)
HDL: 66 mg/dL (ref >40)
LDL Cholesterol: 99 mg/dL (ref 0–99)
Total CHOL/HDL Ratio: 3 RATIO
Triglycerides: 163 mg/dL — ABNORMAL HIGH (ref <150)
VLDL: 33 mg/dL (ref 0–40)

## 2023-06-05 LAB — HEMOGLOBIN A1C
Hgb A1c MFr Bld: 5.2 % (ref 4.8–5.6)
Mean Plasma Glucose: 102.54 mg/dL

## 2023-06-05 LAB — PHOSPHORUS: Phosphorus: 3.4 mg/dL (ref 2.5–4.6)

## 2023-06-05 LAB — MAGNESIUM: Magnesium: 2.1 mg/dL (ref 1.7–2.4)

## 2023-06-05 MED ORDER — LACOSAMIDE 50 MG PO TABS
50.0000 mg | ORAL_TABLET | Freq: Two times a day (BID) | ORAL | Status: DC
  Administered 2023-06-05 – 2023-06-06 (×3): 50 mg via ORAL
  Filled 2023-06-05 (×3): qty 1

## 2023-06-05 MED ORDER — ONDANSETRON HCL 4 MG PO TABS: 4.0000 mg | ORAL_TABLET | Freq: Four times a day (QID) | ORAL | Status: DC | PRN

## 2023-06-05 MED ORDER — TRAZODONE HCL 50 MG PO TABS: 50.0000 mg | ORAL_TABLET | Freq: Every evening | ORAL | Status: DC | PRN

## 2023-06-05 MED ORDER — ONDANSETRON HCL 4 MG/2ML IJ SOLN: 4.0000 mg | Freq: Four times a day (QID) | INTRAMUSCULAR | Status: DC | PRN

## 2023-06-05 MED ORDER — ACETAMINOPHEN 650 MG RE SUPP: 650.0000 mg | Freq: Four times a day (QID) | RECTAL | Status: DC | PRN

## 2023-06-05 MED ORDER — IPRATROPIUM-ALBUTEROL 0.5-2.5 (3) MG/3ML IN SOLN: 3.0000 mL | RESPIRATORY_TRACT | Status: DC | PRN

## 2023-06-05 MED ORDER — SENNOSIDES-DOCUSATE SODIUM 8.6-50 MG PO TABS: 1.0000 | ORAL_TABLET | Freq: Every evening | ORAL | Status: DC | PRN

## 2023-06-05 MED ORDER — ACETAMINOPHEN 325 MG PO TABS: 650.0000 mg | ORAL_TABLET | Freq: Four times a day (QID) | ORAL | Status: DC | PRN

## 2023-06-05 MED ORDER — IOHEXOL 350 MG/ML SOLN
75.0000 mL | Freq: Once | INTRAVENOUS | Status: AC | PRN
  Administered 2023-06-05: 75 mL via INTRAVENOUS

## 2023-06-05 MED ORDER — ASPIRIN 81 MG PO TBEC
81.0000 mg | DELAYED_RELEASE_TABLET | Freq: Every day | ORAL | Status: DC
  Administered 2023-06-05 – 2023-06-06 (×2): 81 mg via ORAL
  Filled 2023-06-05 (×2): qty 1

## 2023-06-05 MED ORDER — ORAL CARE MOUTH RINSE: 15.0000 mL | OROMUCOSAL | Status: DC | PRN

## 2023-06-05 MED ORDER — LABETALOL HCL 5 MG/ML IV SOLN: 10.0000 mg | INTRAVENOUS | Status: DC | PRN

## 2023-06-05 MED ORDER — GUAIFENESIN 100 MG/5ML PO LIQD: 5.0000 mL | ORAL | Status: DC | PRN

## 2023-06-05 NOTE — Progress Notes (Signed)
Patient asymptomatic of right side weakness, or headache with NIH scale scores of 0. CT scan completed this morning MRI scheduled also. Patient ambulatory with natural gait in room and to bathroom.

## 2023-06-05 NOTE — Progress Notes (Signed)
1808 call time 1805 beeper time 1827 exam started 1828 exam finished 1830 exam completed in epic 1831 [place] rad called

## 2023-06-05 NOTE — Procedures (Signed)
Patient Name: Kyle Mccarthy  MRN: [id number]  Epilepsy Attending: Charlsie Quest  Referring Physician/Provider: Frankey Shown, DO  Date: 06/05/2023 Duration: 23.32 mins  Patient history: 67 year old male with transient speech disturbance and right upper extremity weakness as well as right upper extremity jerking for few minutes. EEG to evaluate for seizure  Level of alertness: Awake, asleep  AEDs during EEG study: LCM  Technical aspects: This EEG study was done with scalp electrodes positioned according to the 10-20 International system of electrode placement. Electrical activity was reviewed with band pass filter of 1-70Hz , sensitivity of 7 uV/mm, display speed of 27mm/sec with a 60Hz  notched filter applied as appropriate. EEG data were recorded continuously and digitally stored.  Video monitoring was available and reviewed as appropriate.  Description: The posterior dominant rhythm consists of 8-9 Hz activity of moderate voltage (25-35 uV) seen predominantly in posterior head regions, symmetric and reactive to eye opening and eye closing. Sleep was characterized by vertex waves, maximal frontocentral region. Hyperventilation and photic stimulation were not performed.     IMPRESSION: This study is within normal limits. No seizures or epileptiform discharges were seen throughout the recording.  A normal interictal EEG does not exclude the diagnosis of epilepsy.  Anabelle Bungert Annabelle Harman

## 2023-06-05 NOTE — Progress Notes (Addendum)
I connected with  Kyle Mccarthy on 06/05/23 by a video enabled telemedicine application and verified that I am speaking with the correct person using two identifiers.   I discussed the limitations of evaluation and management by telemedicine. The patient expressed understanding and agreed to proceed.  Location of patient: [facility]  Location of physician: [facility]  Subjective: No acute events overnight.  No new concerns.  Per son at bedside also has a diagnosis of epilepsy, he noted that patient had right upper extremity rhythmic jerking lasting for few minutes and was "drained of energy" after that.  Patient then states he has had similar episodes of upper extremity movement in the past which would eventually stop after a few minutes but thought these were tremors  ROS: negative except above  Examination  Vital signs in last 24 hours: Temp:  [98.4 F (36.9 C)-98.9 F (37.2 C)] 98.4 F (36.9 C) (06/17 0428) Pulse Rate:  [48-62] 54 (06/17 0809) Resp:  [13-19] 19 (06/16 2130) BP: (117-159)/(64-87) 159/73 (06/17 0809) SpO2:  [91 %-96 %] 95 % (06/17 0809) Weight:  [120.6 kg] 120.6 kg (06/16 1752)  General: lying in bed, NAD Neuro: MS: Alert, oriented, follows commands CN: pupils equal and reactive,  EOMI, face symmetric, tongue midline, normal sensation over face, Motor: Antigravity strength in all 4 extremities  NIHSS 0  Basic Metabolic Panel: Recent Labs  Lab 06/04/23 1811 06/05/23 0500  NA 141 139  K 4.3 4.4  CL 107 106  CO2 26 24  GLUCOSE 93 85  BUN 15 18  CREATININE 1.28* 1.25*  CALCIUM 9.3 8.6*  MG  --  2.1  PHOS  --  3.4    CBC: Recent Labs  Lab 06/04/23 1811 06/05/23 0500  WBC 5.8 5.1  NEUTROABS 3.6  --   HGB 13.2 12.7*  HCT 39.9 39.2  MCV 97.1 98.5  PLT 140* 131*     Coagulation Studies: Recent Labs    06/04/23 1811  LABPROT 23.4*  INR 2.1*    Imaging CT head without contrast 06/04/2023: No acute intracranial  abnormality or significant interval change. Stable atrophy and moderate white matter disease. This likely reflects the sequela of chronic microvascular ischemia.  CTA head and neck with and without contrast 06/05/2023:  Negative for large vessel occlusion. Mild to moderate calcified atherosclerosis in the bilateral neck and at the skull base. Up to moderate associated stenosis of the supraclinoid Left ICA. But no other hemodynamically significant stenosis in the head or neck.  Aortic Atherosclerosis (ICD10-I70.0).  ASSESSMENT AND PLAN: 67 year old male with transient speech disturbance and right upper extremity weakness as well as right upper extremity jerking for few minutes.  Transient neurological disturbances -Differentials include focal seizure versus TIA (patient reports missing dose of Xarelto in the morning).  No clear seizure provoking factors.  Recommendations -Patient's son has epilepsy and described rhythmic jerking of right upper extremity.  Patient reports similar episodes in the past.  Therefore recommend starting Vimpat 50 mg twice daily due to concern for focal motor seizures -MRI brain has been ordered and pending.  Of note, even if MRI brain shows stroke, patient is already on maximal medical therapy (Xarelto and atorvastatin 80 mg daily).  Did counsel patient about medication compliance -Routine EEG ordered and pending -Okay to resume Xarelto  -Discussed seizure precautions including do not drive for 6 months -Follow-up with Dr. Teresa Coombs in 2 to 4 weeks -Discussed plan with patient, son at bedside as well as Dr.  Amin via secure chat  I have spent a total of  36  minutes with the patient reviewing hospital notes,  test results, labs and examining the patient as well as establishing an assessment and plan that was discussed personally with the patient.  > 50% of time was spent in direct patient care.     Lindie Spruce Epilepsy [facility] For questions after 5pm  please refer to AMION to reach the Neurologist on call

## 2023-06-05 NOTE — Evaluation (Signed)
Physical Therapy Evaluation Patient Details Name: Kyle Mccarthy MRN: [id number] DOB: May 10, 1956 Today's Date: 06/05/2023  History of Present Illness  Kyle Mccarthy is a 67 y.o. male with medical history significant of hypertension, obstructive sleep apnea, obesity, DVT, BPH, insomnia who presents to the emergency department after being brought in by family.  Apparently, patient was sitting on the sofa when he started to right arm jerking movements with a subsequent 45 Circon unresponsiveness..  Right-sided drooping was noted.  He complained of daily headaches which has been ongoing for 2 to 3 months and he was following a neurologist for this.   Clinical Impression  Patient functioning near baseline other than c/o dizziness during ambulation and having to slow speed of cadence due to increasing low back pain.  Patient demonstrates good return for ambulating in room, hallway and going up down steps in stairwell without loss of balance.  Plan:  Patient discharged from physical therapy to care of nursing for ambulation daily as tolerated for length of stay.        Recommendations for follow up therapy are one component of a multi-disciplinary discharge planning process, led by the attending physician.  Recommendations may be updated based on patient status, additional functional criteria and insurance authorization.  Follow Up Recommendations       Assistance Recommended at Discharge Set up Supervision/Assistance  Patient can return home with the following  A little help with walking and/or transfers;Help with stairs or ramp for entrance;A little help with bathing/dressing/bathroom;Assistance with cooking/housework    Equipment Recommendations None recommended by PT  Recommendations for Other Services       Functional Status Assessment Patient has had a recent decline in their functional status and demonstrates the ability to make significant improvements in function in a reasonable  and predictable amount of time.     Precautions / Restrictions Precautions Precautions: Fall Restrictions Weight Bearing Restrictions: No      Mobility  Bed Mobility Overal bed mobility: Independent       Supine to sit: Independent          Transfers Overall transfer level: Needs assistance Equipment used: None Transfers: Sit to/from Stand, Bed to chair/wheelchair/BSC Sit to Stand: Supervision, Min guard   Step pivot transfers: Supervision, Min guard       General transfer comment: labored movement with c/o of dizziness initally upon standing    Ambulation/Gait Ambulation/Gait assistance: Supervision Gait Distance (Feet): 100 Feet Assistive device: None Gait Pattern/deviations: Decreased step length - right, Decreased step length - left, Decreased stride length Gait velocity: decreased     General Gait Details: slow slightly labored cadence without loss of balance, required occasional standing rest breaks mostly due to increasing low back pain  Stairs            Wheelchair Mobility    Modified Rankin (Stroke Patients Only)       Balance Overall balance assessment: Needs assistance Sitting-balance support: Feet supported, No upper extremity supported Sitting balance-Leahy Scale: Good Sitting balance - Comments: seated at EOB   Standing balance support: During functional activity, No upper extremity supported Standing balance-Leahy Scale: Fair Standing balance comment: fair/good without AD                             Pertinent Vitals/Pain Pain Assessment Pain Assessment: 0-10 Pain Score: 5  Pain Location: low back; reports headache as well Pain Descriptors / Indicators: Headache, Discomfort, Guarding Pain Intervention(s):  Limited activity within patient's tolerance, Monitored during session, Repositioned    Home Living Family/patient expects to be discharged to:: Private residence Living Arrangements: Children Available Help  at Discharge: Family;Available 24 hours/day Type of Home: House Home Access: Stairs to enter Entrance Stairs-Rails: None Entrance Stairs-Number of Steps: 2   Home Layout: Laundry or work area in basement;Able to live on main level with bedroom/bathroom Home Equipment: Other (comment) Additional Comments: walking stick    Prior Function Prior Level of Function : Independent/Modified Independent             Mobility Comments: Community ambulator with PRN walking stick  use. Drives. ADLs Comments: Independent     Hand Dominance   Dominant Hand: Right    Extremity/Trunk Assessment   Upper Extremity Assessment Upper Extremity Assessment: Defer to OT evaluation    Lower Extremity Assessment Lower Extremity Assessment: Overall WFL for tasks assessed    Cervical / Trunk Assessment Cervical / Trunk Assessment: Normal  Communication   Communication: No difficulties  Cognition Arousal/Alertness: Awake/alert Behavior During Therapy: WFL for tasks assessed/performed Overall Cognitive Status: Within Functional Limits for tasks assessed                                          General Comments      Exercises     Assessment/Plan    PT Assessment All further PT needs can be met in the next venue of care  PT Problem List Decreased strength;Decreased activity tolerance;Decreased balance;Decreased mobility       PT Treatment Interventions      PT Goals (Current goals can be found in the Care Plan section)  Acute Rehab PT Goals Patient Stated Goal: return home with family to assist PT Goal Formulation: With patient/family Time For Goal Achievement: 06/05/23 Potential to Achieve Goals: Good    Frequency       Co-evaluation PT/OT/SLP Co-Evaluation/Treatment: Yes Reason for Co-Treatment: To address functional/ADL transfers PT goals addressed during session: Mobility/safety with mobility;Balance         AM-PAC PT "6 Clicks" Mobility  Outcome  Measure Help needed turning from your back to your side while in a flat bed without using bedrails?: None Help needed moving from lying on your back to sitting on the side of a flat bed without using bedrails?: None Help needed moving to and from a bed to a chair (including a wheelchair)?: None Help needed standing up from a chair using your arms (e.g., wheelchair or bedside chair)?: A Little Help needed to walk in hospital room?: A Little Help needed climbing 3-5 steps with a railing? : A Little 6 Click Score: 21    End of Session   Activity Tolerance: Patient tolerated treatment well;Patient limited by fatigue;Patient limited by pain Patient left: in bed;with call bell/phone within reach;with family/visitor present Nurse Communication: Mobility status PT Visit Diagnosis: Unsteadiness on feet (R26.81);Other abnormalities of gait and mobility (R26.89);Muscle weakness (generalized) (M62.81)    Time: 4332-9518 PT Time Calculation (min) (ACUTE ONLY): 20 min   Charges:   PT Evaluation $PT Eval Moderate Complexity: 1 Mod PT Treatments $Therapeutic Activity: 8-22 mins        1:48 PM, 06/05/23 Ocie Bob, MPT Physical Therapist with [facility] 336 [phone] office [phone] mobile phone

## 2023-06-05 NOTE — Evaluation (Signed)
Occupational Therapy Evaluation Patient Details Name: Kyle Mccarthy MRN: [id number] DOB: 08/25/1956 Today's Date: 06/05/2023   History of Present Illness Kyle Mccarthy is a 67 y.o. male with medical history significant of hypertension, obstructive sleep apnea, obesity, DVT, BPH, insomnia who presents to the emergency department after being brought in by family.  Apparently, patient was sitting on the sofa when he started to right arm jerking movements with a subsequent 45 Circon unresponsiveness..  Right-sided drooping was noted.  He complained of daily headaches which has been ongoing for 2 to 3 months and he was following a neurologist for this. (per DO)   Clinical Impression   Pt agreeable to PT and OT co-evaluation. Pt appears to be near baseline function with most limitation from low back pain and reports of dizziness. Supervision to min G assist while ambulating with some rest breaks. Pt is able to complete ADL's without assist if seated.  No significant R side weakness. Reports of increased pain when tracking an object at midline, but was able to do so well. Pt is not recommended for further acute OT services and will be discharged to care of nursing staff for remaining length of stay.      Recommendations for follow up therapy are one component of a multi-disciplinary discharge planning process, led by the attending physician.  Recommendations may be updated based on patient status, additional functional criteria and insurance authorization.   Assistance Recommended at Discharge PRN  Patient can return home with the following A little help with walking and/or transfers    Functional Status Assessment  Patient has not had a recent decline in their functional status (Limited mostly by dizziness and pain in back.)  Equipment Recommendations  None recommended by OT    Recommendations for Other Services       Precautions / Restrictions Precautions Precautions:  Fall Restrictions Weight Bearing Restrictions: No      Mobility Bed Mobility Overal bed mobility: Independent Bed Mobility: Supine to Sit     Supine to sit: Independent          Transfers Overall transfer level: Needs assistance Equipment used: None Transfers: Sit to/from Stand, Bed to chair/wheelchair/BSC Sit to Stand: Supervision, Min guard     Step pivot transfers: Supervision, Min guard     General transfer comment: Mildly unsteady in standing at first. Some improvements with increased ambulation.      Balance Overall balance assessment: Mild deficits observed, not formally tested                                         ADL either performed or assessed with clinical judgement   ADL Overall ADL's : Needs assistance/impaired     Grooming: Supervision/safety;Min guard;Standing   Upper Body Bathing: Independent   Lower Body Bathing: Independent   Upper Body Dressing : Independent   Lower Body Dressing: Independent   Toilet Transfer: Min guard;Supervision/safety;Ambulation   Toileting- Clothing Manipulation and Hygiene: Modified independent;Sit to/from stand;Sitting/lateral lean       Functional mobility during ADLs: Supervision/safety;Min guard General ADL Comments: Pt able to ambulate over 100 feet in the hall with intermittent rest due to pain from bulging disk in back.     Vision Baseline Vision/History: 0 No visual deficits Ability to See in Adequate Light: 0 Adequate Patient Visual Report: No change from baseline Vision Assessment?: Yes Additional Comments: Able to track  an object well, but with reports of some pain when tracking. No visual field deficit noted.                Pertinent Vitals/Pain Pain Assessment Pain Assessment: 0-10 Pain Score: 5  Pain Location: low back; reports headache as well Pain Descriptors / Indicators: Headache, Discomfort, Guarding Pain Intervention(s): Limited activity within patient's  tolerance, Monitored during session, Repositioned     Hand Dominance Right   Extremity/Trunk Assessment Upper Extremity Assessment Upper Extremity Assessment: RUE deficits/detail;LUE deficits/detail RUE Deficits / Details: Very mild wrist flexion and extension weakness. No other significant issues. RUE Sensation: WNL RUE Coordination: WNL LUE Deficits / Details: Mild L gross motor difficulty with finger to nose test. LUE Sensation: WNL   Lower Extremity Assessment Lower Extremity Assessment: Defer to PT evaluation   Cervical / Trunk Assessment Cervical / Trunk Assessment: Normal   Communication Communication Communication: No difficulties   Cognition Arousal/Alertness: Awake/alert Behavior During Therapy: WFL for tasks assessed/performed Overall Cognitive Status: Within Functional Limits for tasks assessed                                       General Comments  Reports of dizziness while standing.               Home Living Family/patient expects to be discharged to:: Private residence Living Arrangements: Children Available Help at Discharge: Family;Available 24 hours/day Type of Home: House Home Access: Stairs to enter [employer] of Steps: 2 Entrance Stairs-Rails: None Home Layout: Laundry or work area in basement;Able to live on main level with bedroom/bathroom     Bathroom Shower/Tub: Producer, television/film/video: Handicapped height Bathroom Accessibility: No   Home Equipment: Other (comment) (walking stick)          Prior Functioning/Environment Prior Level of Function : Independent/Modified Independent             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 Drives. ADLs Comments: Independent                                Co-evaluation PT/OT/SLP Co-Evaluation/Treatment: Yes Reason for Co-Treatment: To address functional/ADL transfers   OT goals addressed during session:  ADL's and self-care      AM-PAC OT "6 Clicks" Daily Activity     Outcome Measure Help from another person eating meals?: None Help from another person taking care of personal grooming?: None Help from another person toileting, which includes using toliet, bedpan, or urinal?: None Help from another person bathing (including washing, rinsing, drying)?: None Help from another person to put on and taking off regular upper body clothing?: None Help from another person to put on and taking off regular lower body clothing?: None 6 Click Score: 24   End of Session    Activity Tolerance: Patient tolerated treatment well Patient left: Other (comment);with family/visitor present (Standing and ambulating to the bathroom with children present.)  OT Visit Diagnosis: Unsteadiness on feet (R26.81);Other symptoms and signs involving the nervous system (R29.898)                Time: 5284-1324 OT Time Calculation (min): 15 min Charges:  OT General Charges $OT Visit: 1 Visit OT Evaluation $OT Eval Low Complexity: 1 Low  Davionna Blacksher OT, MOT   Danie Chandler 06/05/2023, 9:06  AM

## 2023-06-05 NOTE — Progress Notes (Signed)
*  PRELIMINARY RESULTS* Echocardiogram 2D Echocardiogram has been performed.  Kyle Mccarthy 06/05/2023, 3:09 PM

## 2023-06-05 NOTE — Progress Notes (Signed)
EEG complete - results pending 

## 2023-06-05 NOTE — Progress Notes (Signed)
PROGRESS NOTE    Kyle Mccarthy  UJW:[id number] DOB: 09-Jun-1956 DOA: 06/04/2023 PCP: Lianne Moris, PA-C   Brief Narrative:  67 y.o. male with medical history significant of hypertension, obstructive sleep apnea, obesity, DVT, BPH, insomnia who presents to the emergency department after being brought in by family.  Apparently, patient was sitting on the sofa when he started to right arm jerking movements with a subsequent 45 secs unresponsiveness..  Right-sided drooping was noted.  He complained of daily headaches which has been ongoing for 2 to 3 months and he was following a neurologist for this.  CT of the head negative, neurology was consulted.     Assessment & Plan:  Principal Problem:   TIA (transient ischemic attack) Active Problems:   Essential (primary) hypertension   Obesity   Obstructive sleep apnea   DVT (deep venous thrombosis) (HCC)     TIA r/o Stroke Right-sided weakness/droop - CT of the head and CTA head and neck are negative for any acute pathology or LVO.  MRI brain pending.  Neurology consulted. - LDL 99, A1c 5.2 - PT/OT/speech therapy.   Essential hypertension -Permissive hypertension   DVT -On home Eliquis   Obstructive sleep apnea Stable   Obesity (BMI 39.26) Continue diet and lifestyle modification    [facility] REC PENDING, PHARM TECH/PHARMACY NOTIFIED.    DVT prophylaxis: On Eliquis Code Status: Full Family Communication: Son at bedside Ongoing evaluation for stroke and seizure.  Discharge once cleared by neurology       Diet Orders (From admission, onward)     Start     Ordered   06/05/23 0334  Diet heart healthy/carb modified Room service appropriate? Yes; Fluid consistency: Thin  Diet effective now       Question Answer Comment  Diet-HS Snack? Nothing   Room service appropriate? Yes   Fluid consistency: Thin      06/05/23 0334            Subjective: Patient seen at bedside.  Overall has been dealing with  short-term memory loss for the past several weeks to months.  At the time of my visit is never any complaints   Examination:  General exam: Appears calm and comfortable  Respiratory system: Clear to auscultation. Respiratory effort normal. Cardiovascular system: S1 & S2 heard, RRR. No JVD, murmurs, rubs, gallops or clicks. No pedal edema. Gastrointestinal system: Abdomen is nondistended, soft and nontender. No organomegaly or masses felt. Normal bowel sounds heard. Central nervous system: Alert and oriented. No focal neurological deficits. Extremities: Symmetric 5 x 5 power. Skin: No rashes, lesions or ulcers Psychiatry: Judgement and insight appear normal. Mood & affect appropriate.  Objective: Vitals:   06/04/23 2115 06/04/23 2130 06/05/23 0428 06/05/23 0809  BP: 117/77 122/70 (!) 145/87 (!) 159/73  Pulse: 60 62 (!) 57 (!) 54  Resp:  19    Temp:  98.9 F (37.2 C) 98.4 F (36.9 C)   TempSrc:  Axillary Oral   SpO2: 92% 91% 96% 95%  Weight:      Height:       No intake or output data in the 24 hours ending 06/05/23 0842 Filed Weights   06/04/23 1752  Weight: 120.6 kg    Scheduled Meds: Continuous Infusions:  Nutritional status     Body mass index is 39.26 kg/m.  Data Reviewed:   CBC: Recent Labs  Lab 06/04/23 1811 06/05/23 0500  WBC 5.8 5.1  NEUTROABS 3.6  --   HGB 13.2 12.7*  HCT 39.9 39.2  MCV 97.1 98.5  PLT 140* 131*   Basic Metabolic Panel: Recent Labs  Lab 06/04/23 1811 06/05/23 0500  NA 141 139  K 4.3 4.4  CL 107 106  CO2 26 24  GLUCOSE 93 85  BUN 15 18  CREATININE 1.28* 1.25*  CALCIUM 9.3 8.6*  MG  --  2.1  PHOS  --  3.4   GFR: Estimated Creatinine Clearance: 71.6 mL/min (A) (by C-G formula based on SCr of 1.25 mg/dL (H)). Liver Function Tests: Recent Labs  Lab 06/04/23 1811 06/05/23 0500  AST 16 25  ALT 13 13  ALKPHOS 77 69  BILITOT 0.9 1.5*  PROT 7.2 6.3*  ALBUMIN 4.0 3.5   No results for input(s): "LIPASE", "AMYLASE" in  the last 168 hours. No results for input(s): "AMMONIA" in the last 168 hours. Coagulation Profile: Recent Labs  Lab 06/04/23 1811  INR 2.1*   Cardiac Enzymes: No results for input(s): "CKTOTAL", "CKMB", "CKMBINDEX", "TROPONINI" in the last 168 hours. BNP (last 3 results) No results for input(s): "PROBNP" in the last 8760 hours. HbA1C: No results for input(s): "HGBA1C" in the last 72 hours. CBG: No results for input(s): "GLUCAP" in the last 168 hours. Lipid Profile: No results for input(s): "CHOL", "HDL", "LDLCALC", "TRIG", "CHOLHDL", "LDLDIRECT" in the last 72 hours. Thyroid Function Tests: No results for input(s): "TSH", "T4TOTAL", "FREET4", "T3FREE", "THYROIDAB" in the last 72 hours. Anemia Panel: No results for input(s): "VITAMINB12", "FOLATE", "FERRITIN", "TIBC", "IRON", "RETICCTPCT" in the last 72 hours. Sepsis Labs: No results for input(s): "PROCALCITON", "LATICACIDVEN" in the last 168 hours.  No results found for this or any previous visit (from the past 240 hour(s)).       Radiology Studies: CT ANGIO HEAD NECK W WO CM (CODE STROKE)  Result Date: 06/05/2023 CLINICAL DATA:  67 year old male code stroke presentation yesterday. Altered mental status. EXAM: CT ANGIOGRAPHY HEAD AND NECK TECHNIQUE: Multidetector CT imaging of the head and neck was performed using the standard protocol during bolus administration of intravenous contrast. Multiplanar CT image reconstructions and MIPs were obtained to evaluate the vascular anatomy. Carotid stenosis measurements (when applicable) are obtained utilizing NASCET criteria, using the distal internal carotid diameter as the denominator. RADIATION DOSE REDUCTION: This exam was performed according to the departmental dose-optimization program which includes automated exposure control, adjustment of the mA and/or kV according to patient size and/or use of iterative reconstruction technique. CONTRAST:  75mL OMNIPAQUE IOHEXOL 350 MG/ML SOLN  COMPARISON:  Head CT 06/04/2023.  Brain MRI 06/12/2022. FINDINGS: CTA NECK Skeleton: No acute osseous abnormality identified. Cervical spine degeneration. Flowing upper thoracic endplate osteophytes resulting in some thoracic interbody ankylosis. Upper chest: Calcified coronary artery atherosclerosis on series 5, image 201. Otherwise negative visible mediastinum. Visible major airways are patent. Mild lung atelectasis. Small calcified granuloma along the left major fissure. Other neck: No acute finding. Aortic arch: 3 vessel arch.  Mild Calcified aortic atherosclerosis. Right carotid system: Tortuous brachiocephalic artery and right CCA origin with no significant plaque or stenosis. Calcified more so than soft plaque at the right carotid bifurcation, right ICA origin and bulb is mild-to-moderate. No stenosis to the skull base. Left carotid system: Similar mild tortuosity and atherosclerosis without stenosis. Vertebral arteries: Calcified right subclavian artery origin with no significant stenosis. Subtle calcified plaque at the right vertebral artery origin not causing stenosis. Right vertebral artery is patent and otherwise normal to the skull base. Mild proximal left subclavian artery calcified plaque without stenosis. Mild calcified plaque near  the left vertebral artery origin without stenosis. Tortuous left V1 segment. Left vertebral artery is slightly dominant and patent to the skull base with no significant plaque or stenosis. CTA HEAD Posterior circulation: Distal vertebral arteries and vertebrobasilar junction are patent without plaque or stenosis. Normal PICA origins. Patent basilar artery, SCA and PCA origins. Both posterior communicating arteries are present. Bilateral PCA branches are within normal limits. Anterior circulation: Both ICA siphons are patent. Moderate left siphon calcified plaque, most pronounced in the supraclinoid segment on series 8, image 109 with mild-to-moderate stenosis there.  Ectatic left ICA cavernous segment. Contralateral right ICA siphon similar calcified plaque and mild ectasia. No significant right siphon stenosis. Patent carotid termini. Normal MCA and ACA origins. Anterior communicating artery and bilateral ACA branches are within normal limits. Left MCA M1 segment, bifurcation are patent without stenosis. Right MCA M1 segment and bifurcation are patent without stenosis. Bilateral MCA branches are within normal limits. Venous sinuses: Patent. Anatomic variants: Mildly dominant left vertebral artery. Review of the MIP images confirms the above findings IMPRESSION: 1. Negative for large vessel occlusion. 2. Mild to moderate calcified atherosclerosis in the bilateral neck and at the skull base. Up to moderate associated stenosis of the supraclinoid Left ICA. But no other hemodynamically significant stenosis in the head or neck. 3.  Aortic Atherosclerosis (ICD10-I70.0). Electronically Signed   By: Odessa Fleming M.D.   On: 06/05/2023 05:10   CT HEAD CODE STROKE WO CONTRAST  Result Date: 06/04/2023 CLINICAL DATA:  Code stroke. Jerking movements of his right arm. Right facial droop. Confusion. EXAM: CT HEAD WITHOUT CONTRAST TECHNIQUE: Contiguous axial images were obtained from the base of the skull through the vertex without intravenous contrast. RADIATION DOSE REDUCTION: This exam was performed according to the departmental dose-optimization program which includes automated exposure control, adjustment of the mA and/or kV according to patient size and/or use of iterative reconstruction technique. COMPARISON:  MR head without and with contrast 06/12/2022 FINDINGS: Brain: Mild atrophy and moderate white matter changes are similar the prior exam. No acute infarct, hemorrhage, or mass lesion is present. Deep brain nuclei are within normal limits. The ventricles are proportionate to the degree of atrophy. No significant extraaxial fluid collection is present. The brainstem and cerebellum are  within normal limits. Midline structures are within normal limits. Vascular: Atherosclerotic calcifications are present within the cavernous internal carotid arteries bilaterally. No hyperdense vessel is present. Skull: Calvarium is intact. No focal lytic or blastic lesions are present. No significant extracranial soft tissue lesion is present. Sinuses/Orbits: The paranasal sinuses and mastoid air cells are clear. Bilateral lens replacements are noted. Globes and orbits are otherwise unremarkable. IMPRESSION: 1. No acute intracranial abnormality or significant interval change. 2. Stable atrophy and moderate white matter disease. This likely reflects the sequela of chronic microvascular ischemia. These results were called by telephone at the time of interpretation on 06/04/2023 at 6:40 pm to provider Dr. Estell Harpin, who verbally acknowledged these results. Electronically Signed   By: Marin Roberts M.D.   On: 06/04/2023 18:42           LOS: 0 days   Time spent= 35 mins    Lamarion Mcevers Joline Maxcy, MD [facility]  If 7PM-7AM, please contact night-coverage  06/05/2023, 8:42 AM

## 2023-06-05 NOTE — TOC Initial Note (Signed)
Transition of Care [facility]) - Initial/Assessment Note    Patient Details  Name: Kyle Mccarthy MRN: [id number] Date of Birth: 18-Nov-1956  Transition of Care [facility]) CM/SW Contact:    Villa Herb, LCSWA Phone Number: 06/05/2023, 11:53 AM  Clinical Narrative:                 CSW updated that PT is recommending outpatient PT services for pt at D/C. CSW spoke with pt who requests referral be sent to [facility] at [place]. CSW spoke to Tammy who requested that CSW sent OP PT order signed by MD and demographics page. CSW faxed at their request. TOC to follow.   Expected Discharge Plan: OP Rehab Barriers to Discharge: Continued Medical Work up   Patient Goals and CMS Choice Patient states their goals for this hospitalization and ongoing recovery are:: return home CMS Medicare.gov Compare Post Acute Care list provided to:: Patient Choice offered to / list presented to : Patient      Expected Discharge Plan and Services In-house Referral: Clinical Social Work Discharge Planning Services: CM Consult   Living arrangements for the past 2 months: Single Family Home                                      Prior Living Arrangements/Services Living arrangements for the past 2 months: Single Family Home Lives with:: Self Patient language and need for interpreter reviewed:: Yes Do you feel safe going back to the place where you live?: Yes      Need for Family Participation in Patient Care: Yes (Comment) Care giver support system in place?: Yes (comment)   Criminal Activity/Legal Involvement Pertinent to Current Situation/Hospitalization: No - Comment as needed  Activities of Daily Living Home Assistive Devices/Equipment: None ADL Screening (condition at time of admission) Patient's cognitive ability adequate to safely complete daily activities?: Yes Is the patient deaf or have difficulty hearing?: No Does the patient have difficulty seeing, even when wearing  glasses/contacts?: No Does the patient have difficulty concentrating, remembering, or making decisions?: No Patient able to express need for assistance with ADLs?: Yes Does the patient have difficulty dressing or bathing?: No Independently performs ADLs?: Yes (appropriate for developmental age) Does the patient have difficulty walking or climbing stairs?: No Weakness of Legs: None Weakness of Arms/Hands: None  Permission Sought/Granted                  Emotional Assessment Appearance:: Appears stated age Attitude/Demeanor/Rapport: Engaged Affect (typically observed): Accepting Orientation: : Oriented to Self, Oriented to Place, Oriented to  Time, Oriented to Situation Alcohol / Substance Use: Not Applicable Psych Involvement: No (comment)  Admission diagnosis:  TIA (transient ischemic attack) [G45.9] Patient Active Problem List   Diagnosis Date Noted   DVT (deep venous thrombosis) (HCC) 06/05/2023   TIA (transient ischemic attack) 06/04/2023   Obstructive sleep apnea 04/11/2023   Daily headache 04/11/2023   Cognitive impairment 05/12/2022   Precordial pain 05/14/2015   Depression 04/14/2009   Insomnia 04/14/2009   Actinic keratosis 11/03/2008   Edema leg 11/03/2008   Essential (primary) hypertension 11/03/2008   Fatigue 11/03/2008   Obesity 11/03/2008   Sleep apnea 11/03/2008   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 [place] [address] Phone:  [phone] Fax: [phone]     Social Determinants of Health (SDOH) Social History: SDOH Screenings   Food Insecurity: No Food Insecurity (06/05/2023)  Housing: Low Risk  (06/05/2023)  Transportation Needs: No Transportation Needs (06/05/2023)  Utilities: Not At Risk (06/05/2023)  Alcohol Screen: Low Risk  (08/21/2019)  Tobacco Use: Low Risk  (06/04/2023)   SDOH  Interventions:     Readmission Risk Interventions     No data to display

## 2023-06-06 ENCOUNTER — Other Ambulatory Visit: Payer: Self-pay | Admitting: Internal Medicine

## 2023-06-06 DIAGNOSIS — G459 Transient cerebral ischemic attack, unspecified: Secondary | ICD-10-CM | POA: Diagnosis not present

## 2023-06-06 DIAGNOSIS — R569 Unspecified convulsions: Secondary | ICD-10-CM

## 2023-06-06 LAB — BASIC METABOLIC PANEL
Anion gap: 8 (ref 5–15)
BUN: 19 mg/dL (ref 8–23)
CO2: 25 mmol/L (ref 22–32)
Calcium: 8.7 mg/dL — ABNORMAL LOW (ref 8.9–10.3)
Chloride: 106 mmol/L (ref 98–111)
Creatinine, Ser: 1.12 mg/dL (ref 0.61–1.24)
GFR, Estimated: >60 mL/min (ref >60)
Glucose, Bld: 95 mg/dL (ref 70–99)
Potassium: 3.5 mmol/L (ref 3.5–5.1)
Sodium: 139 mmol/L (ref 135–145)

## 2023-06-06 LAB — CBC
HCT: 37.8 % — ABNORMAL LOW (ref 39.0–52.0)
Hemoglobin: 12.7 g/dL — ABNORMAL LOW (ref 13.0–17.0)
MCH: 32.1 pg (ref 26.0–34.0)
MCHC: 33.6 g/dL (ref 30.0–36.0)
MCV: 95.5 fL (ref 80.0–100.0)
Platelets: 128 10*3/uL — ABNORMAL LOW (ref 150–400)
RBC: 3.96 MIL/uL — ABNORMAL LOW (ref 4.22–5.81)
RDW: 12.8 % (ref 11.5–15.5)
WBC: 5.4 10*3/uL (ref 4.0–10.5)
nRBC: 0 % (ref 0.0–0.2)

## 2023-06-06 LAB — MAGNESIUM: Magnesium: 2 mg/dL (ref 1.7–2.4)

## 2023-06-06 MED ORDER — LACOSAMIDE 50 MG PO TABS: 50.0000 mg | ORAL_TABLET | Freq: Two times a day (BID) | ORAL | Status: DC

## 2023-06-06 MED ORDER — POTASSIUM CHLORIDE CRYS ER 20 MEQ PO TBCR
40.0000 meq | EXTENDED_RELEASE_TABLET | Freq: Once | ORAL | Status: AC
  Administered 2023-06-06: 40 meq via ORAL
  Filled 2023-06-06: qty 2

## 2023-06-06 MED ORDER — LACOSAMIDE 50 MG PO TABS
50.0000 mg | ORAL_TABLET | Freq: Two times a day (BID) | ORAL | Status: AC
Start: 2023-06-06 — End: 2023-07-06

## 2023-06-06 MED ORDER — LACOSAMIDE 50 MG PO TABS: 50.0000 mg | ORAL_TABLET | Freq: Two times a day (BID) | ORAL | 0 refills | Status: DC

## 2023-06-06 NOTE — Discharge Summary (Signed)
Physician Discharge Summary  Kyle Mccarthy ZOX:[id number] DOB: [birth year] DOA: 06/04/2023  PCP: Lianne Moris, PA-C  Admit date: 06/04/2023 Discharge date: 06/06/2023  Admitted From: Home Disposition: Home  Recommendations for Outpatient Follow-up:  Follow up with PCP in 1-2 weeks Please obtain BMP/CBC in one week your next doctors visit.  Vimpat 50 mg twice daily prescribed.  Outpatient follow-up with neurology in about 3-4 weeks.  Advised that he should not be driving until outpatient follow-up with neurology   Discharge Condition: Stable CODE STATUS: Full code Diet recommendation: Regular  Brief/Interim Summary: 67 y.o. male with medical history significant of hypertension, obstructive sleep apnea, obesity, DVT, BPH, insomnia who presents to the emergency department after being brought in by family.  Apparently, patient was sitting on the sofa when he started to right arm jerking movements with a subsequent 45 secs unresponsiveness..  Right-sided drooping was noted.  He complained of daily headaches which has been ongoing for 2 to 3 months and he was following a neurologist for this.  CT of the head negative, neurology was consulted.  MRI was negative.  EEG was unremarkable.  Neurology recommended 50 mg of Vimpat twice daily for upon discharge with outpatient neurology follow-up in 2-4 weeks.  Advised to continue his Xarelto. Patient medically stable for discharge today       Assessment & Plan:  Principal Problem:   TIA (transient ischemic attack) Active Problems:   Essential (primary) hypertension   Obesity   Obstructive sleep apnea   DVT (deep venous thrombosis) (HCC)      TIA r/o Stroke Right-sided weakness/droop - CT of the head and CTA head and neck are negative for any acute pathology or LVO.  MRI brain and EEG negative.  Started on Vimpat 50 mg twice daily per neurology.  Follow-up outpatient - LDL 99, A1c 5.2 - Patient did well with therapy   Essential  hypertension -Permissive hypertension   DVT -On home Xarelto   Obstructive sleep apnea Stable   Obesity (BMI 39.26) Continue diet and lifestyle modification     Discharge Diagnoses:  Principal Problem:   TIA (transient ischemic attack) Active Problems:   Essential (primary) hypertension   Obesity   Obstructive sleep apnea   DVT (deep venous thrombosis) (HCC)      Consultations: Neurology  Subjective: Feels well no complaints.  Wishes to go home Son present at bedside  Discharge Exam: Vitals:   06/06/23 0338 06/06/23 0832  BP: 137/76 (!) 151/80  Pulse: 65 (!) 56  Resp: 20   Temp: 97.8 F (36.6 C) (!) 97.3 F (36.3 C)  SpO2: 95% 96%   Vitals:   06/05/23 2001 06/05/23 2327 06/06/23 0338 06/06/23 0832  BP: (!) 159/86 (!) 151/82 137/76 (!) 151/80  Pulse: 61 60 65 (!) 56  Resp: 18 18 20    Temp: 98.8 F (37.1 C) 98.7 F (37.1 C) 97.8 F (36.6 C) (!) 97.3 F (36.3 C)  TempSrc:  Oral    SpO2: 96% 97% 95% 96%  Weight:      Height:        General: Pt is alert, awake, not in acute distress Cardiovascular: RRR, S1/S2 +, no rubs, no gallops Respiratory: CTA bilaterally, no wheezing, no rhonchi Abdominal: Soft, NT, ND, bowel sounds + Extremities: no edema, no cyanosis  Discharge Instructions  Discharge Instructions     Ambulatory referral to Neurology   Complete by: As directed    An appointment is requested in approximately: 2 weeks   Ambulatory referral  to Physical Therapy   Complete by: As directed    Discharge patient   Complete by: As directed    Discharge disposition: 01-Home or Self Care   Discharge patient date: 06/06/2023      Allergies as of 06/06/2023       Reactions   No Known Allergies         Medication List     STOP taking these medications    ibuprofen 800 MG tablet Commonly known as: ADVIL   simvastatin 20 MG tablet Commonly known as: ZOCOR       TAKE these medications    atorvastatin 80 MG tablet Commonly  known as: LIPITOR Take 80 mg by mouth daily.   cyclobenzaprine 5 MG tablet Commonly known as: FLEXERIL Take 1 tablet (5 mg total) by mouth 2 (two) times daily as needed for muscle spasms.   lacosamide 50 MG Tabs tablet Commonly known as: VIMPAT Take 1 tablet (50 mg total) by mouth 2 (two) times daily.   propranolol ER 80 MG 24 hr capsule Commonly known as: INDERAL LA Take 80 mg by mouth daily.   QUEtiapine 100 MG tablet Commonly known as: SEROQUEL Take 100 mg by mouth at bedtime.   sertraline 100 MG tablet Commonly known as: ZOLOFT Take 1 tablet (100 mg total) by mouth daily.   tamsulosin 0.4 MG Caps capsule Commonly known as: FLOMAX Take 1 capsule (0.4 mg total) by mouth daily after supper.   Xarelto 20 MG Tabs tablet Generic drug: rivaroxaban Take 20 mg by mouth daily.   zolpidem 10 MG tablet Commonly known as: AMBIEN Take 1 tablet (10 mg total) by mouth at bedtime as needed for sleep.        Follow-up Information     Lianne Moris, PA-C Follow up in 1 week(s).   Specialty: Family Medicine Contact information: [address] [place] [address] [phone]         Lianne Moris, PA-C Follow up in 1 week(s).   Specialty: Family Medicine Contact information: [address] [place] [address] [phone]                Allergies  Allergen Reactions   No Known Allergies     You were cared for by a hospitalist during your hospital stay. If you have any questions about your discharge medications or the care you received while you were in the hospital after you are discharged, you can call the unit and asked to speak with the hospitalist on call if the hospitalist that took care of you is not available. Once you are discharged, your primary care physician will handle any further medical issues. Please note that no refills for any discharge medications will be authorized once you are discharged, as it is imperative that you return to your primary care  physician (or establish a relationship with a primary care physician if you do not have one) for your aftercare needs so that they can reassess your need for medications and monitor your lab values.  You were cared for by a hospitalist during your hospital stay. If you have any questions about your discharge medications or the care you received while you were in the hospital after you are discharged, you can call the unit and asked to speak with the hospitalist on call if the hospitalist that took care of you is not available. Once you are discharged, your primary care physician will handle any further medical issues. Please note that NO  REFILLS for any discharge medications will be authorized once you are discharged, as it is imperative that you return to your primary care physician (or establish a relationship with a primary care physician if you do not have one) for your aftercare needs so that they can reassess your need for medications and monitor your lab values.  Please request your Prim.MD to go over all Hospital Tests and Procedure/Radiological results at the follow up, please get all Hospital records sent to your Prim MD by signing hospital release before you go home.  Get CBC, CMP, 2 view Chest X ray checked  by Primary MD during your next visit or SNF MD in 5-7 days ( we routinely change or add medications that can affect your baseline labs and fluid status, therefore we recommend that you get the mentioned basic workup next visit with your PCP, your PCP may decide not to get them or add new tests based on their clinical decision)  On your next visit with your primary care physician please Get Medicines reviewed and adjusted.  If you experience worsening of your admission symptoms, develop shortness of breath, life threatening emergency, suicidal or homicidal thoughts you must seek medical attention immediately by calling 911 or calling your MD immediately  if symptoms less severe.  You Must  read complete instructions/literature along with all the possible adverse reactions/side effects for all the Medicines you take and that have been prescribed to you. Take any new Medicines after you have completely understood and accpet all the possible adverse reactions/side effects.   Do not drive, operate heavy machinery, perform activities at heights, swimming or participation in water activities or provide baby sitting services if your were admitted for syncope or siezures until you have seen by Primary MD or a Neurologist and advised to do so again.  Do not drive when taking Pain medications.   Procedures/Studies: MR BRAIN WO CONTRAST  Result Date: 06/06/2023 CLINICAL DATA:  Acute neurologic deficit EXAM: MRI HEAD WITHOUT CONTRAST TECHNIQUE: Multiplanar, multiecho pulse sequences of the brain and surrounding structures were obtained without intravenous contrast. COMPARISON:  06/12/2022 FINDINGS: Brain: No acute infarct, mass effect or extra-axial collection. No chronic microhemorrhage or siderosis. There is multifocal hyperintense T2-weighted signal within the white matter. Generalized volume loss. The midline structures are normal. Vascular: Normal flow voids. Skull and upper cervical spine: Normal marrow signal. Sinuses/Orbits: Negative. Other: None. IMPRESSION: 1. No acute intracranial abnormality. 2. Findings of chronic microvascular ischemia and volume loss. Electronically Signed   By: Deatra Robinson M.D.   On: 06/06/2023 00:50   ECHOCARDIOGRAM COMPLETE  Result Date: 06/05/2023    ECHOCARDIOGRAM REPORT   Patient Name:   ORLIN PITTA Date of Exam: 06/05/2023 Medical Rec #:  [id number]          Height:       69.0 in Accession #:    [id number]         Weight:       265.9 lb Date of Birth:  [birth year]           BSA:          2.332 m Patient Age:    67 years           BP:           159/73 mmHg Patient Gender: M                  HR:           54  bpm. [facility] Procedure: Cardiac  Doppler and Color Doppler Indications:    TIA G45.9  History:        Patient has no prior history of Echocardiogram examinations.                 TIA; Risk Factors:Hypertension. Obstructive sleep apnea, DVT                 (deep venous thrombosis) (HCC).  Sonographer:    Celesta Gentile RCS Referring Phys: [id number] OLADAPO ADEFESO IMPRESSIONS  1. No definite wall motion abnormalities though endocardium is difficult to see. . Left ventricular ejection fraction, by estimation, is 55 to 60%. The left ventricle has normal function. The left ventricular internal cavity size was mildly dilated. Left ventricular diastolic parameters were normal.  2. Right ventricular systolic function is normal. The right ventricular size is normal.  3. Left atrial size was moderately dilated.  4. Right atrial size was mildly dilated.  5. Trivial mitral valve regurgitation.  6. The aortic valve is normal in structure. Aortic valve regurgitation is trivial.  7. The inferior vena cava is dilated in size with <50% respiratory variability, suggesting right atrial pressure of 15 mmHg. FINDINGS  Left Ventricle: No definite wall motion abnormalities though endocardium is difficult to see. Left ventricular ejection fraction, by estimation, is 55 to 60%. The left ventricle has normal function. The left ventricular internal cavity size was mildly dilated. There is no left ventricular hypertrophy. Left ventricular diastolic parameters were normal. Right Ventricle: The right ventricular size is normal. Right vetricular wall thickness was not assessed. Right ventricular systolic function is normal. Left Atrium: Left atrial size was moderately dilated. Right Atrium: Right atrial size was mildly dilated. Pericardium: There is no evidence of pericardial effusion. Mitral Valve: There is mild thickening of the mitral valve leaflet(s). Trivial mitral valve regurgitation. Tricuspid Valve: The tricuspid valve is normal in structure. Tricuspid valve regurgitation  is trivial. Aortic Valve: The aortic valve is normal in structure. Aortic valve regurgitation is trivial. Pulmonic Valve: The pulmonic valve was normal in structure. Pulmonic valve regurgitation is trivial. Aorta: The aortic root is normal in size and structure. Venous: The inferior vena cava is dilated in size with less than 50% respiratory variability, suggesting right atrial pressure of 15 mmHg. IAS/Shunts: No atrial level shunt detected by color flow Doppler.  LEFT VENTRICLE PLAX 2D LVIDd:         5.60 cm   Diastology LVIDs:         3.50 cm   LV e' medial:    5.44 cm/s LV PW:         1.00 cm   LV E/e' medial:  12.6 LV IVS:        1.00 cm   LV e' lateral:   10.90 cm/s LVOT diam:     2.10 cm   LV E/e' lateral: 6.3 LV SV:         87 LV SV Index:   37 LVOT Area:     3.46 cm  RIGHT VENTRICLE RV S prime:     10.70 cm/s TAPSE (M-mode): 2.7 cm LEFT ATRIUM              Index        RIGHT ATRIUM           Index LA diam:        4.60 cm  1.97 cm/m   RA Area:     25.80 cm  LA Vol (A2C):   141.0 ml 60.47 ml/m  RA Volume:   75.40 ml  32.34 ml/m LA Vol (A4C):   101.0 ml 43.32 ml/m LA Biplane Vol: 125.0 ml 53.61 ml/m  AORTIC VALVE LVOT Vmax:   97.60 cm/s LVOT Vmean:  64.700 cm/s LVOT VTI:    0.252 m  AORTA Ao Root diam: 3.90 cm MITRAL VALVE MV Area (PHT): 2.45 cm    SHUNTS MV Decel Time: 310 msec    Systemic VTI:  0.25 m MV E velocity: 68.47 cm/s  Systemic Diam: 2.10 cm MV A velocity: 74.40 cm/s MV E/A ratio:  0.92 Dietrich Pates MD Electronically signed by Dietrich Pates MD Signature Date/Time: 06/05/2023/5:02:56 PM    Final    EEG adult  Result Date: 06/05/2023 Charlsie Quest, MD     06/05/2023  3:55 PM Patient Name: Gerhart Hirschi MRN: [id number] Epilepsy Attending: Charlsie Quest Referring Physician/Provider: Frankey Shown, DO Date: 06/05/2023 Duration: 23.32 mins Patient history: 67 year old male with transient speech disturbance and right upper extremity weakness as well as right upper extremity jerking for few  minutes. EEG to evaluate for seizure Level of alertness: Awake, asleep AEDs during EEG study: LCM Technical aspects: This EEG study was done with scalp electrodes positioned according to the 10-20 International system of electrode placement. Electrical activity was reviewed with band pass filter of 1-70Hz , sensitivity of 7 uV/mm, display speed of 31mm/sec with a 60Hz  notched filter applied as appropriate. EEG data were recorded continuously and digitally stored.  Video monitoring was available and reviewed as appropriate. Description: The posterior dominant rhythm consists of 8-9 Hz activity of moderate voltage (25-35 uV) seen predominantly in posterior head regions, symmetric and reactive to eye opening and eye closing. Sleep was characterized by vertex waves, maximal frontocentral region. Hyperventilation and photic stimulation were not performed.   IMPRESSION: This study is within normal limits. No seizures or epileptiform discharges were seen throughout the recording. A normal interictal EEG does not exclude the diagnosis of epilepsy. Priyanka Annabelle Harman   CT ANGIO HEAD NECK W WO CM (CODE STROKE)  Result Date: 06/05/2023 CLINICAL DATA:  67 year old male code stroke presentation yesterday. Altered mental status. EXAM: CT ANGIOGRAPHY HEAD AND NECK TECHNIQUE: Multidetector CT imaging of the head and neck was performed using the standard protocol during bolus administration of intravenous contrast. Multiplanar CT image reconstructions and MIPs were obtained to evaluate the vascular anatomy. Carotid stenosis measurements (when applicable) are obtained utilizing NASCET criteria, using the distal internal carotid diameter as the denominator. RADIATION DOSE REDUCTION: This exam was performed according to the departmental dose-optimization program which includes automated exposure control, adjustment of the mA and/or kV according to patient size and/or use of iterative reconstruction technique. CONTRAST:  75mL OMNIPAQUE  IOHEXOL 350 MG/ML SOLN COMPARISON:  Head CT 06/04/2023.  Brain MRI 06/12/2022. FINDINGS: CTA NECK Skeleton: No acute osseous abnormality identified. Cervical spine degeneration. Flowing upper thoracic endplate osteophytes resulting in some thoracic interbody ankylosis. Upper chest: Calcified coronary artery atherosclerosis on series 5, image 201. Otherwise negative visible mediastinum. Visible major airways are patent. Mild lung atelectasis. Small calcified granuloma along the left major fissure. Other neck: No acute finding. Aortic arch: 3 vessel arch.  Mild Calcified aortic atherosclerosis. Right carotid system: Tortuous brachiocephalic artery and right CCA origin with no significant plaque or stenosis. Calcified more so than soft plaque at the right carotid bifurcation, right ICA origin and bulb is mild-to-moderate. No stenosis to the skull base. Left carotid system: Similar mild  tortuosity and atherosclerosis without stenosis. Vertebral arteries: Calcified right subclavian artery origin with no significant stenosis. Subtle calcified plaque at the right vertebral artery origin not causing stenosis. Right vertebral artery is patent and otherwise normal to the skull base. Mild proximal left subclavian artery calcified plaque without stenosis. Mild calcified plaque near the left vertebral artery origin without stenosis. Tortuous left V1 segment. Left vertebral artery is slightly dominant and patent to the skull base with no significant plaque or stenosis. CTA HEAD Posterior circulation: Distal vertebral arteries and vertebrobasilar junction are patent without plaque or stenosis. Normal PICA origins. Patent basilar artery, SCA and PCA origins. Both posterior communicating arteries are present. Bilateral PCA branches are within normal limits. Anterior circulation: Both ICA siphons are patent. Moderate left siphon calcified plaque, most pronounced in the supraclinoid segment on series 8, image 109 with mild-to-moderate  stenosis there. Ectatic left ICA cavernous segment. Contralateral right ICA siphon similar calcified plaque and mild ectasia. No significant right siphon stenosis. Patent carotid termini. Normal MCA and ACA origins. Anterior communicating artery and bilateral ACA branches are within normal limits. Left MCA M1 segment, bifurcation are patent without stenosis. Right MCA M1 segment and bifurcation are patent without stenosis. Bilateral MCA branches are within normal limits. Venous sinuses: Patent. Anatomic variants: Mildly dominant left vertebral artery. Review of the MIP images confirms the above findings IMPRESSION: 1. Negative for large vessel occlusion. 2. Mild to moderate calcified atherosclerosis in the bilateral neck and at the skull base. Up to moderate associated stenosis of the supraclinoid Left ICA. But no other hemodynamically significant stenosis in the head or neck. 3.  Aortic Atherosclerosis (ICD10-I70.0). Electronically Signed   By: Odessa Fleming M.D.   On: 06/05/2023 05:10   CT HEAD CODE STROKE WO CONTRAST  Result Date: 06/04/2023 CLINICAL DATA:  Code stroke. Jerking movements of his right arm. Right facial droop. Confusion. EXAM: CT HEAD WITHOUT CONTRAST TECHNIQUE: Contiguous axial images were obtained from the base of the skull through the vertex without intravenous contrast. RADIATION DOSE REDUCTION: This exam was performed according to the departmental dose-optimization program which includes automated exposure control, adjustment of the mA and/or kV according to patient size and/or use of iterative reconstruction technique. COMPARISON:  MR head without and with contrast 06/12/2022 FINDINGS: Brain: Mild atrophy and moderate white matter changes are similar the prior exam. No acute infarct, hemorrhage, or mass lesion is present. Deep brain nuclei are within normal limits. The ventricles are proportionate to the degree of atrophy. No significant extraaxial fluid collection is present. The brainstem and  cerebellum are within normal limits. Midline structures are within normal limits. Vascular: Atherosclerotic calcifications are present within the cavernous internal carotid arteries bilaterally. No hyperdense vessel is present. Skull: Calvarium is intact. No focal lytic or blastic lesions are present. No significant extracranial soft tissue lesion is present. Sinuses/Orbits: The paranasal sinuses and mastoid air cells are clear. Bilateral lens replacements are noted. Globes and orbits are otherwise unremarkable. IMPRESSION: 1. No acute intracranial abnormality or significant interval change. 2. Stable atrophy and moderate white matter disease. This likely reflects the sequela of chronic microvascular ischemia. These results were called by telephone at the time of interpretation on 06/04/2023 at 6:40 pm to provider Dr. Estell Harpin, who verbally acknowledged these results. Electronically Signed   By: Marin Roberts M.D.   On: 06/04/2023 18:42   Abdomen 1 view (KUB)  Result Date: 05/19/2023 CLINICAL DATA:  Nephrolithiasis. EXAM: ABDOMEN - 1 VIEW COMPARISON:  04/20/2022. FINDINGS: The bowel gas pattern  is normal. No radio-opaque calculi or other significant radiographic abnormality are seen. IMPRESSION: Negative. Electronically Signed   By: Layla Maw M.D.   On: 05/19/2023 22:07     The results of significant diagnostics from this hospitalization (including imaging, microbiology, ancillary and laboratory) are listed below for reference.     Microbiology: No results found for this or any previous visit (from the past 240 hour(s)).   Labs: BNP (last 3 results) No results for input(s): "BNP" in the last 8760 hours. Basic Metabolic Panel: Recent Labs  Lab 06/04/23 1811 06/05/23 0500 06/06/23 0415  NA 141 139 139  K 4.3 4.4 3.5  CL 107 106 106  CO2 26 24 25   GLUCOSE 93 85 95  BUN 15 18 19   CREATININE 1.28* 1.25* 1.12  CALCIUM 9.3 8.6* 8.7*  MG  --  2.1 2.0  PHOS  --  3.4  --    Liver  Function Tests: Recent Labs  Lab 06/04/23 1811 06/05/23 0500  AST 16 25  ALT 13 13  ALKPHOS 77 69  BILITOT 0.9 1.5*  PROT 7.2 6.3*  ALBUMIN 4.0 3.5   No results for input(s): "LIPASE", "AMYLASE" in the last 168 hours. No results for input(s): "AMMONIA" in the last 168 hours. CBC: Recent Labs  Lab 06/04/23 1811 06/05/23 0500 06/06/23 0415  WBC 5.8 5.1 5.4  NEUTROABS 3.6  --   --   HGB 13.2 12.7* 12.7*  HCT 39.9 39.2 37.8*  MCV 97.1 98.5 95.5  PLT 140* 131* 128*   Cardiac Enzymes: No results for input(s): "CKTOTAL", "CKMB", "CKMBINDEX", "TROPONINI" in the last 168 hours. BNP: Invalid input(s): "POCBNP" CBG: No results for input(s): "GLUCAP" in the last 168 hours. D-Dimer No results for input(s): "DDIMER" in the last 72 hours. Hgb A1c Recent Labs    06/05/23 0500  HGBA1C 5.2   Lipid Profile Recent Labs    06/05/23 0751  CHOL 198  HDL 66  LDLCALC 99  TRIG 163*  CHOLHDL 3.0   Thyroid function studies No results for input(s): "TSH", "T4TOTAL", "T3FREE", "THYROIDAB" in the last 72 hours.  Invalid input(s): "FREET3" Anemia work up No results for input(s): "VITAMINB12", "FOLATE", "FERRITIN", "TIBC", "IRON", "RETICCTPCT" in the last 72 hours. Urinalysis    Component Value Date/Time   COLORURINE YELLOW 06/04/2023 1838   APPEARANCEUR CLEAR 06/04/2023 1838   APPEARANCEUR Clear 05/17/2023 1316   LABSPEC 1.017 06/04/2023 1838   PHURINE 5.0 06/04/2023 1838   GLUCOSEU NEGATIVE 06/04/2023 1838   HGBUR NEGATIVE 06/04/2023 1838   BILIRUBINUR NEGATIVE 06/04/2023 1838   BILIRUBINUR Negative 05/17/2023 1316   KETONESUR NEGATIVE 06/04/2023 1838   PROTEINUR NEGATIVE 06/04/2023 1838   NITRITE NEGATIVE 06/04/2023 1838   LEUKOCYTESUR NEGATIVE 06/04/2023 1838   Sepsis Labs Recent Labs  Lab 06/04/23 1811 06/05/23 0500 06/06/23 0415  WBC 5.8 5.1 5.4   Microbiology No results found for this or any previous visit (from the past 240 hour(s)).   Time coordinating  discharge:  I have spent 35 minutes face to face with the patient and on the ward discussing the patients care, assessment, plan and disposition with other care givers. >50% of the time was devoted counseling the patient about the risks and benefits of treatment/Discharge disposition and coordinating care.   SIGNED:   Dimple Nanas, MD  [facility] 06/06/2023, 12:42 PM   If 7PM-7AM, please contact night-coverage

## 2023-06-06 NOTE — Progress Notes (Signed)
Patient slept on and off this shift, only waking for Stroke assessment and vitals to be done. No complaints of pain. Patient did get down to 39 on telemetry but came right back up to 59.  Continued to monitor patient.

## 2023-06-06 NOTE — Progress Notes (Signed)
Patient changed his mind and went to [facility] in [place].  Another prescription sent electronically. Also spoke with the pharmacist to confirm the prescription.   Stephania Fragmin MD

## 2023-06-06 NOTE — Progress Notes (Signed)
Nsg Discharge Note  Admit Date:  06/04/2023 Discharge date: 06/06/2023   Printes Kyle Mccarthy to be D/C'd Home per MD order.  AVS completed.   Patient/caregiver able to verbalize understanding.  Discharge Medication: Allergies as of 06/06/2023       Reactions   No Known Allergies         Medication List     STOP taking these medications    ibuprofen 800 MG tablet Commonly known as: ADVIL   simvastatin 20 MG tablet Commonly known as: ZOCOR       TAKE these medications    atorvastatin 80 MG tablet Commonly known as: LIPITOR Take 80 mg by mouth daily.   cyclobenzaprine 5 MG tablet Commonly known as: FLEXERIL Take 1 tablet (5 mg total) by mouth 2 (two) times daily as needed for muscle spasms.   lacosamide 50 MG Tabs tablet Commonly known as: VIMPAT Take 1 tablet (50 mg total) by mouth 2 (two) times daily.   propranolol ER 80 MG 24 hr capsule Commonly known as: INDERAL LA Take 80 mg by mouth daily.   QUEtiapine 100 MG tablet Commonly known as: SEROQUEL Take 100 mg by mouth at bedtime.   sertraline 100 MG tablet Commonly known as: ZOLOFT Take 1 tablet (100 mg total) by mouth daily.   tamsulosin 0.4 MG Caps capsule Commonly known as: FLOMAX Take 1 capsule (0.4 mg total) by mouth daily after supper.   Xarelto 20 MG Tabs tablet Generic drug: rivaroxaban Take 20 mg by mouth daily.   zolpidem 10 MG tablet Commonly known as: AMBIEN Take 1 tablet (10 mg total) by mouth at bedtime as needed for sleep.        Discharge Assessment: Vitals:   06/06/23 0338 06/06/23 0832  BP: 137/76 (!) 151/80  Pulse: 65 (!) 56  Resp: 20   Temp: 97.8 F (36.6 C) (!) 97.3 F (36.3 C)  SpO2: 95% 96%   Skin clean, dry and intact without evidence of skin break down, no evidence of skin tears noted. IV catheter discontinued intact. Site without signs and symptoms of complications - no redness or edema noted at insertion site, patient denies c/o pain - only slight tenderness at  site.  Dressing with slight pressure applied.  D/c Instructions-Education: Discharge instructions given to patient/family with verbalized understanding. D/c education completed with patient/family including follow up instructions, medication list, d/c activities limitations if indicated, with other d/c instructions as indicated by MD - patient able to verbalize understanding, all questions fully answered. Patient instructed to return to ED, call 911, or call MD for any changes in condition.  Patient escorted via WC, and D/C home via private auto.  Laurena Spies, RN 06/06/2023 9:40 AM

## 2023-06-06 NOTE — Progress Notes (Signed)
Requesting Vimpat 50mg  po bid to be sent to [facility] in [place].

## 2023-06-27 ENCOUNTER — Ambulatory Visit (INDEPENDENT_AMBULATORY_CARE_PROVIDER_SITE_OTHER): Payer: 59 | Admitting: Primary Care

## 2023-06-27 ENCOUNTER — Encounter: Payer: Self-pay | Admitting: Primary Care

## 2023-06-27 VITALS — BP 136/72 | HR 57 | Temp 97.7°F | Ht 69.0 in | Wt 268.0 lb

## 2023-06-27 DIAGNOSIS — G4733 Obstructive sleep apnea (adult) (pediatric): Secondary | ICD-10-CM | POA: Diagnosis not present

## 2023-06-27 NOTE — Progress Notes (Signed)
@Patient  ID: Kyle Mccarthy, male    DOB: Mar 14, 1956, 67 y.o.   MRN: [id number]  Chief Complaint  Patient presents with   Follow-up    CPAP compliance review    Referring provider: Lianne Moris, PA-C  HPI: 67 y.e.a.r old male, never smoked. PMH significant for hypertension, obstructive sleep apnea, insomnia, cognitive impairment, depression, obesity, DVT on Xarelto.  Previous [facility] pulmonary encounter:  05/10/2023 Patient presents today for sleep consult. He has been seeing neurology for memory loss, last seen in April. He has been having daily headaches. Hx sleep apnea, he had a home sleep study with his primary care in 2023. Currently on CPAP. Feels CPAP is not working properly, settings have not been adjusted. He continues to have fatigue symptoms. He lives alone and is unsure if he snores. He takes ambien nightly for insomnia. He gets on average 6.5 or 7 hours of sleep a night. Patient used to work as a [employer] architect, he is now on short term disability. He likes to stay busy. He went from working 60 hours a week to not work. His primary took him out of work for 1 year, may look into retiring after that. DME company is [employer] out of [employer].   Patient had a sleep study on 02/04/2021 that showed severe obstructive sleep apnea, AHI 60.5 an hour.  Patient spent 219 minutes with an oxygen level less than 90%.  He was started on auto CPAP.  Sleep questionnaire Symptoms-  Duration and quality of his sleep fluctuates  Prior sleep study- February 17th 2022, results are not in chart (requesting from [facility] in [place] -Dr. Rosana Hoes )  Bedtime- 10-11pm Time to fall asleep- 5 mins  Nocturnal awakenings- twice  Out of bed/start of day- 7am  Weight changes- 25 lbs  Do you operate heavy machinery- yes  Do you currently wear CPAP- yes  Do you current wear oxygen- no [facility]- 17  69/08/2023- Interim hx  Patient presents today for follow-up OSA.  Patient was seen for sleep  consult in May due to fatigue symptoms.  He has a history of sleep apnea is currently on CPAP.  Since his last overview we were able to receive compliance report from his DME company. He is 69% complaint with CPAP use between July 2023-July 2024. with CPAP use between July 2023-July 2024. Current pressure 6cm h20 with residual AHI 1.8/hour. He takes Seroquel and Ambien 10mg  at bedtime. He has no trouble falling or staying asleep. He does not experience any morning grogginess. He is getting between 5-7 hours of sleep a night. He remains very tired during the day, feels a lot of it has to do with his weight. He tells me he has never weighed this much before. He was hospitalizaed for TIA recently, seeing [facility] tomorrow. We discuss medication to help with hypersomnia. Stimulants can increase blood pressure and heart rate and would need to be used with caution. Patient would like to avoid medication use at this time.  Compliance download Resvent 06/26/22-06/25/23 Usage 277/365; 267 (73%) Average usage 5.2 hours Pressure 6cm h20  AHI 1.8  Allergies  Allergen Reactions   No Known Allergies     Immunization History  Administered Date(s) Administered   PFIZER(Purple Top)SARS-COV-2 Vaccination 02/22/2020, 03/14/2020, 10/12/2020   Pneumococcal Polysaccharide-23 08/22/2019    Past Medical History:  Diagnosis Date   Anxiety    Depression    Hx of blood clots    left leg years ago   Hypertension    Sleep apnea  Tobacco History: Social History   Tobacco Use  Smoking Status Never  Smokeless Tobacco Never   Counseling given: Not Answered   Outpatient Medications Prior to Visit  Medication Sig Dispense Refill   atorvastatin (LIPITOR) 80 MG tablet Take 80 mg by mouth daily.     cyclobenzaprine (FLEXERIL) 5 MG tablet Take 1 tablet (5 mg total) by mouth 2 (two) times daily as needed for muscle spasms. 30 tablet 1   lacosamide (VIMPAT) 50 MG TABS tablet Take 50 mg by mouth 2 (two) times daily. Patient only taking one tablet  once a day     propranolol ER (INDERAL LA) 80 MG 24 hr capsule Take 80 mg by mouth daily.     QUEtiapine (SEROQUEL) 100 MG tablet Take 100 mg by mouth at bedtime.     sertraline (ZOLOFT) 100 MG tablet Take 1 tablet (100 mg total) by mouth daily. 90 tablet 1   tamsulosin (FLOMAX) 0.4 MG CAPS capsule Take 1 capsule (0.4 mg total) by mouth daily after supper. 30 capsule 11   XARELTO 20 MG TABS tablet Take 20 mg by mouth daily.     zolpidem (AMBIEN) 10 MG tablet Take 1 tablet (10 mg total) by mouth at bedtime as needed for sleep. 30 tablet 0   Facility-Administered Medications Prior to Visit  Medication Dose Route Frequency Provider Last Rate Last Admin   lacosamide (VIMPAT) tablet 50 mg  50 mg Oral BID Amin, Loura Halt, MD          Review of Systems  Review of Systems  Constitutional:  Positive for fatigue.  HENT:  Positive for drooling.   Respiratory: Negative.    Cardiovascular: Negative.   Psychiatric/Behavioral:  Negative for sleep disturbance.      Physical Exam  BP 136/72 (BP Location: Left Arm, Patient Position: Sitting, Cuff Size: Large)   Pulse (!) 57   Temp 97.7 F (36.5 C) (Oral)   Ht 5\' 9"  (1.753 m)   Wt 268 lb (121.6 kg)   SpO2 96%   BMI 39.58 kg/m  Physical Exam Constitutional:      Appearance: Normal appearance. He is obese.  HENT:     Head: Normocephalic and atraumatic.     Mouth/Throat:     Mouth: Mucous membranes are moist.     Pharynx: Oropharynx is clear.  Cardiovascular:     Rate and Rhythm: Normal rate and regular rhythm.  Pulmonary:     Effort: Pulmonary effort is normal.     Breath sounds: Normal breath sounds.  Musculoskeletal:     Cervical back: Normal range of motion.  Skin:    General: Skin is warm and dry.  Neurological:     General: No focal deficit present.     Mental Status: He is alert and oriented to person, place, and time. Mental status is at baseline.  Psychiatric:        Mood and Affect: Mood normal.        Behavior:  Behavior normal.        Thought Content: Thought content normal.        Judgment: Judgment normal.      Lab Results:  CBC    Component Value Date/Time   WBC 5.4 06/06/2023 0415   RBC 3.96 (L) 06/06/2023 0415   HGB 12.7 (L) 06/06/2023 0415   HGB 13.0 04/11/2023 1534   HCT 37.8 (L) 06/06/2023 0415   HCT 39.3 04/11/2023 1534   PLT 128 (L) 06/06/2023 0415   MCV  95.5 06/06/2023 0415   MCV 94 04/11/2023 1534   MCH 32.1 06/06/2023 0415   MCHC 33.6 06/06/2023 0415   RDW 12.8 06/06/2023 0415   RDW 12.2 04/11/2023 1534   LYMPHSABS 1.5 06/04/2023 1811   LYMPHSABS 1.6 04/11/2023 1534   MONOABS 0.4 06/04/2023 1811   EOSABS 0.1 06/04/2023 1811   EOSABS 0.1 04/11/2023 1534   BASOSABS 0.0 06/04/2023 1811   BASOSABS 0.0 04/11/2023 1534    BMET    Component Value Date/Time   NA 139 06/06/2023 0415   NA 143 04/11/2023 1534   K 3.5 06/06/2023 0415   CL 106 06/06/2023 0415   CO2 25 06/06/2023 0415   GLUCOSE 95 06/06/2023 0415   BUN 19 06/06/2023 0415   BUN 14 04/11/2023 1534   CREATININE 1.12 06/06/2023 0415   CALCIUM 8.7 (L) 06/06/2023 0415   GFRNONAA >60 06/06/2023 0415   GFRAA >60 08/22/2019 0629    BNP No results found for: "BNP"  ProBNP No results found for: "PROBNP"  Imaging: MR BRAIN WO CONTRAST  Result Date: 06/06/2023 CLINICAL DATA:  Acute neurologic deficit EXAM: MRI HEAD WITHOUT CONTRAST TECHNIQUE: Multiplanar, multiecho pulse sequences of the brain and surrounding structures were obtained without intravenous contrast. COMPARISON:  06/12/2022 FINDINGS: Brain: No acute infarct, mass effect or extra-axial collection. No chronic microhemorrhage or siderosis. There is multifocal hyperintense T2-weighted signal within the white matter. Generalized volume loss. The midline structures are normal. Vascular: Normal flow voids. Skull and upper cervical spine: Normal marrow signal. Sinuses/Orbits: Negative. Other: None. IMPRESSION: 1. No acute intracranial abnormality. 2.  Findings of chronic microvascular ischemia and volume loss. Electronically Signed   By: Deatra Robinson M.D.   On: 06/06/2023 00:50   ECHOCARDIOGRAM COMPLETE  Result Date: 06/05/2023    ECHOCARDIOGRAM REPORT   Patient Name:   KAMAL JURGENS Date of Exam: 06/05/2023 Medical Rec #:  [id number]          Height:       69.0 in Accession #:    [id number]         Weight:       265.9 lb Date of Birth:  [birth year]           BSA:          2.332 m Patient Age:    67 years           BP:           159/73 mmHg Patient Gender: M                  HR:           54 bpm. [facility] Procedure: Cardiac Doppler and Color Doppler Indications:    TIA G45.9  History:        Patient has no prior history of Echocardiogram examinations.                 TIA; Risk Factors:Hypertension. Obstructive sleep apnea, DVT                 (deep venous thrombosis) (HCC).  Sonographer:    Celesta Gentile RCS Referring Phys: [id number] OLADAPO ADEFESO IMPRESSIONS  1. No definite wall motion abnormalities though endocardium is difficult to see. . Left ventricular ejection fraction, by estimation, is 55 to 60%. The left ventricle has normal function. The left ventricular internal cavity size was mildly dilated. Left ventricular diastolic parameters were normal.  2. Right ventricular systolic function is normal. The  right ventricular size is normal.  3. Left atrial size was moderately dilated.  4. Right atrial size was mildly dilated.  5. Trivial mitral valve regurgitation.  6. The aortic valve is normal in structure. Aortic valve regurgitation is trivial.  7. The inferior vena cava is dilated in size with <50% respiratory variability, suggesting right atrial pressure of 15 mmHg. FINDINGS  Left Ventricle: No definite wall motion abnormalities though endocardium is difficult to see. Left ventricular ejection fraction, by estimation, is 55 to 60%. The left ventricle has normal function. The left ventricular internal cavity size was mildly dilated.  There is no left ventricular hypertrophy. Left ventricular diastolic parameters were normal. Right Ventricle: The right ventricular size is normal. Right vetricular wall thickness was not assessed. Right ventricular systolic function is normal. Left Atrium: Left atrial size was moderately dilated. Right Atrium: Right atrial size was mildly dilated. Pericardium: There is no evidence of pericardial effusion. Mitral Valve: There is mild thickening of the mitral valve leaflet(s). Trivial mitral valve regurgitation. Tricuspid Valve: The tricuspid valve is normal in structure. Tricuspid valve regurgitation is trivial. Aortic Valve: The aortic valve is normal in structure. Aortic valve regurgitation is trivial. Pulmonic Valve: The pulmonic valve was normal in structure. Pulmonic valve regurgitation is trivial. Aorta: The aortic root is normal in size and structure. Venous: The inferior vena cava is dilated in size with less than 50% respiratory variability, suggesting right atrial pressure of 15 mmHg. IAS/Shunts: No atrial level shunt detected by color flow Doppler.  LEFT VENTRICLE PLAX 2D LVIDd:         5.60 cm   Diastology LVIDs:         3.50 cm   LV e' medial:    5.44 cm/s LV PW:         1.00 cm   LV E/e' medial:  12.6 LV IVS:        1.00 cm   LV e' lateral:   10.90 cm/s LVOT diam:     2.10 cm   LV E/e' lateral: 6.3 LV SV:         87 LV SV Index:   37 LVOT Area:     3.46 cm  RIGHT VENTRICLE RV S prime:     10.70 cm/s TAPSE (M-mode): 2.7 cm LEFT ATRIUM              Index        RIGHT ATRIUM           Index LA diam:        4.60 cm  1.97 cm/m   RA Area:     25.80 cm LA Vol (A2C):   141.0 ml 60.47 ml/m  RA Volume:   75.40 ml  32.34 ml/m LA Vol (A4C):   101.0 ml 43.32 ml/m LA Biplane Vol: 125.0 ml 53.61 ml/m  AORTIC VALVE LVOT Vmax:   97.60 cm/s LVOT Vmean:  64.700 cm/s LVOT VTI:    0.252 m  AORTA Ao Root diam: 3.90 cm MITRAL VALVE MV Area (PHT): 2.45 cm    SHUNTS MV Decel Time: 310 msec    Systemic VTI:  0.25 m MV E  velocity: 68.47 cm/s  Systemic Diam: 2.10 cm MV A velocity: 74.40 cm/s MV E/A ratio:  0.92 Dietrich Pates MD Electronically signed by Dietrich Pates MD Signature Date/Time: 06/05/2023/5:02:56 PM    Final    EEG adult  Result Date: 06/05/2023 Charlsie Quest, MD     06/05/2023  3:55 PM Patient  Name: Vanderbilt Ranieri MRN: [id number] Epilepsy Attending: Charlsie Quest Referring Physician/Provider: Frankey Shown, DO Date: 06/05/2023 Duration: 23.32 mins Patient history: 67 y.e.a.r old male with transient speech disturbance and right upper extremity weakness as well as right upper extremity jerking for few minutes. EEG to evaluate for seizure Level of alertness: Awake, asleep AEDs during EEG study: LCM Technical aspects: This EEG study was done with scalp electrodes positioned according to the 10-20 International system of electrode placement. Electrical activity was reviewed with band pass filter of 1-70Hz , sensitivity of 7 uV/mm, display speed of 87mm/sec with a 60Hz  notched filter applied as appropriate. EEG data were recorded continuously and digitally stored.  Video monitoring was available and reviewed as appropriate. Description: The posterior dominant rhythm consists of 8-9 Hz activity of moderate voltage (25-35 uV) seen predominantly in posterior head regions, symmetric and reactive to eye opening and eye closing. Sleep was characterized by vertex waves, maximal frontocentral region. Hyperventilation and photic stimulation were not performed.   IMPRESSION: This study is within normal limits. No seizures or epileptiform discharges were seen throughout the recording. A normal interictal EEG does not exclude the diagnosis of epilepsy. Priyanka Annabelle Harman   CT ANGIO HEAD NECK W WO CM (CODE STROKE)  Result Date: 06/05/2023 CLINICAL DATA:  67 y.e.a.r old male code stroke presentation yesterday. Altered mental status. EXAM: CT ANGIOGRAPHY HEAD AND NECK TECHNIQUE: Multidetector CT imaging of the head and neck was performed  using the standard protocol during bolus administration of intravenous contrast. Multiplanar CT image reconstructions and MIPs were obtained to evaluate the vascular anatomy. Carotid stenosis measurements (when applicable) are obtained utilizing NASCET criteria, using the distal internal carotid diameter as the denominator. RADIATION DOSE REDUCTION: This exam was performed according to the departmental dose-optimization program which includes automated exposure control, adjustment of the mA and/or kV according to patient size and/or use of iterative reconstruction technique. CONTRAST:  75mL OMNIPAQUE IOHEXOL 350 MG/ML SOLN COMPARISON:  Head CT 06/04/2023.  Brain MRI 06/12/2022. FINDINGS: CTA NECK Skeleton: No acute osseous abnormality identified. Cervical spine degeneration. Flowing upper thoracic endplate osteophytes resulting in some thoracic interbody ankylosis. Upper chest: Calcified coronary artery atherosclerosis on series 5, image 201. Otherwise negative visible mediastinum. Visible major airways are patent. Mild lung atelectasis. Small calcified granuloma along the left major fissure. Other neck: No acute finding. Aortic arch: 3 vessel arch.  Mild Calcified aortic atherosclerosis. Right carotid system: Tortuous brachiocephalic artery and right CCA origin with no significant plaque or stenosis. Calcified more so than soft plaque at the right carotid bifurcation, right ICA origin and bulb is mild-to-moderate. No stenosis to the skull base. Left carotid system: Similar mild tortuosity and atherosclerosis without stenosis. Vertebral arteries: Calcified right subclavian artery origin with no significant stenosis. Subtle calcified plaque at the right vertebral artery origin not causing stenosis. Right vertebral artery is patent and otherwise normal to the skull base. Mild proximal left subclavian artery calcified plaque without stenosis. Mild calcified plaque near the left vertebral artery origin without stenosis.  Tortuous left V1 segment. Left vertebral artery is slightly dominant and patent to the skull base with no significant plaque or stenosis. CTA HEAD Posterior circulation: Distal vertebral arteries and vertebrobasilar junction are patent without plaque or stenosis. Normal PICA origins. Patent basilar artery, SCA and PCA origins. Both posterior communicating arteries are present. Bilateral PCA branches are within normal limits. Anterior circulation: Both ICA siphons are patent. Moderate left siphon calcified plaque, most pronounced in the supraclinoid segment on series 8, image 109 with mild-to-moderate  stenosis there. Ectatic left ICA cavernous segment. Contralateral right ICA siphon similar calcified plaque and mild ectasia. No significant right siphon stenosis. Patent carotid termini. Normal MCA and ACA origins. Anterior communicating artery and bilateral ACA branches are within normal limits. Left MCA M1 segment, bifurcation are patent without stenosis. Right MCA M1 segment and bifurcation are patent without stenosis. Bilateral MCA branches are within normal limits. Venous sinuses: Patent. Anatomic variants: Mildly dominant left vertebral artery. Review of the MIP images confirms the above findings IMPRESSION: 1. Negative for large vessel occlusion. 2. Mild to moderate calcified atherosclerosis in the bilateral neck and at the skull base. Up to moderate associated stenosis of the supraclinoid Left ICA. But no other hemodynamically significant stenosis in the head or neck. 3.  Aortic Atherosclerosis (ICD10-I70.0). Electronically Signed   By: Odessa Fleming M.D.   On: 06/05/2023 05:10   CT HEAD CODE STROKE WO CONTRAST  Result Date: 06/04/2023 CLINICAL DATA:  Code stroke. Jerking movements of his right arm. Right facial droop. Confusion. EXAM: CT HEAD WITHOUT CONTRAST TECHNIQUE: Contiguous axial images were obtained from the base of the skull through the vertex without intravenous contrast. RADIATION DOSE REDUCTION: This  exam was performed according to the departmental dose-optimization program which includes automated exposure control, adjustment of the mA and/or kV according to patient size and/or use of iterative reconstruction technique. COMPARISON:  MR head without and with contrast 06/12/2022 FINDINGS: Brain: Mild atrophy and moderate white matter changes are similar the prior exam. No acute infarct, hemorrhage, or mass lesion is present. Deep brain nuclei are within normal limits. The ventricles are proportionate to the degree of atrophy. No significant extraaxial fluid collection is present. The brainstem and cerebellum are within normal limits. Midline structures are within normal limits. Vascular: Atherosclerotic calcifications are present within the cavernous internal carotid arteries bilaterally. No hyperdense vessel is present. Skull: Calvarium is intact. No focal lytic or blastic lesions are present. No significant extracranial soft tissue lesion is present. Sinuses/Orbits: The paranasal sinuses and mastoid air cells are clear. Bilateral lens replacements are noted. Globes and orbits are otherwise unremarkable. IMPRESSION: 1. No acute intracranial abnormality or significant interval change. 2. Stable atrophy and moderate white matter disease. This likely reflects the sequela of chronic microvascular ischemia. These results were called by telephone at the time of interpretation on 06/04/2023 at 6:40 pm to provider Dr. Estell Harpin, who verbally acknowledged these results. Electronically Signed   By: Marin Roberts M.D.   On: 06/04/2023 18:42     Assessment & Plan:   Obstructive sleep apnea - Sleep apnea is well-controlled on CPAP. He is 73% compliant with use >4 hours over the last year. He is having no significant residual apneas while using CPAP. Current pressure 6cm h20; AHI 1.8/hour. We will cancel CPAP titration study. No changes recommended. Continue to encourage patient wear CPAP nightly 4 to 6 hours or  longer. Advised he work on weight loss efforts. We reviewed medications used for hypersomnia in patients with OSA but patient would like to avoid at this time. He will let us know if she wants to reconsider this. FU in 6 months or sooner if needed.   Glenford Bayley, NP 06/27/2023

## 2023-06-27 NOTE — Progress Notes (Signed)
Reviewed and agree with assessment/plan.   Coralyn Helling, MD [facility] Pulmonary/Critical Care 06/27/2023, 1:30 PM Pager:  [phone]

## 2023-06-27 NOTE — Patient Instructions (Addendum)
- Sleep apnea is well-controlled on current CPAP pressure setting 6 cm H2O - We will cancel CPAP titration study - No changes recommended - Continue to wear CPAP nightly 4 to 6 hours or longer - Work on weight loss efforts, please discuss weight loss medication (such as injectables) with your primary care physician - Also discuss referral to rheumatology for workup of chronic fatigue syndrome - If you would like to discuss stimulant medications for hypersomnia return to office, would use caution with these medications as they can raise your blood pressure and heart rate  Follow-up - 6 months with Beth NP   Calorie Counting for Weight Loss Calories are units of energy. Your body needs a certain number of calories from food to keep going throughout the day. When you eat or drink more calories than your body needs, your body stores the extra calories mostly as fat. When you eat or drink fewer calories than your body needs, your body burns fat to get the energy it needs. Calorie counting means keeping track of how many calories you eat and drink each day. Calorie counting can be helpful if you need to lose weight. If you eat fewer calories than your body needs, you should lose weight. Ask your health care provider what a healthy weight is for you. For calorie counting to work, you will need to eat the right number of calories each day to lose a healthy amount of weight per week. A dietitian can help you figure out how many calories you need in a day and will suggest ways to reach your calorie goal. A healthy amount of weight to lose each week is usually 1-2 lb (0.5-0.9 kg). This usually means that your daily calorie intake should be reduced by 500-750 calories. Eating 1,200-1,500 calories a day can help most women lose weight. Eating 1,500-1,800 calories a day can help most men lose weight. What do I need to know about calorie counting? Work with your health care provider or dietitian to determine how  many calories you should get each day. To meet your daily calorie goal, you will need to: Find out how many calories are in each food that you would like to eat. Try to do this before you eat. Decide how much of the food you plan to eat. Keep a food log. Do this by writing down what you ate and how many calories it had. To successfully lose weight, it is important to balance calorie counting with a healthy lifestyle that includes regular activity. Where do I find calorie information?  The number of calories in a food can be found on a Nutrition Facts label. If a food does not have a Nutrition Facts label, try to look up the calories online or ask your dietitian for help. Remember that calories are listed per serving. If you choose to have more than one serving of a food, you will have to multiply the calories per serving by the number of servings you plan to eat. For example, the label on a package of bread might say that a serving size is 1 slice and that there are 90 calories in a serving. If you eat 1 slice, you will have eaten 90 calories. If you eat 2 slices, you will have eaten 180 calories. How do I keep a food log? After each time that you eat, record the following in your food log as soon as possible: What you ate. Be sure to include toppings, sauces, and other extras  on the food. How much you ate. This can be measured in cups, ounces, or number of items. How many calories were in each food and drink. The total number of calories in the food you ate. Keep your food log near you, such as in a pocket-sized notebook or on an app or website on your mobile phone. Some programs will calculate calories for you and show you how many calories you have left to meet your daily goal. What are some portion-control tips? Know how many calories are in a serving. This will help you know how many servings you can have of a certain food. Use a measuring cup to measure serving sizes. You could also try  weighing out portions on a kitchen scale. With time, you will be able to estimate serving sizes for some foods. Take time to put servings of different foods on your favorite plates or in your favorite bowls and cups so you know what a serving looks like. Try not to eat straight from a food's packaging, such as from a bag or box. Eating straight from the package makes it hard to see how much you are eating and can lead to overeating. Put the amount you would like to eat in a cup or on a plate to make sure you are eating the right portion. Use smaller plates, glasses, and bowls for smaller portions and to prevent overeating. Try not to multitask. For example, avoid watching TV or using your computer while eating. If it is time to eat, sit down at a table and enjoy your food. This will help you recognize when you are full. It will also help you be more mindful of what and how much you are eating. What are tips for following this plan? Reading food labels Check the calorie count compared with the serving size. The serving size may be smaller than what you are used to eating. Check the source of the calories. Try to choose foods that are high in protein, fiber, and vitamins, and low in saturated fat, trans fat, and sodium. Shopping Read nutrition labels while you shop. This will help you make healthy decisions about which foods to buy. Pay attention to nutrition labels for low-fat or fat-free foods. These foods sometimes have the same number of calories or more calories than the full-fat versions. They also often have added sugar, starch, or salt to make up for flavor that was removed with the fat. Make a grocery list of lower-calorie foods and stick to it. Cooking Try to cook your favorite foods in a healthier way. For example, try baking instead of frying. Use low-fat dairy products. Meal planning Use more fruits and vegetables. One-half of your plate should be fruits and vegetables. Include lean  proteins, such as chicken, [place], and fish. Lifestyle Each week, aim to do one of the following: 150 minutes of moderate exercise, such as walking. 75 minutes of vigorous exercise, such as running. General information Know how many calories are in the foods you eat most often. This will help you calculate calorie counts faster. Find a way of tracking calories that works for you. Get creative. Try different apps or programs if writing down calories does not work for you. What foods should I eat?  Eat nutritious foods. It is better to have a nutritious, high-calorie food, such as an avocado, than a food with few nutrients, such as a bag of potato chips. Use your calories on foods and drinks that will fill you up  and will not leave you hungry soon after eating. Examples of foods that fill you up are nuts and nut butters, vegetables, lean proteins, and high-fiber foods such as whole grains. High-fiber foods are foods with more than 5 g of fiber per serving. Pay attention to calories in drinks. Low-calorie drinks include water and unsweetened drinks. The items listed above may not be a complete list of foods and beverages you can eat. Contact a dietitian for more information. What foods should I limit? Limit foods or drinks that are not good sources of vitamins, minerals, or protein or that are high in unhealthy fats. These include: Candy. Other sweets. Sodas, specialty coffee drinks, alcohol, and juice. The items listed above may not be a complete list of foods and beverages you should avoid. Contact a dietitian for more information. How do I count calories when eating out? Pay attention to portions. Often, portions are much larger when eating out. Try these tips to keep portions smaller: Consider sharing a meal instead of getting your own. If you get your own meal, eat only half of it. Before you start eating, ask for a container and put half of your meal into it. When available, consider  ordering smaller portions from the menu instead of full portions. Pay attention to your food and drink choices. Knowing the way food is cooked and what is included with the meal can help you eat fewer calories. If calories are listed on the menu, choose the lower-calorie options. Choose dishes that include vegetables, fruits, whole grains, low-fat dairy products, and lean proteins. Choose items that are boiled, broiled, grilled, or steamed. Avoid items that are buttered, battered, fried, or served with cream sauce. Items labeled as crispy are usually fried, unless stated otherwise. Choose water, low-fat milk, unsweetened iced tea, or other drinks without added sugar. If you want an alcoholic beverage, choose a lower-calorie option, such as a glass of wine or light beer. Ask for dressings, sauces, and syrups on the side. These are usually high in calories, so you should limit the amount you eat. If you want a salad, choose a garden salad and ask for grilled meats. Avoid extra toppings such as bacon, cheese, or fried items. Ask for the dressing on the side, or ask for olive oil and vinegar or lemon to use as dressing. Estimate how many servings of a food you are given. Knowing serving sizes will help you be aware of how much food you are eating at restaurants. Where to find more information Centers for Disease Control and Prevention: FootballExhibition.com.br [place] Department of Agriculture: WrestlingReporter.dk Summary Calorie counting means keeping track of how many calories you eat and drink each day. If you eat fewer calories than your body needs, you should lose weight. A healthy amount of weight to lose per week is usually 1-2 lb (0.5-0.9 kg). This usually means reducing your daily calorie intake by 500-750 calories. The number of calories in a food can be found on a Nutrition Facts label. If a food does not have a Nutrition Facts label, try to look up the calories online or ask your dietitian for help. Use smaller  plates, glasses, and bowls for smaller portions and to prevent overeating. Use your calories on foods and drinks that will fill you up and not leave you hungry shortly after a meal. This information is not intended to replace advice given to you by your health care provider. Make sure you discuss any questions you have with your health care  provider. Document Revised: 01/16/2020 Document Reviewed: 01/16/2020 Elsevier Patient Education  2023 [employer]   CPAP and BIPAP Information CPAP and BIPAP are methods that use air pressure to keep your airways open and to help you breathe well. CPAP and BIPAP use different amounts of pressure. Your health care provider will tell you whether CPAP or BIPAP would be more helpful for you. CPAP stands for "continuous positive airway pressure." With CPAP, the amount of pressure stays the same while you breathe in (inhale) and out (exhale). BIPAP stands for "bi-level positive airway pressure." With BIPAP, the amount of pressure will be higher when you inhale and lower when you exhale. This allows you to take larger breaths. CPAP or BIPAP may be used in the hospital, or your health care provider may want you to use it at home. You may need to have a sleep study before your health care provider can order a machine for you to use at home. What are the advantages? CPAP or BIPAP can be helpful if you have: Sleep apnea. Chronic obstructive pulmonary disease (COPD). Heart failure. Medical conditions that cause muscle weakness, including muscular dystrophy or amyotrophic lateral sclerosis (ALS). Other problems that cause breathing to be shallow, weak, abnormal, or difficult. CPAP and BIPAP are most commonly used for obstructive sleep apnea (OSA) to keep the airways from collapsing when the muscles relax during sleep. What are the risks? Generally, this is a safe treatment. However, problems may occur, including: Irritated skin or skin sores if the mask does not fit  properly. Dry or stuffy nose or nosebleeds. Dry mouth. Feeling gassy or bloated. Sinus or lung infection if the equipment is not cleaned properly. When should CPAP or BIPAP be used? In most cases, the mask only needs to be worn during sleep. Generally, the mask needs to be worn throughout the night and during any daytime naps. People with certain medical conditions may also need to wear the mask at other times, such as when they are awake. Follow instructions from your health care provider about when to use the machine. What happens during CPAP or BIPAP?  Both CPAP and BIPAP are provided by a small machine with a flexible plastic tube that attaches to a plastic mask that you wear. Air is blown through the mask into your nose or mouth. The amount of pressure that is used to blow the air can be adjusted on the machine. Your health care provider will set the pressure setting and help you find the best mask for you. Tips for using the mask Because the mask needs to be snug, some people feel trapped or closed-in (claustrophobic) when first using the mask. If you feel this way, you may need to get used to the mask. One way to do this is to hold the mask loosely over your nose or mouth and then gradually apply the mask more snugly. You can also gradually increase the amount of time that you use the mask. Masks are available in various types and sizes. If your mask does not fit well, talk with your health care provider about getting a different one. Some common types of masks include: Full face masks, which fit over the mouth and nose. Nasal masks, which fit over the nose. Nasal pillow or prong masks, which fit into the nostrils. If you are using a mask that fits over your nose and you tend to breathe through your mouth, a chin strap may be applied to help keep your mouth closed. Use  a skin barrier to protect your skin as told by your health care provider. Some CPAP and BIPAP machines have alarms that may  sound if the mask comes off or develops a leak. If you have trouble with the mask, it is very important that you talk with your health care provider about finding a way to make the mask easier to tolerate. Do not stop using the mask. There could be a negative impact on your health if you stop using the mask. Tips for using the machine Place your CPAP or BIPAP machine on a secure table or stand near an electrical outlet. Know where the on/off switch is on the machine. Follow instructions from your health care provider about how to set the pressure on your machine and when you should use it. Do not eat or drink while the CPAP or BIPAP machine is on. Food or fluids could get pushed into your lungs by the pressure of the CPAP or BIPAP. For home use, CPAP and BIPAP machines can be rented or purchased through home health care companies. Many different brands of machines are available. Renting a machine before purchasing may help you find out which particular machine works well for you. Your health insurance company may also decide which machine you may get. Keep the CPAP or BIPAP machine and attachments clean. Ask your health care provider for specific instructions. Check the humidifier if you have a dry stuffy nose or nosebleeds. Make sure it is working correctly. Follow these instructions at home: Take over-the-counter and prescription medicines only as told by your health care provider. Ask if you can take sinus medicine if your sinuses are blocked. Do not use any products that contain nicotine or tobacco. These products include cigarettes, chewing tobacco, and vaping devices, such as e-cigarettes. If you need help quitting, ask your health care provider. Keep all follow-up visits. This is important. Contact a health care provider if: You have redness or pressure sores on your head, face, mouth, or nose from the mask or head gear. You have trouble using the CPAP or BIPAP machine. You cannot tolerate  wearing the CPAP or BIPAP mask. Someone tells you that you snore even when wearing your CPAP or BIPAP. Get help right away if: You have trouble breathing. You feel confused. Summary CPAP and BIPAP are methods that use air pressure to keep your airways open and to help you breathe well. If you have trouble with the mask, it is very important that you talk with your health care provider about finding a way to make the mask easier to tolerate. Do not stop using the mask. There could be a negative impact to your health if you stop using the mask. Follow instructions from your health care provider about when to use the machine. This information is not intended to replace advice given to you by your health care provider. Make sure you discuss any questions you have with your health care provider. Document Revised: 07/14/2021 Document Reviewed: 11/13/2020 Elsevier Patient Education  2023 [employer].

## 2023-06-27 NOTE — Assessment & Plan Note (Addendum)
-   Sleep apnea is well-controlled on CPAP. He is 73% compliant with use >4 hours over the last year. He is having no significant residual apneas while using CPAP. Current pressure 6cm h20; AHI 1.8/hour. We will cancel CPAP titration study. No changes recommended. Continue to encourage patient wear CPAP nightly 4 to 6 hours or longer. Advised he work on weight loss efforts. We reviewed medications used for hypersomnia in patients with OSA but patient would like to avoid at this time. He will let us know if she wants to reconsider this. FU in 6 months or sooner if needed.

## 2023-06-28 ENCOUNTER — Ambulatory Visit (INDEPENDENT_AMBULATORY_CARE_PROVIDER_SITE_OTHER): Payer: 59 | Admitting: Neurology

## 2023-06-28 ENCOUNTER — Encounter: Payer: Self-pay | Admitting: Neurology

## 2023-06-28 VITALS — BP 141/72 | Resp 17 | Ht 69.0 in | Wt 267.5 lb

## 2023-06-28 DIAGNOSIS — G40209 Localization-related (focal) (partial) symptomatic epilepsy and epileptic syndromes with complex partial seizures, not intractable, without status epilepticus: Secondary | ICD-10-CM | POA: Insufficient documentation

## 2023-06-28 DIAGNOSIS — R519 Headache, unspecified: Secondary | ICD-10-CM

## 2023-06-28 DIAGNOSIS — R4189 Other symptoms and signs involving cognitive functions and awareness: Secondary | ICD-10-CM | POA: Diagnosis not present

## 2023-06-28 DIAGNOSIS — G4733 Obstructive sleep apnea (adult) (pediatric): Secondary | ICD-10-CM | POA: Diagnosis not present

## 2023-06-28 MED ORDER — LACOSAMIDE 100 MG PO TABS: 100.0000 mg | ORAL_TABLET | Freq: Two times a day (BID) | ORAL | 3 refills | Status: DC

## 2023-06-28 NOTE — Progress Notes (Signed)
[facility] [address] [place]. [place] [address] [phone]     ASSESSMENT/PLAN:  Kyle Mccarthy is a 67 y.o. year old male   Mild cognitive Impairment In the setting of anxiety, heightened stress at home  [facility]  24/30 (04/2022)             MRI of the brain 05/2022 unremarkable  EEG 05/2022 normal             Laboratory evaluation showed low B12 level at 219, repeat level was within normal limit 663.  Neuropsychology evaluation in February 2024 showed some slow information processing speed from predicted level of premorbid functioning, some weakness with visual reasoning and problem-solving, but very well-maintained verbal reasoning problem-solving capacity, mild auditory encoding weakness, memory deficit appeared to be primarily related to retrieval of newly learned information, significant improvement was made for visual memory on the recognition/cued recall, recurrent episode of worsening cognitive function, neuropsychology study suggest some of the visual hallucinations are directly related to severe sleep disturbance, secondary to earlier episode of unmanaged obstructive sleep apnea, but also improved with CPAP treatment,  The conclusion was " the patent does not appear to be consistent with any type of degenerative process, will not needed diagnosis of major neurocognitive disorder or dementia,  Recommended compliant with his CPAP machine,  Complex partial seizure on June 04, 2023  Witnessed by his sons, one of them suffered epilepsy,  Increase Vimpat up to 100 mg twice a day  No driving until seizure-free for 6 months  Return To Clinic With NP In 6 Months    HPI:   Kyle Mccarthy, is a 67 year old male seen in request by his primary care PA Lianne Moris, for evaluation of memory loss, initial evaluation was on May 12, 2022 with Dr. Terrace Arabia   I reviewed and summarized the referring note. PMHX. HTN Obesity Anxiety HLD BPH. 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," to avoid staying at home ", he noticed gradual onset of memory loss since 2018, word finding difficulties, transient confusion, he also reported to episode of sudden onset of visual hallucinations, while he was driving, he thought he saw a Paediatric nurse accident, while in reality there was no accident,  Since he was treated for obstructive sleep apnea, compliant with the CPAP machine, he overall felt much better, no longer has hallucinations, denies excessive daytime sleepiness, fatigue   He has 3 children, one of his sons does suffer complex partial seizure, he denies family history of epilepsy, he denies difficulty driving,  UPDATE April 11 2023: He was seen by Shanda Bumps in October 2023, continue complains of worsening memory loss, also had diagnosis of obstructive sleep apnea, had the home study around 2023 by his primary care, but setting was not adjusted since, per patient, but he was not so confident because of his memory loss  He is very frustrated, finding more difficulty expressing himself, today his main concern is persistent daily headache, 4 out of 10, bilateral frontal, no focal signs  He used to be a truck driver, now on short short-term disability  UPDATE July 10th 2024: Partial seizure prior hospital admission on June 04, 2023, he came  back playing golf with his 2 sons, sitting at the den, woke up at the hospital, I was able to talk with his son Thayer Ohm who has weakness that episode, he suddenly had right arm jerking movement, lasting for few minutes followed by postevent confusion  Personally reviewed MRI of brain on June 06 2023: 1. No acute  intracranial abnormality. 2. Findings of chronic microvascular ischemia and volume loss.  EEG by Dr. Melynda Ripple on June 05, 2023 was normal  He was discharged with Vimpat 50 mg twice a day, tolerating it well, retired,  He is seen by pulmonologist for obstructive sleep apnea, auto CPAP,  .  ROS:   14 system review of systems performed and negative with exception of those listed in HPI  OBJECTIVE:  Physical Exam  Vitals:   04/11/23 1447  BP: (!) 156/83  Pulse: (!) 54  Weight: 257 lb (116.6 kg)  Height: 5\' 8"  (1.727 m)   Body mass index is 39.08 kg/m. No results found.   PHYSICAL EXAMNIATION:  Gen: NAD, conversant, well nourised, well groomed                     Cardiovascular: Regular rate rhythm, no peripheral edema, warm, nontender. Eyes: Conjunctivae clear without exudates or hemorrhage Neck: Supple, no carotid bruits. Pulmonary: Clear to auscultation bilaterally   NEUROLOGICAL EXAM:  MENTAL STATUS: Speech/cognition: Awake, alert oriented to history taking and casual conversation  CRANIAL NERVES: CN II: Visual fields are full to confrontation.  Pupils are round equal and briskly reactive to light. CN III, IV, VI: extraocular movement are normal. No ptosis. CN V: Facial sensation is intact to pinprick in all 3 divisions bilaterally. Corneal responses are intact.  CN VII: Face is symmetric with normal eye closure and smile. CN VIII: Hearing is normal to casual conversation CN IX, X: Palate elevates symmetrically. Phonation is normal. CN XI: Head turning and shoulder shrug are intact CN XII: Tongue is midline with normal movements and no atrophy.  Narrow oropharyngeal space,  MOTOR: There is no pronator drift of out-stretched arms. Muscle bulk and tone are normal. Muscle strength is normal.  REFLEXES: Reflexes are 1 and symmetric at the biceps, triceps, knees, and ankles. Plantar responses are flexor.  SENSORY: Intact to light touch,   COORDINATION: Rapid  alternating movements and fine finger movements are intact. There is no dysmetria on finger-to-nose and heel-knee-shin.    GAIT/STANCE: Posture is normal. Gait is steady       PMH:  Past Medical History:  Diagnosis Date   Anxiety    Depression    Hx of blood clots    left leg years ago   Hypertension    Sleep apnea     PSH:  Past Surgical History:  Procedure Laterality Date   EXTRACORPOREAL SHOCK WAVE LITHOTRIPSY Left 03/15/2022   Procedure: EXTRACORPOREAL SHOCK WAVE LITHOTRIPSY (ESWL);  Surgeon: Milderd Meager., MD;  Location: [facility];  Service: Urology;  Laterality: Left;   EXTRACORPOREAL SHOCK WAVE LITHOTRIPSY Right 04/05/2022   Procedure: EXTRACORPOREAL SHOCK WAVE LITHOTRIPSY (ESWL);  Surgeon: Malen Gauze, MD;  Location: [facility];  Service: Urology;  Laterality: Right;   EYE SURGERY     cateract surgery 20 yrs ago   SKIN CANCER EXCISION      Social History:  Social History   Socioeconomic History   Marital status: Divorced    Spouse name: Not on file   Number of children: 3   Years of education: Not on  file   Highest education level: Some college, no degree  Occupational History   Not on file  Tobacco Use   Smoking status: Never   Smokeless tobacco: Never  Vaping Use   Vaping Use: Never used  Substance and Sexual Activity   Alcohol use: Yes    Alcohol/week: 2.0 standard drinks of alcohol    Types: 2 Cans of beer per week    Comment: occassional   Drug use: Never   Sexual activity: Not Currently  Other Topics Concern   Not on file  Social History Narrative   Not on file   Social Determinants of Health   Financial Resource Strain: Not on file  Food Insecurity: No Food Insecurity (06/05/2023)   Hunger Vital Sign    Worried About Running Out of Food in the Last Year: Never true    Ran Out of Food in the Last Year: Never true  Transportation Needs: No Transportation Needs (06/05/2023)   PRAPARE - Administrator, Civil Service  (Medical): No    Lack of Transportation (Non-Medical): No  Physical Activity: Not on file  Stress: Not on file  Social Connections: Not on file  Intimate Partner Violence: Not At Risk (06/05/2023)   Humiliation, Afraid, Rape, and Kick questionnaire    Fear of Current or Ex-Partner: No    Emotionally Abused: No    Physically Abused: No    Sexually Abused: No    Family History:  Family History  Problem Relation Age of Onset   Alcohol abuse Maternal Uncle    Post-traumatic stress disorder Son     Medications:   Current Outpatient Medications on File Prior to Visit  Medication Sig Dispense Refill   atorvastatin (LIPITOR) 80 MG tablet Take 80 mg by mouth daily.     cyclobenzaprine (FLEXERIL) 5 MG tablet Take 1 tablet (5 mg total) by mouth 2 (two) times daily as needed for muscle spasms. 30 tablet 1   lacosamide (VIMPAT) 50 MG TABS tablet Take 50 mg by mouth daily. Patient only taking one tablet once a day     propranolol ER (INDERAL LA) 80 MG 24 hr capsule Take 80 mg by mouth daily.     QUEtiapine (SEROQUEL) 100 MG tablet Take 100 mg by mouth at bedtime.     sertraline (ZOLOFT) 100 MG tablet Take 1 tablet (100 mg total) by mouth daily. 90 tablet 1   tamsulosin (FLOMAX) 0.4 MG CAPS capsule Take 1 capsule (0.4 mg total) by mouth daily after supper. 30 capsule 11   XARELTO 20 MG TABS tablet Take 20 mg by mouth daily.     zolpidem (AMBIEN) 10 MG tablet Take 1 tablet (10 mg total) by mouth at bedtime as needed for sleep. 30 tablet 0   Current Facility-Administered Medications on File Prior to Visit  Medication Dose Route Frequency Provider Last Rate Last Admin   lacosamide (VIMPAT) tablet 50 mg  50 mg Oral BID Amin, Loura Halt, MD        Allergies:   Allergies  Allergen Reactions   No Known Allergies       Levert Feinstein, M.D. Ph.D.  [facility] [address] [place], [place] [address] Phone: [phone] Fax:      [phone]

## 2023-06-29 ENCOUNTER — Telehealth: Payer: Self-pay | Admitting: Neurology

## 2023-06-29 NOTE — Telephone Encounter (Signed)
Pt is asking for a call with the effective date of his 6 months of not being able drive.

## 2023-06-29 NOTE — Telephone Encounter (Signed)
The patient returned my call. He was requesting clarification regarding the start date for his driving restriction. He asked if the start date was the day of the seizure or the appointment with Dr. Terrace Arabia. I clarified that the date of the seizure (06/04/23)  is when the six month time period starts. I informed him that he must be seizure free or blackout free for the entire six months after that date before he would be able to drive again according to [place]. He verbalized understanding and expressed appreciation for the call.

## 2023-06-29 NOTE — Telephone Encounter (Signed)
I left a voicemail for the patient requesting that he return my call to review the driving law. The patient had a seizure on 06/04/23.   The patient is a resident of [place]. Of note, according to [place] law "[employer]'s English as a second language teacher, established by our [employer], states that a person must be seizure-free or blackout-free for at least six months to establish medication and regain proper medical control before driving." More information can be found at [email or web address].com

## 2023-10-10 ENCOUNTER — Ambulatory Visit: Payer: 59 | Admitting: Adult Health

## 2023-11-17 ENCOUNTER — Ambulatory Visit: Payer: 59 | Admitting: Urology

## 2023-12-07 ENCOUNTER — Ambulatory Visit (INDEPENDENT_AMBULATORY_CARE_PROVIDER_SITE_OTHER): Payer: Medicare Other | Admitting: Audiology

## 2023-12-07 ENCOUNTER — Encounter (INDEPENDENT_AMBULATORY_CARE_PROVIDER_SITE_OTHER): Payer: Self-pay

## 2023-12-07 ENCOUNTER — Ambulatory Visit (INDEPENDENT_AMBULATORY_CARE_PROVIDER_SITE_OTHER): Payer: Medicare Other | Admitting: Otolaryngology

## 2023-12-07 ENCOUNTER — Ambulatory Visit (INDEPENDENT_AMBULATORY_CARE_PROVIDER_SITE_OTHER): Payer: Self-pay | Admitting: Audiology

## 2023-12-07 VITALS — Ht 70.0 in | Wt 245.0 lb

## 2023-12-07 DIAGNOSIS — H903 Sensorineural hearing loss, bilateral: Secondary | ICD-10-CM

## 2023-12-07 DIAGNOSIS — Z719 Counseling, unspecified: Secondary | ICD-10-CM

## 2023-12-07 DIAGNOSIS — H6121 Impacted cerumen, right ear: Secondary | ICD-10-CM | POA: Diagnosis not present

## 2023-12-07 DIAGNOSIS — H9313 Tinnitus, bilateral: Secondary | ICD-10-CM

## 2023-12-07 NOTE — Progress Notes (Signed)
Patient was seen today for a hearing aid check. He currently wears Oticon Real 2 hearing aids. Replaced the wax filters and domes, and brushed the microphones for both hearing aids. Removed the retention lines to assist him with proper placement. Connected his hearing aids to the software and ran Armed forces logistics/support/administrative officer. Updated the hearing aids to his new hearing test. Set him around 70% overall gain and turned on the comfort settings. He was happy with the volume, clarity, and balance between his ears. Instructed him to call if any concerns arise.   No charge for today's visit.  Conley Rolls Makhia Vosler, AUD, CCC-A 12/07/23

## 2023-12-07 NOTE — Progress Notes (Signed)
  [address] [place], [place] [address] [phone]  Audiological Evaluation    Name: Kyle Mccarthy     DOB:   07-06-1956      MRN:   [id number]                                                                                     Service Date: 12/07/2023        Patient was referred today for a hearing evaluation by Dr. Karle Barr.   Symptoms Yes Details  Hearing loss  [x]  Patient reported perceiving hearing loss in both ears.  Tinnitus  [x]  Patient reported experiencing constant tinnitus.  Balance problems  [x]  Patient reported intermittent vertigo sensations.  Previous ear surgeries  []  Patient denied any previous ear surgeries.  Amplification  [x]  Patient reported the use of Oticon Real 2 hearing aids.    Tympanogram: Right ear: Could not obtain seal; middle ear status is unable to be determined at this time. Left ear: Could not obtain seal; middle ear status is unable to be determined at this time.    Hearing Evaluation: The audiogram was completed using conventional audiometric techniques under headphones with good reliability.   The hearing test results indicate: Right ear: Normal hearing sensitivity at 250 Hz sloping to mild sensorineural hearing loss from [phone] gradually sloping to profound sensorineural hearing loss from 1500-8000 Hz. Left ear: Normal hearing sensitivity at 250 Hz sloping to mild sensorineural hearing loss from 500-750 Hz gradually sloping to profound sensorineural hearing loss from 1000-8000 Hz.  Speech Audiometry: Right ear- Speech Reception Threshold (SRT) was obtained at 50 dBHL. Left ear- Speech Reception Threshold (SRT) was obtained at 50 dBHL.   Word Recognition Score Tested using NU-6 (MLV) Right ear: 52% was obtained at a presentation level of 70 dBHL which is deemed as fair understanding. Left ear: 52% was obtained at a presentation level of 70 dBHL which is deemed as fair understanding.    Impression:  There  is not a significant difference between puretone thresholds and word recognition scores between ears.   Recommendations: Repeat audiogram when changes are perceived or per MD. Continue use of hearing aids. Consider various tinnitus strategies, including the use of a noise generator, hearing aids, or tinnitus retraining therapy.   Conley Rolls Lynnzie Blackson, AUD, CCC-A 12/07/23

## 2023-12-10 DIAGNOSIS — H9313 Tinnitus, bilateral: Secondary | ICD-10-CM | POA: Insufficient documentation

## 2023-12-10 DIAGNOSIS — H903 Sensorineural hearing loss, bilateral: Secondary | ICD-10-CM | POA: Insufficient documentation

## 2023-12-10 DIAGNOSIS — H6121 Impacted cerumen, right ear: Secondary | ICD-10-CM | POA: Insufficient documentation

## 2023-12-10 NOTE — Progress Notes (Signed)
Patient ID: Kyle Mccarthy, male   DOB: Sep 02, 1956, 67 y.o.   MRN: [id number]  Follow-up: Bilateral hearing loss, tinnitus  HPI: The patient is a 67 year old male who returns today for his follow-up evaluation.  The patient was previously seen for bilateral hearing loss and tinnitus.  At his last visit in March 2023, the patient was noted to have bilateral high-frequency sensorineural hearing loss.  His tinnitus was likely a direct result of the hearing loss.  The patient was fitted with bilateral hearing aids.  The patient returns today reporting persistent bilateral tinnitus and hearing loss.  She currently denies any otalgia, otorrhea, or vertigo.  Exam: General: Communicates without difficulty, well nourished, no acute distress. Head: Normocephalic, no evidence injury, no tenderness, facial buttresses intact without stepoff. Face/sinus: No tenderness to palpation and percussion. Facial movement is normal and symmetric. Eyes: PERRL, EOMI. No scleral icterus, conjunctivae clear. Neuro: CN II exam reveals vision grossly intact.  No nystagmus at any point of gaze. Ears: Auricles well formed without lesions.  Right ear cerumen impaction.  The left ear canal and tympanic membrane are normal.  Nose: External evaluation reveals normal support and skin without lesions.  Dorsum is intact.  Anterior rhinoscopy reveals congested mucosa over anterior aspect of inferior turbinates and intact septum.  No purulence noted. Oral:  Oral cavity and oropharynx are intact, symmetric, without erythema or edema.  Mucosa is moist without lesions. Neck: Full range of motion without pain.  There is no significant lymphadenopathy.  No masses palpable.  Thyroid bed within normal limits to palpation.  Parotid glands and submandibular glands equal bilaterally without mass.  Trachea is midline. Neuro:  CN 2-12 grossly intact.   Procedure: Right ear cerumen disimpaction Anesthesia: None Description: Under the operating microscope,  the cerumen is carefully removed with a combination of cerumen currette, alligator forceps, and suction catheters.  After the cerumen is removed, the TMs are noted to be normal.  No mass, erythema, or lesions. The patient tolerated the procedure well.    His hearing test shows bilateral progressive high-frequency sensorineural hearing loss.  Assessment: 1.  Right ear cerumen impaction.  After the cerumen disimpaction procedure, both tympanic membranes and middle ear spaces are noted to be normal. 2.  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 3.  Bilateral tinnitus, secondary to his hearing loss.  Plan: 1.  Otomicroscopy with bilateral cerumen disimpaction. 2.  The physical exam findings and the hearing test results are reviewed with the patient. 3.  Hearing aid adjustment based on his hearing test today. 4.  The patient will return for reevaluation in 1 year.

## 2023-12-18 ENCOUNTER — Other Ambulatory Visit: Payer: Self-pay

## 2023-12-18 DIAGNOSIS — N200 Calculus of kidney: Secondary | ICD-10-CM

## 2023-12-22 ENCOUNTER — Ambulatory Visit: Payer: Medicare Other | Admitting: Urology

## 2023-12-22 ENCOUNTER — Ambulatory Visit (HOSPITAL_COMMUNITY)
Admission: RE | Admit: 2023-12-22 | Discharge: 2023-12-22 | Disposition: A | Discharge status: Home / Self Care | Payer: Medicare Other | Source: Ambulatory Visit | Attending: Urology

## 2023-12-22 VITALS — BP 165/89 | HR 43

## 2023-12-22 DIAGNOSIS — R351 Nocturia: Secondary | ICD-10-CM

## 2023-12-22 DIAGNOSIS — N411 Chronic prostatitis: Secondary | ICD-10-CM | POA: Diagnosis not present

## 2023-12-22 DIAGNOSIS — N200 Calculus of kidney: Secondary | ICD-10-CM

## 2023-12-22 LAB — URINALYSIS, ROUTINE W REFLEX MICROSCOPIC
Bilirubin, UA: NEGATIVE
Glucose, UA: NEGATIVE
Ketones, UA: NEGATIVE
Leukocytes,UA: NEGATIVE
Nitrite, UA: NEGATIVE
Protein,UA: NEGATIVE
RBC, UA: NEGATIVE
Specific Gravity, UA: 1.03 (ref 1.005–1.030)
Urobilinogen, Ur: 0.2 mg/dL (ref 0.2–1.0)
pH, UA: 6 (ref 5.0–7.5)

## 2023-12-22 MED ORDER — TAMSULOSIN HCL 0.4 MG PO CAPS: 0.4000 mg | ORAL_CAPSULE | Freq: Every day | ORAL | 11 refills | Status: DC

## 2023-12-22 MED ORDER — DOXYCYCLINE HYCLATE 100 MG PO CAPS: 100.0000 mg | ORAL_CAPSULE | Freq: Two times a day (BID) | ORAL | 0 refills | Status: DC

## 2023-12-22 NOTE — Progress Notes (Signed)
 12/22/2023 12:26 PM   Lynwood Ozell Devonshire [birth year] [id number]  Referring provider: Dow Longs, PA-C [address] [place],  [place] [address]  Followup nephrolithiasis and nocturia   HPI: [name] is a 28bn here followup for nephrolithiasis and nocturia. IPSS 10 QOl 2 on flomax  0.4mg  daily. For the past 2 months he has had dark colored semen. No worsening LUTS. No dysuria or hematuria   PMH: Past Medical History:  Diagnosis Date   Anxiety    Depression    Hx of blood clots    left leg years ago   Hypertension    Sleep apnea     Surgical History: Past Surgical History:  Procedure Laterality Date   EXTRACORPOREAL SHOCK WAVE LITHOTRIPSY Left 03/15/2022   Procedure: EXTRACORPOREAL SHOCK WAVE LITHOTRIPSY (ESWL);  Surgeon: Roseann Adine PARAS., MD;  Location: [facility];  Service: Urology;  Laterality: Left;   EXTRACORPOREAL SHOCK WAVE LITHOTRIPSY Right 04/05/2022   Procedure: EXTRACORPOREAL SHOCK WAVE LITHOTRIPSY (ESWL);  Surgeon: Sherrilee Belvie CROME, MD;  Location: [facility];  Service: Urology;  Laterality: Right;   EYE SURGERY     cateract surgery 20 yrs ago   SKIN CANCER EXCISION      Home Medications:  Allergies as of 12/22/2023       Reactions   No Known Allergies         Medication List        Accurate as of December 22, 2023 12:26 PM. If you have any questions, ask your nurse or doctor.          atorvastatin 80 MG tablet Commonly known as: LIPITOR Take 80 mg by mouth daily.   cyclobenzaprine  5 MG tablet Commonly known as: FLEXERIL  Take 1 tablet (5 mg total) by mouth 2 (two) times daily as needed for muscle spasms.   Lacosamide  100 MG Tabs Take 1 tablet (100 mg total) by mouth 2 (two) times daily.   propranolol ER 80 MG 24 hr capsule Commonly known as: INDERAL LA Take 80 mg by mouth daily.   QUEtiapine 100 MG tablet Commonly known as: SEROQUEL Take 100 mg by mouth at bedtime.   sertraline  100 MG tablet Commonly known as: ZOLOFT  Take 1 tablet (100 mg  total) by mouth daily.   tamsulosin  0.4 MG Caps capsule Commonly known as: FLOMAX  Take 1 capsule (0.4 mg total) by mouth daily after supper.   Xarelto 20 MG Tabs tablet Generic drug: rivaroxaban Take 20 mg by mouth daily.   zolpidem  10 MG tablet Commonly known as: AMBIEN  Take 1 tablet (10 mg total) by mouth at bedtime as needed for sleep.        Allergies:  Allergies  Allergen Reactions   No Known Allergies     Family History: Family History  Problem Relation Age of Onset   Alcohol abuse Maternal Uncle    Post-traumatic stress disorder Son     Social History:  reports that he has never smoked. He has never used smokeless tobacco. He reports current alcohol use of about 2.0 standard drinks of alcohol per week. He reports that he does not use drugs.  ROS: All other review of systems were reviewed and are negative except what is noted above in HPI  Physical Exam: BP (!) 165/89   Pulse (!) 43   Constitutional:  Alert and oriented, No acute distress. HEENT:  AT, moist mucus membranes.  Trachea midline, no masses. Cardiovascular: No clubbing, cyanosis, or edema. Respiratory: Normal respiratory effort, no increased work of  breathing. GI: Abdomen is soft, nontender, nondistended, no abdominal masses GU: No CVA tenderness.  Lymph: No cervical or inguinal lymphadenopathy. Skin: No rashes, bruises or suspicious lesions. Neurologic: Grossly intact, no focal deficits, moving all 4 extremities. Psychiatric: Normal mood and affect.  Laboratory Data: Lab Results  Component Value Date   WBC 5.4 06/06/2023   HGB 12.7 (L) 06/06/2023   HCT 37.8 (L) 06/06/2023   MCV 95.5 06/06/2023   PLT 128 (L) 06/06/2023    Lab Results  Component Value Date   CREATININE 1.12 06/06/2023    No results found for: PSA  No results found for: TESTOSTERONE  Lab Results  Component Value Date   HGBA1C 5.2 06/05/2023    Urinalysis    Component Value Date/Time   COLORURINE YELLOW  06/04/2023 1838   APPEARANCEUR CLEAR 06/04/2023 1838   APPEARANCEUR Clear 05/17/2023 1316   LABSPEC 1.017 06/04/2023 1838   PHURINE 5.0 06/04/2023 1838   GLUCOSEU NEGATIVE 06/04/2023 1838   HGBUR NEGATIVE 06/04/2023 1838   BILIRUBINUR NEGATIVE 06/04/2023 1838   BILIRUBINUR Negative 05/17/2023 1316   KETONESUR NEGATIVE 06/04/2023 1838   PROTEINUR NEGATIVE 06/04/2023 1838   NITRITE NEGATIVE 06/04/2023 1838   LEUKOCYTESUR NEGATIVE 06/04/2023 1838    Lab Results  Component Value Date   LABMICR Comment 05/17/2023   WBCUA 0-5 06/01/2022   LABEPIT None seen 06/01/2022   MUCUS Many (A) 04/20/2022   BACTERIA None seen 06/01/2022    Pertinent Imaging: KUb today: Images reviewed and discussed with the patient  Results for orders placed in visit on 05/17/23  Abdomen 1 view (KUB)  Narrative CLINICAL DATA:  Nephrolithiasis.  EXAM: ABDOMEN - 1 VIEW  COMPARISON:  04/20/2022.  FINDINGS: The bowel gas pattern is normal. No radio-opaque calculi or other significant radiographic abnormality are seen.  IMPRESSION: Negative.   Electronically Signed By: Fonda Field M.D. On: 05/19/2023 22:07  No results found for this or any previous visit.  No results found for this or any previous visit.  No results found for this or any previous visit.  Results for orders placed during the hospital encounter of 04/19/23  Ultrasound renal complete  Narrative CLINICAL DATA:  Nephrolithiasis  EXAM: RENAL / URINARY TRACT ULTRASOUND COMPLETE  COMPARISON:  Renal ultrasound 10/18/2022  FINDINGS: Right Kidney:  Renal measurements: 11.0 x 5.1 x 6.0 cm = volume: 176.8 mL. The renal cortical thickness and echogenicity. No hydronephrosis. There is a simple appearing 3.5 cm cyst superior pole right kidney. Opening follow-up needed.  Left Kidney:  Renal measurements: 12.5 x 6.3 x 5.6 cm = volume: 229.7 mL. Normal renal cortical thickness and echogenicity. No hydronephrosis. There is  a 15 mm probable stone midpole left kidney.  Bladder:  Appears normal for degree of bladder distention.  Other:  None.  IMPRESSION: 1. No hydronephrosis. 2. Left nephrolithiasis.   Electronically Signed By: Bard Moats M.D. On: 04/21/2023 06:42  No results found for this or any previous visit.  No results found for this or any previous visit.  Results for orders placed in visit on 03/09/22  CT RENAL STONE STUDY  Narrative CLINICAL DATA:  Flank pain, history of nephrolithiasis, left lower quadrant pain for 1 week  EXAM: CT ABDOMEN AND PELVIS WITHOUT CONTRAST  TECHNIQUE: Multidetector CT imaging of the abdomen and pelvis was performed following the standard protocol without IV contrast.  RADIATION DOSE REDUCTION: This exam was performed according to the departmental dose-optimization program which includes automated exposure control, adjustment of the mA and/or kV  according to patient size and/or use of iterative reconstruction technique.  COMPARISON:  None available  FINDINGS: Lower chest: Scattered areas of bibasilar atelectasis. Subcentimeter calcified granuloma in the left lower lobe. Mild cardiac enlargement. No pericardial or pleural effusion.  Hepatobiliary: Limited without IV contrast. Round 4.4 cm hypodense lesion in the right liver posteriorly has relatively low Hounsfield measurements, image 17/2, but remains indeterminate by noncontrast imaging. This could be further evaluated with abdominal ultrasound to determine if the lesion is solid or cystic. There is an additional ill-defined subcapsular hypodense lesion more inferior in the right liver measuring 2.1 cm, image 27/2.  No biliary dilatation or obstruction pattern.  Cholelithiasis noted. Gallbladder nondistended. Common bile duct nondilated.  Pancreas: Unremarkable. No pancreatic ductal dilatation or surrounding inflammatory changes.  Spleen: Normal in size without focal  abnormality.  Adrenals/Urinary Tract: Normal adrenal glands.  Kidneys demonstrate tiny punctate nonobstructing intrarenal calculi bilaterally. No acute hydronephrosis. Ureters are symmetric and decompressed.  However, the left distal ureter does demonstrate a nonobstructing 5 mm calculus, image 73/2 in the pelvis very close to the left UVJ.  Bladder unremarkable.  Stomach/Bowel: Negative for bowel obstruction, significant dilatation, ileus, or free air. Normal appendix demonstrated. Scattered left colon and sigmoid diverticulosis without acute inflammatory process.  No free fluid, fluid collection, hemorrhage, hematoma, abscess or ascites.  Vascular/Lymphatic: Limited without IV contrast. Aorta atherosclerotic. Negative for aneurysm. No retroperitoneal hemorrhage or hematoma.  No bulky adenopathy.  Nonspecific small nonenlarged mesenteric root lymph nodes with slight mesenteric haziness, image 34/2. This can be seen with mesenteric adenitis/panniculitis.  Reproductive: Prostate gland is mildly enlarged. Seminal vesicles are symmetric. Prostate calcifications noted.  Other: No abdominal wall hernia or abnormality. No abdominopelvic ascites.  Musculoskeletal: Degenerative changes throughout the spine. Lower lumbar facet arthropathy. L5-S1 advanced degenerative disc disease noted. No acute osseous finding.  IMPRESSION: 5 mm nonobstructing left distal ureteral calculus close to the left UVJ.  Additional nonobstructing punctate intrarenal calculi bilaterally.  Right liver indeterminate hypodense lesions measuring up to 4.4 cm. Consider initial evaluation with nonemergent follow-up abdominal ultrasound to determine if solid or cystic.  Cholelithiasis  Aortic Atherosclerosis (ICD10-I70.0).   Electronically Signed By: CHRISTELLA.  Shick M.D. On: 03/09/2022 10:37   Assessment & Plan:    1. Kidney stones (Primary) Followup 6 months with KUB - Urinalysis, Routine w reflex  microscopic  2. Nocturia -continue flomax  0.4mg  daily  3. Chronic prostatitis Doxyccyline 100mg  BID for 28 days   No follow-ups on file.  Belvie Clara, MD  [facility] [place]

## 2023-12-28 ENCOUNTER — Encounter: Payer: Self-pay | Admitting: Primary Care

## 2023-12-28 ENCOUNTER — Ambulatory Visit (INDEPENDENT_AMBULATORY_CARE_PROVIDER_SITE_OTHER): Payer: Medicare Other | Admitting: Primary Care

## 2023-12-28 VITALS — BP 138/68 | HR 54 | Temp 98.5°F | Ht 70.0 in | Wt 264.2 lb

## 2023-12-28 DIAGNOSIS — K219 Gastro-esophageal reflux disease without esophagitis: Secondary | ICD-10-CM

## 2023-12-28 DIAGNOSIS — G4733 Obstructive sleep apnea (adult) (pediatric): Secondary | ICD-10-CM | POA: Diagnosis not present

## 2023-12-28 NOTE — Patient Instructions (Addendum)
 Please call [facility] and set a time today you can bring CPAP machine/ SD card by for download and have them fax it to [facility] [phone] Attn: Landry Ferrari  -OBSTRUCTIVE SLEEP APNEA: Obstructive sleep apnea is a condition where your breathing stops and starts repeatedly during sleep. You are doing well with your CPAP machine, which helps keep your airway open. Continue using it every night and bring your CPAP card to [facility] for a data download and review.  -DAYTIME FATIGUE: Daytime fatigue means feeling very tired during the day even after a full night's sleep. Your medications, Seroquel and Ambien , might be contributing to this. Please discuss with your psychiatrist about possibly reducing the dose of Seroquel.  -GASTROESOPHAGEAL REFLUX DISEASE (GERD): GERD is a condition where stomach acid frequently flows back into the tube connecting your mouth and stomach, causing indigestion. You should start taking Omeprazole daily for the next couple of weeks. Take it on an empty stomach and eat a light snack 30 minutes later.   Follow-up 1 year with Beth NP (bring CPAP machine/SD card by [facility] prior to your next visit)

## 2023-12-28 NOTE — Progress Notes (Addendum)
 @Patient  ID: Kyle Mccarthy, male    DOB: 03-31-1956, 68 y.o.   MRN: [id number]  Chief Complaint  Patient presents with   Follow-up    Wearing CPAP-possibly leaking some with nasal, doing well    Referring provider: Dow Longs, PA-C  HPI: 68 year old male, never smoked. PMH significant for hypertension, obstructive sleep apnea, insomnia, cognitive impairment, depression, obesity, DVT on Xarelto.  Previous [facility] pulmonary encounter:  05/10/2023 Patient presents today for sleep consult. He has been seeing neurology for memory loss, last seen in April. He has been having daily headaches. Hx sleep apnea, he had a home sleep study with his primary care in 2023. Currently on CPAP. Feels CPAP is not working properly, settings have not been adjusted. He continues to have fatigue symptoms. He lives alone and is unsure if he snores. He takes ambien  nightly for insomnia. He gets on average 6.5 or 7 hours of sleep a night. Patient used to work as a naval architect, he is now on short term disability. He likes to stay busy. He went from working 60 hours a week to not work. His primary took him out of work for 1 year, may look into retiring after that. DME company is [employer] out of [place] .   Patient had a sleep study on 02/04/2021 that showed severe obstructive sleep apnea, AHI 60.5 an hour.  Patient spent 219 minutes with an oxygen level less than 90%.  He was started on auto CPAP.  Sleep questionnaire Symptoms-  Duration and quality of his sleep fluctuates  Prior sleep study- February 17th 2022, results are not in chart (requesting from [facility] in [place] -Dr. Longs Ivanoff )  Bedtime- 10-11pm Time to fall asleep- 5 mins  Nocturnal awakenings- twice  Out of bed/start of day- 7am  Weight changes- 25 lbs  Do you operate heavy machinery- yes  Do you currently wear CPAP- yes  Do you current wear oxygen- no [facility]worth- 17  06/27/2023 Patient presents today for follow-up OSA.   Patient was seen for sleep consult in May due to fatigue symptoms.  He has a history of sleep apnea is currently on CPAP.  Since his last overview we were able to receive compliance report from his DME company. He is 73% complaint with CPAP use between July 2023-July 2024. Current pressure 6cm h20 with residual AHI 1.8/hour. He takes Seroquel and Ambien  10mg  at bedtime. He has no trouble falling or staying asleep. He does not experience any morning grogginess. He is getting between 5-7 hours of sleep a night. He remains very tired during the day, feels a lot of it has to do with his weight. He tells me he has never weighed this much before. He was hospitalizaed for TIA recently, seeing [facility] tomorrow. We discuss medication to help with hypersomnia. Stimulants can increase blood pressure and heart rate and would need to be used with caution. Patient would like to avoid medication use at this time.  Compliance download Res[employer] 06/26/22-06/25/23 Usage 277/365; 267 (73%) Average usage 5.2 hours Pressure 6cm h20  AHI 1.8    12/28/2023- interim hx  Discussed the use of AI scribe software for clinical note transcription with the patient, who gave verbal consent to proceed.  History of Present Illness   The patient, with a history of severe sleep apnea diagnosed two years ago, has been using a CPAP machine consistently every night for approximately nine hours of sleep. He reports a significant benefit from the CPAP machine,  with the only noted issue being dry mouth if the machine is not used after waking up to use the bathroom. The patient also reports chronic fatigue, but denies any impairment in daily activities. He is currently on Ambien  10mg  at bedtime, Zoloft  in the morning, and Seroquel at bedtime. The patient reports no residual grogginess or impairment the following day.  The patient also has a history of a transient ischemic attack (TIA) that occurred in June. He reports memory issues since  the event. The patient also mentions a bout of severe indigestion that has been ongoing since the previous night. He is on omeprazole, but does not take it regularly.   Received compliance report from 11/25/2023 - 12/24/2023 Usage days 30/30 days (100%) greater than 4 hours Average usage 8 hours Pressure 4-20 centimeters H2O (6.7 cm H2O-95%) Air leaks 9.1 L/min (95%) AHI 2.0  Allergies  Allergen Reactions   No Known Allergies     Immunization History  Administered Date(s) Administered   Fluad Quad(high Dose 65+) 10/20/2023   PFIZER(Purple Top)SARS-COV-2 Vaccination 02/22/2020, 03/14/2020, 10/12/2020, 10/20/2023   Pneumococcal Polysaccharide-23 08/22/2019    Past Medical History:  Diagnosis Date   Anxiety    Depression    Hx of blood clots    left leg years ago   Hypertension    Sleep apnea     Tobacco History: Social History   Tobacco Use  Smoking Status Never  Smokeless Tobacco Never   Counseling given: Not Answered   Outpatient Medications Prior to Visit  Medication Sig Dispense Refill   atorvastatin (LIPITOR) 80 MG tablet Take 80 mg by mouth daily.     cyclobenzaprine  (FLEXERIL ) 5 MG tablet Take 1 tablet (5 mg total) by mouth 2 (two) times daily as needed for muscle spasms. 30 tablet 1   doxycycline  (VIBRAMYCIN ) 100 MG capsule Take 1 capsule (100 mg total) by mouth every 12 (twelve) hours. 56 capsule 0   lacosamide  100 MG TABS Take 1 tablet (100 mg total) by mouth 2 (two) times daily. 180 tablet 3   omeprazole (PRILOSEC) 20 MG capsule Take 20 mg by mouth daily.     omeprazole (PRILOSEC) 40 MG capsule Take 40 mg by mouth daily.     propranolol ER (INDERAL LA) 80 MG 24 hr capsule Take 80 mg by mouth daily.     QUEtiapine (SEROQUEL) 100 MG tablet Take 100 mg by mouth at bedtime.     sertraline  (ZOLOFT ) 100 MG tablet Take 1 tablet (100 mg total) by mouth daily. 90 tablet 1   tamsulosin  (FLOMAX ) 0.4 MG CAPS capsule Take 1 capsule (0.4 mg total) by mouth daily after  supper. 30 capsule 11   XARELTO 20 MG TABS tablet Take 20 mg by mouth daily.     zolpidem  (AMBIEN ) 10 MG tablet Take 1 tablet (10 mg total) by mouth at bedtime as needed for sleep. 30 tablet 0   No facility-administered medications prior to visit.   Review of Systems  Review of Systems  Constitutional: Negative.   HENT: Negative.    Respiratory: Negative.    Cardiovascular: Negative.   Gastrointestinal:        Reflux  Psychiatric/Behavioral:  Negative for sleep disturbance.     Physical Exam  BP 138/68 (BP Location: Left Arm, Cuff Size: Large)   Pulse (!) 54   Temp 98.5 F (36.9 C) (Temporal)   Ht 5' 10 (1.778 m)   Wt 264 lb 3.2 oz (119.8 kg)   SpO2 99%  BMI 37.91 kg/m  Physical Exam Constitutional:      Appearance: Normal appearance.  HENT:     Mouth/Throat:     Mouth: Mucous membranes are moist.     Pharynx: Oropharynx is clear.  Cardiovascular:     Rate and Rhythm: Normal rate and regular rhythm.  Pulmonary:     Effort: Pulmonary effort is normal.     Breath sounds: Normal breath sounds.  Musculoskeletal:        General: Normal range of motion.  Skin:    General: Skin is warm and dry.  Neurological:     General: No focal deficit present.     Mental Status: He is alert and oriented to person, place, and time. Mental status is at baseline.  Psychiatric:        Mood and Affect: Mood normal.        Behavior: Behavior normal.        Thought Content: Thought content normal.        Judgment: Judgment normal.      Lab Results:  CBC    Component Value Date/Time   WBC 5.4 06/06/2023 0415   RBC 3.96 (L) 06/06/2023 0415   HGB 12.7 (L) 06/06/2023 0415   HGB 13.0 04/11/2023 1534   HCT 37.8 (L) 06/06/2023 0415   HCT 39.3 04/11/2023 1534   PLT 128 (L) 06/06/2023 0415   MCV 95.5 06/06/2023 0415   MCV 94 04/11/2023 1534   MCH 32.1 06/06/2023 0415   MCHC 33.6 06/06/2023 0415   RDW 12.8 06/06/2023 0415   RDW 12.2 04/11/2023 1534   LYMPHSABS 1.5 06/04/2023  1811   LYMPHSABS 1.6 04/11/2023 1534   MONOABS 0.4 06/04/2023 1811   EOSABS 0.1 06/04/2023 1811   EOSABS 0.1 04/11/2023 1534   BASOSABS 0.0 06/04/2023 1811   BASOSABS 0.0 04/11/2023 1534    BMET    Component Value Date/Time   NA 139 06/06/2023 0415   NA 143 04/11/2023 1534   K 3.5 06/06/2023 0415   CL 106 06/06/2023 0415   CO2 25 06/06/2023 0415   GLUCOSE 95 06/06/2023 0415   BUN 19 06/06/2023 0415   BUN 14 04/11/2023 1534   CREATININE 1.12 06/06/2023 0415   CALCIUM 8.7 (L) 06/06/2023 0415   GFRNONAA >60 06/06/2023 0415   GFRAA >60 08/22/2019 0629    BNP No results found for: BNP  ProBNP No results found for: PROBNP  Imaging: No results found.   Assessment & Plan:   1. Obstructive sleep apnea (Primary)  2. Gastroesophageal reflux disease, unspecified whether esophagitis present  Sleep apnea Patient reports consistent use of CPAP with reported benefit. Noted dry mouth when not using CPAP. Unable to get download from SD card, he uses a Resvent iBreeze auto CPAP machine. Advised patient bring CPAP card to [facility] for data download and review.  Daytime Fatigue Despite adequate sleep duration and CPAP use, reports of persistent daytime fatigue. Currently on Seroquel and Ambien  which may contribute to daytime fatigue. Advised patient discuss with prescribing psychiatrist about potential dose reduction of Seroquel.  Gastroesophageal Reflux Disease Reports of severe indigestion. Infrequent use of Omeprazole. -Increase Omeprazole use to daily for the next couple of weeks. Take on an empty stomach and eat a light snack 30 minutes later.  Follow-up in one year.      Almarie LELON Ferrari, NP 12/28/2023

## 2024-01-02 ENCOUNTER — Encounter: Payer: Self-pay | Admitting: Urology

## 2024-01-02 NOTE — Progress Notes (Signed)
 [facility] [address] [place]. [place] [address] [phone]     ASSESSMENT/PLAN:  Kyle Mccarthy is a 68 y.o. year old male   Mild cognitive Impairment In the setting of anxiety, heightened stress at home  MMSE 27/30 (prior 21/30) Suspect memory loss complaints multifactoral in setting of multiple comorbidities, hearing loss, OSA, underlying mood disorder, significant stressors, daily headaches, and polypharmacy Discussed importance of nightly compliance with CPAP therapy Continue to follow with PCP for depression management, consider reevaluation by psychiatry but will defer to PCP Encouraged daily use of hearing aids, discussed hearing loss can contribute to short-term memory difficulties and can also help tinnitus             MRI of the brain 05/2022 and 05/2023 chronic microvascular ischemia and volume loss  EEG 05/2022 and 05/2023 normal             Laboratory evaluation showed low B12 level at 219, repeat level was within normal limit 663.  Neuropsychology evaluation in February 2024 showed some slow information processing speed from predicted level of premorbid functioning, some weakness with visual reasoning and problem-solving, but very well-maintained verbal reasoning problem-solving capacity, mild auditory encoding weakness, memory deficit appeared to be primarily related to retrieval of newly learned information, significant improvement was made for visual memory on the recognition/cued recall, recurrent episode of worsening cognitive function, neuropsychology study suggest some of the visual hallucinations are directly related to severe sleep disturbance, secondary to earlier episode of unmanaged obstructive sleep apnea, but also improved with CPAP treatment, The conclusion was " the patent does not appear to be consistent with any type of degenerative process, will not meet diagnosis of major neurocognitive disorder or dementia,   Daily  headaches  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, currently present daily  Recommend starting Ajovy  monthly injection  Continue propranolol ER 80 mg daily per PCP, unable to increase dose further as HR already low  Would not recommend adding antidepressant class medications as he is already on multiple mood stabilizing medications and he is currently on antiseizure medication lacosamide  for seizure disorder   Complex partial seizure on June 04, 2023  No additional seizure activity  Continue Vimpat  100 mg twice a day  Advised to call with any recurrent seizures   OSA on CPAP  Discussed importance of continued nightly usage  Currently managed by pulmonology  Patient expressed interest in weight loss medication, advised to discuss trial of Zepbound with pulmonologist or PCP      Follow-up in 6 months or call earlier if needed      HPI:   Update 01/03/2024 Kyle Mccarthy: Patient returns for follow-up visit unaccompanied.   His main concern today is in regards to persistent memory difficulties and daily headaches.  Believes memory has continued to decline since prior visit.  He has speech hesitancy and delayed recall. MMSE today 27/30 (prior 21/30).  Able to maintain ADLs and IADLs independently.  He continues to drive without difficulty but is now retired.  Reports daily headaches which can fluctuate in severity, can also experience brain zaps sensation.  Can have photophobia and phonophobia with more severe headaches.  He is currently on propranolol ER 80 mg daily.  He does admit to significant stressors which he feels could be contributing.  He does have underlying depression and currently on sertraline  and Seroquel, previously followed by psychiatry but now managed by PCP, he does continue to struggle with depression symptoms.  Reports nightly use of CPAP,  does have difficulty falling asleep but this is helped by Ambien .  CPAP managed by pulmonology.  He also complains of persistent  tinnitus which can be overly bothersome, he has been prescribed hearing aids by audiology but does not wear them consistently.  He questions medication to help with weight loss, previously on Wegovy with benefit but no longer covered by insurance.  He remains on lacosamide  100 mg twice daily, no additional seizure activity that he is aware of.      History provided for reference purposes only UPDATE July 10th 2024 Dr. Gracie Mccarthy: Partial seizure prior hospital admission on June 04, 2023, he came back playing golf with his 2 sons, sitting at the den, woke up at the hospital, I was able to talk with his son Kyle Mccarthy who has weakness that episode, he suddenly had right arm jerking movement, lasting for few minutes followed by postevent confusion  Personally reviewed MRI of brain on June 06 2023: 1. No acute intracranial abnormality. 2. Findings of chronic microvascular ischemia and volume loss.  EEG by Dr. Merceda Mccarthy on June 05, 2023 was normal  He was discharged with Vimpat  50 mg twice a day, tolerating it well, retired,  He is seen by pulmonologist for obstructive sleep apnea, auto CPAP,   UPDATE April 11 2023 Dr. Gracie Mccarthy: He was seen by Kyle Mccarthy in October 2023, continue complains of worsening memory loss, also had diagnosis of obstructive sleep apnea, had the home study around 2023 by his primary care, but setting was not adjusted since, per patient, but he was not so confident because of his memory loss  He is very frustrated, finding more difficulty expressing himself, today his main concern is persistent daily headache, 4 out of 10, bilateral frontal, no focal signs  He used to be a truck driver, now on short short-term disability  Update 10/05/2022 Kyle Mccarthy: Patient returns for follow-up visit regarding concerns of memory loss after prior initial visit with Dr. Gracie Mccarthy 5 months ago.  He is unaccompanied.  Full work-up completed including MRI and EEG which were unremarkable.  Lab work for reversible causes did show  low B12 at 219 and initiated on supplementation.    He believes his memory has continued to worsen since prior visit.  He has been having more issues remembering his grandchildren's names or have a hesitation when saying their name.  He continues to struggle with clear fluent speech and at times can lose his train of thought which is understandably frustrating for him.  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 He feels like he has a relatively consistent brain fog.  At times, can feel like he zones out but thankfully this has never occurred while driving.  He does report continued nightly use of CPAP.  Consult visit 05/12/2022 Dr. Gracie Mccarthy: Kyle Mccarthy, is a 68 year old male seen in request by his primary care PA Fredick Jarred, for evaluation of memory loss, initial evaluation was on May 12, 2022 with Dr. Gracie Mccarthy   I reviewed and summarized the referring note. PMHX. HTN Obesity Anxiety HLD BPH. 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," to avoid staying at home ", he noticed gradual onset of memory loss since 2018, word finding difficulties, transient confusion, he also reported to episode of sudden onset of visual hallucinations, while he was driving, he thought he saw a Paediatric nurse accident, while in reality there was no accident,  Since he was treated for obstructive sleep apnea, compliant with the CPAP machine, he overall felt much better, no longer has hallucinations, denies excessive daytime sleepiness,  fatigue   He has 3 children, one of his sons does suffer complex partial seizure, he denies family history of epilepsy, he denies difficulty driving,      .  ROS:   14 system review of systems performed and negative with exception of those listed in HPI  OBJECTIVE:  Physical Exam  Today's Vitals   01/03/24 1447  BP: (!) 166/67  Pulse: (!) 41  Weight: 264 lb (119.7 kg)  Height: 5\' 10"  (1.778 m)   Body mass index is 37.88 kg/m.   PHYSICAL EXAMNIATION:  Gen: NAD, very pleasant Caucasian male, conversant, well nourised, well groomed                     Cardiovascular: Regular rate rhythm, no peripheral edema, warm, nontender. Eyes: Conjunctivae clear without exudates or hemorrhage Neck: Supple, no carotid bruits. Pulmonary: Clear to auscultation bilaterally   NEUROLOGICAL EXAM:  MENTAL STATUS: Speech/cognition: Awake, alert oriented to history taking and casual conversation  CRANIAL NERVES: CN II: Visual fields are full to confrontation.  Pupils are round equal and briskly reactive to light. CN III, IV, VI: extraocular movement are normal. No ptosis. CN V: Facial sensation is intact to pinprick in all 3 divisions bilaterally. Corneal responses are intact.  CN VII: Face is symmetric with normal eye closure and smile. CN VIII: Hearing is normal to casual conversation CN IX, X: Palate elevates symmetrically. Phonation is normal. CN XI: Head turning and shoulder shrug are intact CN XII: Tongue is midline with normal movements and no atrophy.  Narrow oropharyngeal space,  MOTOR: There is no pronator drift of out-stretched arms. Muscle bulk and tone are normal. Muscle strength is normal.  REFLEXES: Reflexes are 1 and symmetric at the biceps, triceps, knees, and ankles. Plantar responses are flexor.  SENSORY: Intact to light touch,   COORDINATION: Rapid alternating movements and fine finger movements are intact. There is no dysmetria on finger-to-nose and  heel-knee-shin.    GAIT/STANCE: Posture is normal. Gait is steady       PMH:  Past Medical History:  Diagnosis Date   Anxiety    Depression    Hx of blood clots    left leg years ago   Hypertension    Sleep apnea     PSH:  Past Surgical History:  Procedure Laterality Date   EXTRACORPOREAL SHOCK WAVE LITHOTRIPSY Left 03/15/2022   Procedure: EXTRACORPOREAL SHOCK WAVE LITHOTRIPSY (ESWL);  Surgeon: Mellie Sprinkle., MD;  Location: [facility];  Service: Urology;  Laterality: Left;   EXTRACORPOREAL SHOCK WAVE LITHOTRIPSY Right 04/05/2022   Procedure: EXTRACORPOREAL SHOCK WAVE LITHOTRIPSY (ESWL);  Surgeon: Marco Severs, MD;  Location: [facility];  Service: Urology;  Laterality: Right;   EYE SURGERY     cateract surgery 20 yrs ago   SKIN CANCER EXCISION      Social History:  Social History   Socioeconomic History   Marital status: Divorced  Spouse name: Not on file   Number of children: 3   Years of education: Not on file   Highest education level: Some college, no degree  Occupational History   Not on file  Tobacco Use   Smoking status: Never   Smokeless tobacco: Never  Vaping Use   Vaping status: Never Used  Substance and Sexual Activity   Alcohol use: Yes    Alcohol/week: 2.0 standard drinks of alcohol    Types: 2 Cans of beer per week    Comment: occassional   Drug use: Never   Sexual activity: Not Currently  Other Topics Concern   Not on file  Social History Narrative   Not on file   Social Drivers of Health   Financial Resource Strain: Not on file  Food Insecurity: No Food Insecurity (06/05/2023)   Hunger Vital Sign    Worried About Running Out of Food in the Last Year: Never true    Ran Out of Food in the Last Year: Never true  Transportation Needs: No Transportation Needs (06/05/2023)   PRAPARE - Administrator, Civil Service (Medical): No    Lack of Transportation (Non-Medical): No  Physical Activity: Not on file  Stress: Not on file   Social Connections: Not on file  Intimate Partner Violence: Not At Risk (06/05/2023)   Humiliation, Afraid, Rape, and Kick questionnaire    Fear of Current or Ex-Partner: No    Emotionally Abused: No    Physically Abused: No    Sexually Abused: No    Family History:  Family History  Problem Relation Age of Onset   Alcohol abuse Maternal Uncle    Post-traumatic stress disorder Son     Medications:   Current Outpatient Medications on File Prior to Visit  Medication Sig Dispense Refill   atorvastatin (LIPITOR) 80 MG tablet Take 80 mg by mouth daily.     cyclobenzaprine  (FLEXERIL ) 5 MG tablet Take 1 tablet (5 mg total) by mouth 2 (two) times daily as needed for muscle spasms. 30 tablet 1   doxycycline  (VIBRAMYCIN ) 100 MG capsule Take 1 capsule (100 mg total) by mouth every 12 (twelve) hours. 56 capsule 0   lacosamide  100 MG TABS Take 1 tablet (100 mg total) by mouth 2 (two) times daily. 180 tablet 3   omeprazole (PRILOSEC) 20 MG capsule Take 20 mg by mouth daily.     omeprazole (PRILOSEC) 40 MG capsule Take 40 mg by mouth daily.     propranolol ER (INDERAL LA) 80 MG 24 hr capsule Take 80 mg by mouth daily.     QUEtiapine (SEROQUEL) 100 MG tablet Take 100 mg by mouth at bedtime.     sertraline  (ZOLOFT ) 100 MG tablet Take 1 tablet (100 mg total) by mouth daily. 90 tablet 1   tamsulosin  (FLOMAX ) 0.4 MG CAPS capsule Take 1 capsule (0.4 mg total) by mouth daily after supper. 30 capsule 11   XARELTO 20 MG TABS tablet Take 20 mg by mouth daily.     zolpidem  (AMBIEN ) 10 MG tablet Take 1 tablet (10 mg total) by mouth at bedtime as needed for sleep. 30 tablet 0   No current facility-administered medications on file prior to visit.    Allergies:   Allergies  Allergen Reactions   No Known Allergies     I spent a prolonged 45 minutes of face-to-face and non-face-to-face time with patient.  This included previsit chart review, lab review, study review, order entry, electronic health record  documentation, patient education and discussion regarding above diagnoses and treatment plan and answered all other questions to patient's satisfaction  Johny Nap, [facility]  [facility] [address] [place], [place] [address]  Phone [phone] Fax [phone] Note: This document was prepared with digital dictation and possible smart phrase technology. Any transcriptional errors that result from this process are unintentional.

## 2024-01-02 NOTE — Patient Instructions (Signed)

## 2024-01-03 ENCOUNTER — Ambulatory Visit (INDEPENDENT_AMBULATORY_CARE_PROVIDER_SITE_OTHER): Payer: Self-pay | Admitting: Adult Health

## 2024-01-03 ENCOUNTER — Encounter: Payer: Self-pay | Admitting: Adult Health

## 2024-01-03 VITALS — BP 166/67 | HR 41 | Ht 70.0 in | Wt 264.0 lb

## 2024-01-03 DIAGNOSIS — G3184 Mild cognitive impairment, so stated: Secondary | ICD-10-CM | POA: Diagnosis not present

## 2024-01-03 DIAGNOSIS — G43711 Chronic migraine without aura, intractable, with status migrainosus: Secondary | ICD-10-CM

## 2024-01-03 DIAGNOSIS — G40209 Localization-related (focal) (partial) symptomatic epilepsy and epileptic syndromes with complex partial seizures, not intractable, without status epilepticus: Secondary | ICD-10-CM | POA: Diagnosis not present

## 2024-01-03 DIAGNOSIS — G4733 Obstructive sleep apnea (adult) (pediatric): Secondary | ICD-10-CM | POA: Diagnosis not present

## 2024-01-03 MED ORDER — AJOVY 225 MG/1.5ML ~~LOC~~ SOAJ: 225.0000 mg | SUBCUTANEOUS | 11 refills | Status: DC

## 2024-01-03 NOTE — Patient Instructions (Addendum)
 Your Plan:  Start Ajovy  monthly injection for migraine prevention   Continue lacosamide  100 mg twice daily for seizure prevention - please let me know if you have any additional seizures  Continue nightly use of CPAP for adequate sleep apnea management Discuss starting Zepbound with your pulmonologist or PCP which can help with weight loss and now approved for those with sleep apnea  Ensure routine physical and cognitive activities as well as ensuring healthy diet, adequate water intake and good sleep hygiene     Follow up in 6 months or call earlier if needed      Thank you for coming to see us  at [facility]. I hope we have been able to provide you high quality care today.  You may receive a patient satisfaction survey over the next few weeks. We would appreciate your feedback and comments so that we may continue to improve ourselves and the health of our patients.

## 2024-01-04 ENCOUNTER — Telehealth: Payer: Self-pay | Admitting: Primary Care

## 2024-01-04 NOTE — Telephone Encounter (Signed)
Patient spoke with neurologist and they suggested that we prescribe the patient Zepbound for his sleep apnea and weight management. Please call and advise.

## 2024-01-10 NOTE — Telephone Encounter (Signed)
We are not prescribing weight loss medication, PCP will need to right now

## 2024-01-10 NOTE — Telephone Encounter (Signed)
I called and spoke with the pt. Pt states his neurologist suggested that we prescribe the patient Zepbound for his sleep apnea and weight management. Pt was last seen on 12-28-23 by Beth, Np. Please advise.

## 2024-01-12 ENCOUNTER — Telehealth: Payer: Self-pay | Admitting: Primary Care

## 2024-01-12 NOTE — Telephone Encounter (Signed)
Please let patient know I received compliance report from his CPAP.  Everything looked good. He is 100% compliant with CPAP use. Current pressure settings auto 4-20 without significant residual apneas.  No changes recommended.  Continue to wear CPAP nightly

## 2024-01-23 NOTE — Telephone Encounter (Signed)
I called and spoke to the pt. I informed him of Beth's note. Pt verbalized understanding. NFN.

## 2024-01-23 NOTE — Telephone Encounter (Signed)
I called and spoke with pt. Pt notified of Beth's note. Pt verbalized understanding. NFN

## 2024-02-23 IMAGING — MR MR ABDOMEN WO/W CM
20 series · 48 of 48 positions shown · IV contrast (gadavist)
Comparison: CT abdomen and pelvis 03/09/2022, renal ultrasound
05/12/2022

CLINICAL DATA: Hepatic and renal mass evaluation.

EXAM:
MRI ABDOMEN WITHOUT AND WITH CONTRAST
TECHNIQUE: Multiplanar multisequence MR imaging of the abdomen was performed
both before and after the administration of intravenous contrast.
CONTRAST:  10mL GADAVIST GADOBUTROL 1 MMOL/ML IV SOLN

[Series 3: T2 fat-sat · axial · 6.0mm · 1.41mm/px · 1 of 42 slices shown]
[im 1/42]
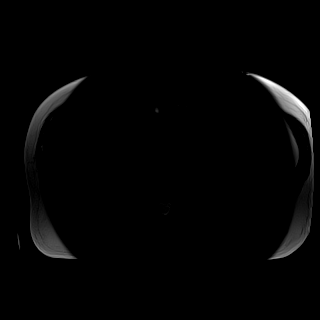

[Series 5: DWI · axial · 6.0mm · 1.68mm/px · 1 of 82 slices shown (1 of 2)]
[im 1/82]
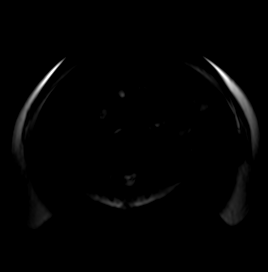

[Series 6: DWI · axial · 6.0mm · 1.68mm/px · 1 of 41 slices shown (2 of 2)]
[im 1/41]
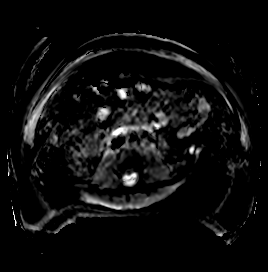

[Series 8: T2 · coronal · 6.0mm · 1.56mm/px · 1 of 52 slices shown (1 of 2)]
[im 1/52]
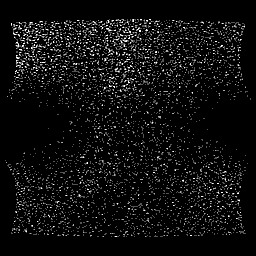

[Series 9: T1 · axial · 3.5mm · 1.41mm/px · z∈[-90,+215]mm · 3 of 88 slices shown (1 of 2)]
[im 1/88]
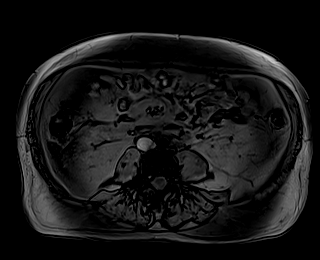
[im 44/88]
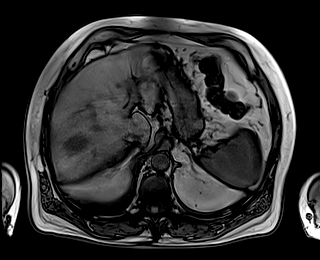
[im 88/88]
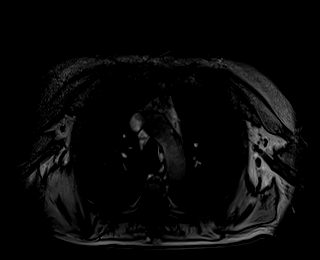

[Series 10: T1 · axial · 3.5mm · 1.41mm/px · z∈[-90,+215]mm · 3 of 88 slices shown (2 of 2)]
[im 1/88]
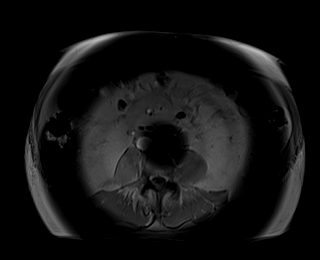
[im 44/88]
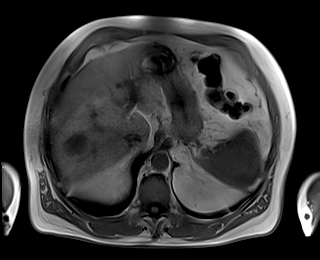
[im 88/88]
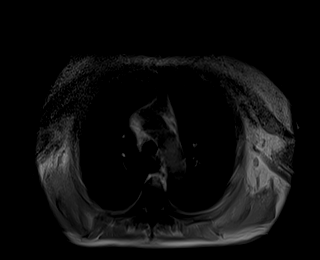

[Series 11: bSSFP · axial · 4.0mm · 0.88mm/px · z∈[-87,+201]mm · 2 of 73 slices shown]
[im 1/73]
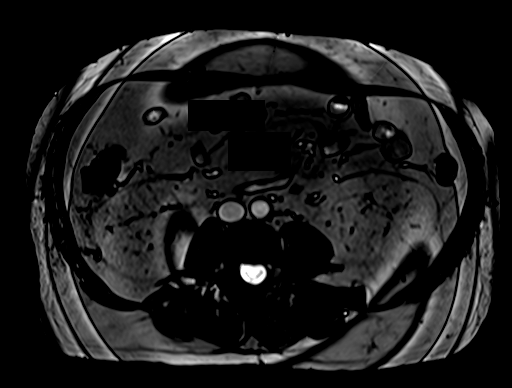
[im 73/73]
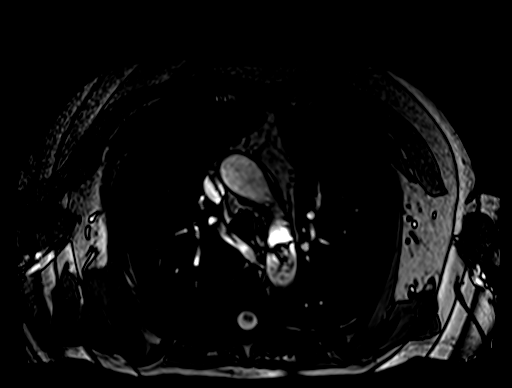

[Series 13: T1 dynamic · axial · 3.0mm · 1.41mm/px · z∈[-82,+203]mm · 3 of 96 slices shown (1 of 12)]
[im 1/96]
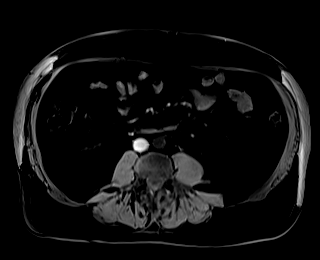
[im 48/96]
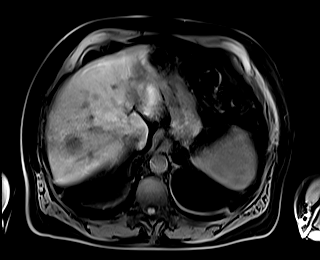
[im 96/96]
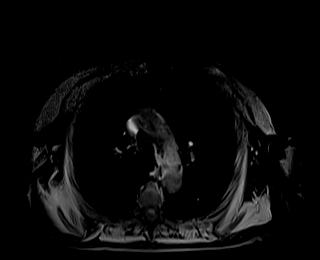

[Series 17: T1 dynamic · axial · 3.0mm · 1.41mm/px · z∈[-82,+203]mm · 3 of 96 slices shown (2 of 12)]
[im 1/96]
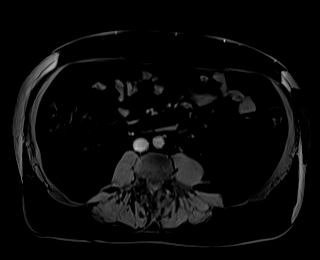
[im 48/96]
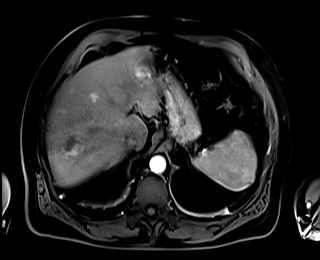
[im 96/96]
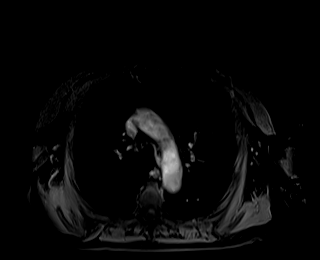

[Series 18: T1 dynamic · axial · 3.0mm · 1.41mm/px · z∈[-82,+203]mm · 3 of 96 slices shown (3 of 12)]
[im 1/96]
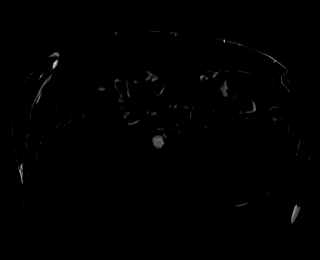
[im 48/96]
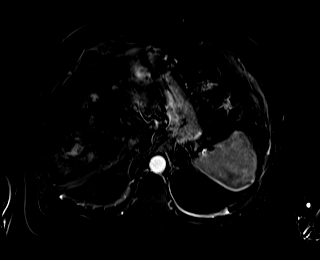
[im 96/96]
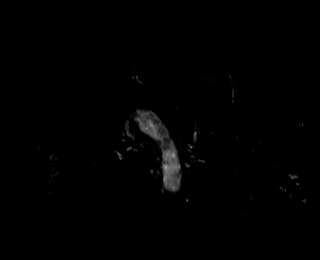

[Series 21: T1 dynamic · axial · 3.0mm · 1.41mm/px · z∈[-82,+203]mm · 3 of 96 slices shown (4 of 12)]
[im 1/96]
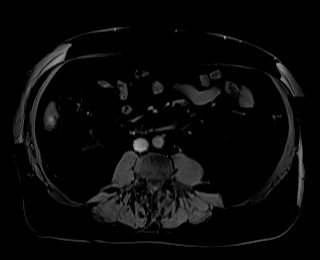
[im 48/96]
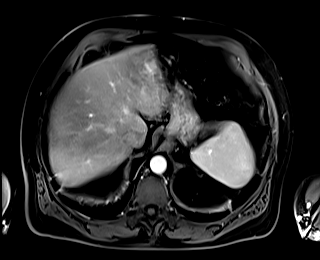
[im 96/96]
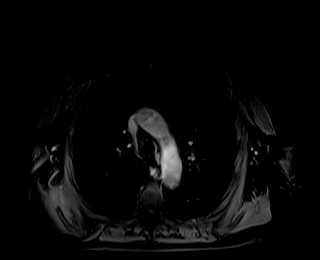

[Series 22: T1 dynamic · axial · 3.0mm · 1.41mm/px · z∈[-82,+203]mm · 3 of 96 slices shown (5 of 12)]
[im 1/96]
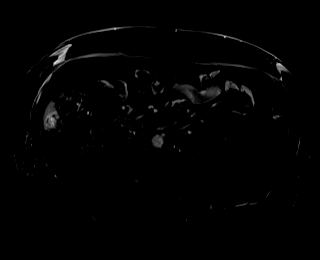
[im 48/96]
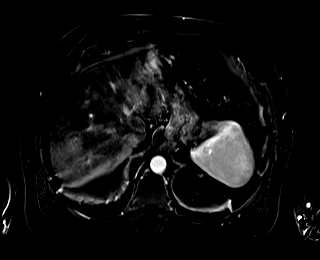
[im 96/96]
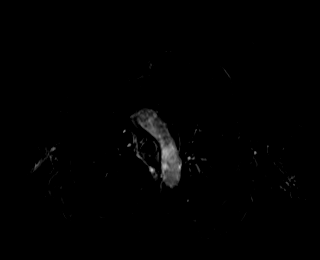

[Series 25: T1 dynamic · axial · 3.0mm · 1.41mm/px · z∈[-82,+203]mm · 3 of 96 slices shown (6 of 12)]
[im 1/96]
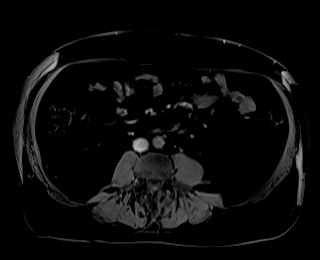
[im 48/96]
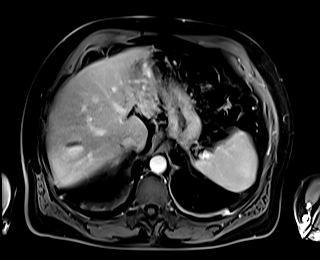
[im 96/96]
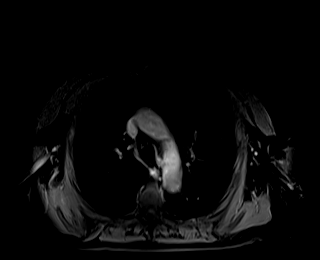

[Series 26: T1 dynamic · axial · 3.0mm · 1.41mm/px · z∈[-82,+203]mm · 3 of 96 slices shown (7 of 12)]
[im 1/96]
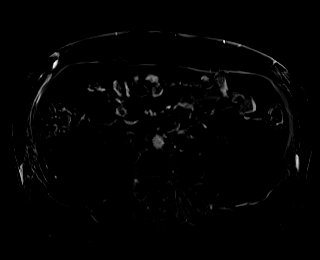
[im 48/96]
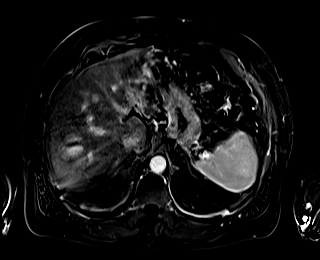
[im 96/96]
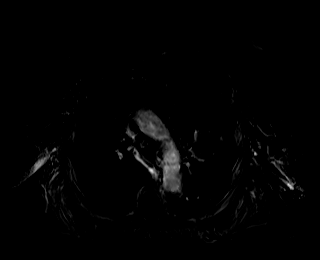

[Series 28: T1 dynamic · coronal · 5.0mm · 1.41mm/px · 2 of 64 slices shown (8 of 12)]
[im 1/64]
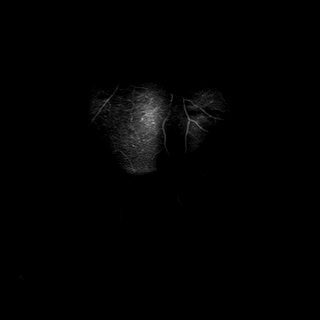
[im 64/64]
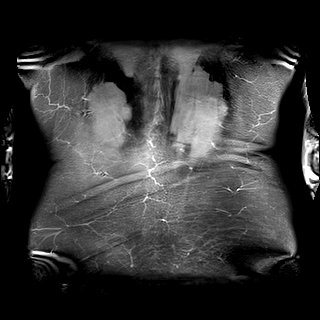

[Series 29: T2 · axial · 6.0mm · 1.76mm/px · 1 of 41 slices shown (2 of 2)]
[im 1/41]
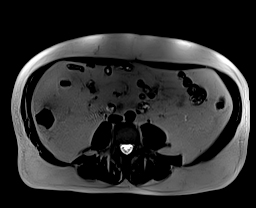

[Series 32: T1 dynamic · axial · 3.0mm · 1.41mm/px · z∈[-82,+203]mm · 3 of 96 slices shown (9 of 12)]
[im 1/96]
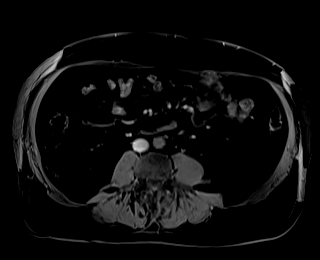
[im 48/96]
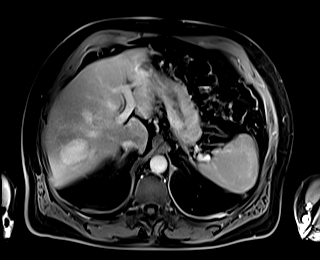
[im 96/96]
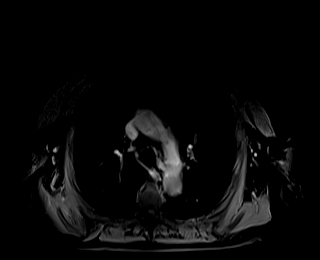

[Series 33: T1 dynamic · axial · 3.0mm · 1.41mm/px · z∈[-82,+203]mm · 3 of 96 slices shown (10 of 12)]
[im 1/96]
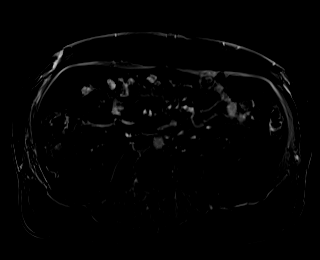
[im 48/96]
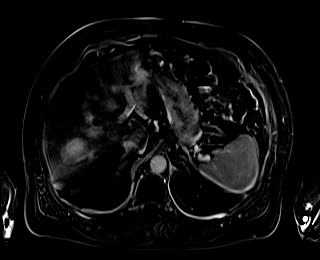
[im 96/96]
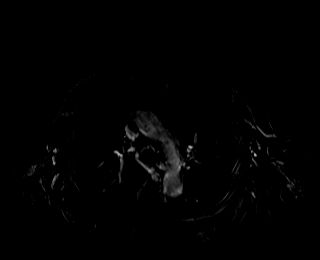

[Series 36: T1 dynamic · axial · 3.0mm · 1.41mm/px · z∈[-82,+203]mm · 3 of 96 slices shown (11 of 12)]
[im 1/96]
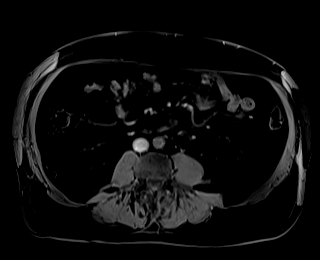
[im 48/96]
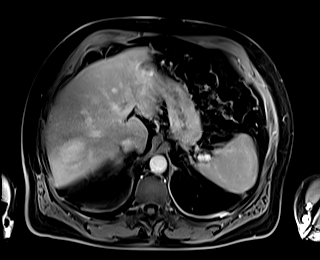
[im 96/96]
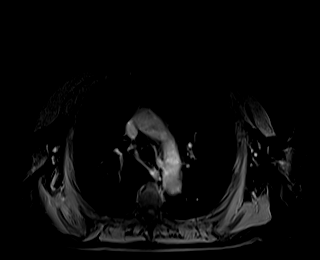

[Series 37: T1 dynamic · axial · 3.0mm · 1.41mm/px · z∈[-82,+203]mm · 3 of 96 slices shown (12 of 12)]
[im 1/96]
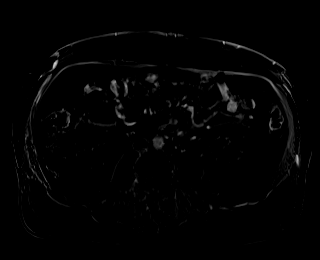
[im 48/96]
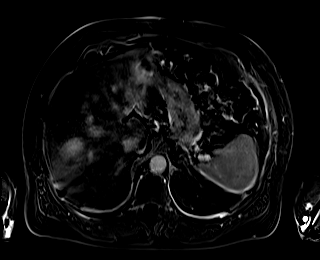
[im 96/96]
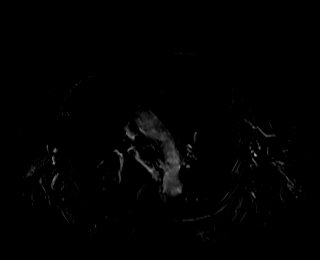

[48 of 48 positions shown; findings below may reference images not displayed]

FINDINGS: Study is significantly limited due to motion.

Lower chest: Cardiomegaly.  No acute process identified.

Hepatobiliary: Liver is normal in size in contour. There are
multiple (approximately 6) similar-appearing slightly lobulated
hyperintense T2 signal masses identified in the right hepatic lobe,
which demonstrate postcontrast enhancing characteristics consistent
with hemangiomas. Largest is in the posterior right hepatic lobe
measuring 4.5 x 2.9 cm. Cholelithiasis. No gallbladder wall
thickening or surrounding fluid. No biliary ductal dilatation.

Pancreas: No mass, inflammatory changes, or other parenchymal
abnormality identified.

Spleen:  Within normal limits in size and appearance.

Adrenals/Urinary Tract: Adrenal glands appear normal. No suspicious
enhancing renal mass or hydronephrosis identified bilaterally. There
is a 1.8 cm cyst at the upper pole right kidney and a 2.5 x 1.4 cm
hyperintense T1 signal lesion with hypointense T2 hemosiderin rim at
the lateral aspect of the right kidney which does not demonstrate
enhancement, consistent with chronic hematoma.

Stomach/Bowel: Colonic diverticulosis. No evidence of bowel
obstruction.

Vascular/Lymphatic: No pathologically enlarged lymph nodes
identified. No abdominal aortic aneurysm demonstrated.

Other:  No ascites

Musculoskeletal: No suspicious bone lesions identified.
IMPRESSION: 1. Multiple hepatic hemangiomas.
2. Right renal cyst in the upper pole and chronic hematoma at the
lateral right kidney.
3. Colonic diverticulosis.
4. Cholelithiasis.
5. Cardiomegaly.
6. Limited study due to motion.

## 2024-03-14 ENCOUNTER — Ambulatory Visit: Payer: 59 | Admitting: Psychology

## 2024-03-22 ENCOUNTER — Other Ambulatory Visit (HOSPITAL_COMMUNITY): Payer: Self-pay | Admitting: Family Medicine

## 2024-03-22 DIAGNOSIS — R1011 Right upper quadrant pain: Secondary | ICD-10-CM

## 2024-03-22 DIAGNOSIS — M79605 Pain in left leg: Secondary | ICD-10-CM

## 2024-03-28 ENCOUNTER — Ambulatory Visit (HOSPITAL_COMMUNITY)
Admission: RE | Admit: 2024-03-28 | Discharge: 2024-03-28 | Disposition: A | Discharge status: Home / Self Care | Source: Ambulatory Visit | Attending: Family Medicine | Admitting: Family Medicine

## 2024-03-28 DIAGNOSIS — R1011 Right upper quadrant pain: Secondary | ICD-10-CM

## 2024-03-28 DIAGNOSIS — M79605 Pain in left leg: Secondary | ICD-10-CM | POA: Diagnosis present

## 2024-05-03 ENCOUNTER — Telehealth: Payer: Self-pay | Admitting: Adult Health

## 2024-05-03 ENCOUNTER — Other Ambulatory Visit: Payer: Self-pay

## 2024-05-03 MED ORDER — LACOSAMIDE 100 MG PO TABS: 100.0000 mg | ORAL_TABLET | Freq: Two times a day (BID) | ORAL | 0 refills | Status: DC

## 2024-05-03 NOTE — Telephone Encounter (Signed)
 Pt called stating that his prescribing physician will nol longer fill his lacosamide  100 MG TABS and that he was informed to call his Neurologist. Pt would like to know if provider can take over his lacosamide  100 MG TABS refills. Pt is completely out and has had to quit cold [place] the other day. Please advise.

## 2024-05-03 NOTE — Telephone Encounter (Signed)
 Cld Pt to make him aware we sent refill to [facility] in [place], [place] and as soon as our on call provider signs the pharmacy should be able to fill it. Pt stated great thanks.

## 2024-05-03 NOTE — Addendum Note (Signed)
 Addended by: Marianna Cid K on: 05/03/2024 12:14 PM   Modules accepted: Orders

## 2024-05-03 NOTE — Telephone Encounter (Signed)
 Attempted to call Pt regarding lacosamide  refill and to verify pharmacy. No answer, LVM for call back or send [facility] message with preferred pharmacy.

## 2024-05-05 MED ORDER — LACOSAMIDE 100 MG PO TABS: 100.0000 mg | ORAL_TABLET | Freq: Two times a day (BID) | ORAL | 0 refills | Status: DC

## 2024-05-06 ENCOUNTER — Telehealth: Payer: Self-pay | Admitting: Adult Health

## 2024-05-06 NOTE — Telephone Encounter (Signed)
 Pharmacy called in regards to Pt medication order. Pharmacy stated order was missing a Code . Transfer to Nurse  Lacosamide  100 MG TABS

## 2024-05-06 NOTE — Telephone Encounter (Signed)
 Was able to take the call and provided the ICD code for the pt

## 2024-05-30 ENCOUNTER — Encounter: Payer: Self-pay | Admitting: Psychology

## 2024-06-28 ENCOUNTER — Ambulatory Visit: Payer: Medicare Other | Admitting: Urology

## 2024-06-28 DIAGNOSIS — N200 Calculus of kidney: Secondary | ICD-10-CM

## 2024-07-01 ENCOUNTER — Encounter: Payer: Self-pay | Admitting: Urology

## 2024-07-01 ENCOUNTER — Ambulatory Visit (HOSPITAL_COMMUNITY)
Admission: RE | Admit: 2024-07-01 | Discharge: 2024-07-01 | Disposition: A | Discharge status: Home / Self Care | Source: Ambulatory Visit | Attending: Urology | Admitting: Urology

## 2024-07-01 ENCOUNTER — Telehealth: Payer: Self-pay | Admitting: Urology

## 2024-07-01 ENCOUNTER — Ambulatory Visit (INDEPENDENT_AMBULATORY_CARE_PROVIDER_SITE_OTHER): Admitting: Urology

## 2024-07-01 VITALS — BP 160/82 | HR 56

## 2024-07-01 DIAGNOSIS — N200 Calculus of kidney: Secondary | ICD-10-CM | POA: Diagnosis not present

## 2024-07-01 DIAGNOSIS — R351 Nocturia: Secondary | ICD-10-CM

## 2024-07-01 DIAGNOSIS — N401 Enlarged prostate with lower urinary tract symptoms: Secondary | ICD-10-CM

## 2024-07-01 DIAGNOSIS — N138 Other obstructive and reflux uropathy: Secondary | ICD-10-CM

## 2024-07-01 MED ORDER — TAMSULOSIN HCL 0.4 MG PO CAPS: 0.4000 mg | ORAL_CAPSULE | Freq: Every day | ORAL | 11 refills | Status: DC

## 2024-07-01 NOTE — Progress Notes (Signed)
 07/01/2024 3:41 PM   Kyle Mccarthy 02-Jan-1956 [id number]  Referring provider: Dow Longs, PA-C [address] [place],  [place] [address]  Followup nephrolithiasis   HPI: Kyle Mccarthy is a 68yo here for followup for BPH with LUTS, nocturia and nephrolithiasis. KUB from today shows a stable 5mm left renal calculus. No stone events since last visit. IPSS 13 QOl 2 on flomax  0.4mg  daily. Urine stream strong. Nocturia 0-1x depending on fluid consumption. No straining to urinate   PMH: Past Medical History:  Diagnosis Date   Anxiety    Depression    Hx of blood clots    left leg years ago   Hypertension    Sleep apnea     Surgical History: Past Surgical History:  Procedure Laterality Date   EXTRACORPOREAL SHOCK WAVE LITHOTRIPSY Left 03/15/2022   Procedure: EXTRACORPOREAL SHOCK WAVE LITHOTRIPSY (ESWL);  Surgeon: Roseann Adine PARAS., MD;  Location: [facility];  Service: Urology;  Laterality: Left;   EXTRACORPOREAL SHOCK WAVE LITHOTRIPSY Right 04/05/2022   Procedure: EXTRACORPOREAL SHOCK WAVE LITHOTRIPSY (ESWL);  Surgeon: Sherrilee Belvie CROME, MD;  Location: [facility];  Service: Urology;  Laterality: Right;   EYE SURGERY     cateract surgery 20 yrs ago   SKIN CANCER EXCISION      Home Medications:  Allergies as of 07/01/2024       Reactions   No Known Allergies         Medication List        Accurate as of July 01, 2024  3:41 PM. If you have any questions, ask your nurse or doctor.          Ajovy  225 MG/1.5ML Soaj Generic drug: Fremanezumab -vfrm Inject 225 mg into the skin every 30 (thirty) days.   atorvastatin 80 MG tablet Commonly known as: LIPITOR Take 80 mg by mouth daily.   cyclobenzaprine  5 MG tablet Commonly known as: FLEXERIL  Take 1 tablet (5 mg total) by mouth 2 (two) times daily as needed for muscle spasms.   doxycycline  100 MG capsule Commonly known as: VIBRAMYCIN  Take 1 capsule (100 mg total) by mouth every 12 (twelve) hours.   Lacosamide  100 MG  Tabs Take 1 tablet (100 mg total) by mouth 2 (two) times daily.   omeprazole 20 MG capsule Commonly known as: PRILOSEC Take 20 mg by mouth daily.   omeprazole 40 MG capsule Commonly known as: PRILOSEC Take 40 mg by mouth daily.   propranolol ER 80 MG 24 hr capsule Commonly known as: INDERAL LA Take 80 mg by mouth daily.   QUEtiapine 100 MG tablet Commonly known as: SEROQUEL Take 100 mg by mouth at bedtime.   sertraline  100 MG tablet Commonly known as: ZOLOFT  Take 1 tablet (100 mg total) by mouth daily.   tamsulosin  0.4 MG Caps capsule Commonly known as: FLOMAX  Take 1 capsule (0.4 mg total) by mouth daily after supper.   Xarelto 20 MG Tabs tablet Generic drug: rivaroxaban Take 20 mg by mouth daily.   zolpidem  10 MG tablet Commonly known as: AMBIEN  Take 1 tablet (10 mg total) by mouth at bedtime as needed for sleep.        Allergies:  Allergies  Allergen Reactions   No Known Allergies     Family History: Family History  Problem Relation Age of Onset   Alcohol abuse Maternal Uncle    Post-traumatic stress disorder Son     Social History:  reports that he has never smoked. He has never used smokeless tobacco.  He reports current alcohol use of about 2.0 standard drinks of alcohol per week. He reports that he does not use drugs.  ROS: All other review of systems were reviewed and are negative except what is noted above in HPI  Physical Exam: BP (!) 160/82   Pulse (!) 56   Constitutional:  Alert and oriented, No acute distress. HEENT: Jessie AT, moist mucus membranes.  Trachea midline, no masses. Cardiovascular: No clubbing, cyanosis, or edema. Respiratory: Normal respiratory effort, no increased work of breathing. GI: Abdomen is soft, nontender, nondistended, no abdominal masses GU: No CVA tenderness.  Lymph: No cervical or inguinal lymphadenopathy. Skin: No rashes, bruises or suspicious lesions. Neurologic: Grossly intact, no focal deficits, moving all 4  extremities. Psychiatric: Normal mood and affect.  Laboratory Data: Lab Results  Component Value Date   WBC 5.4 06/06/2023   HGB 12.7 (L) 06/06/2023   HCT 37.8 (L) 06/06/2023   MCV 95.5 06/06/2023   PLT 128 (L) 06/06/2023    Lab Results  Component Value Date   CREATININE 1.12 06/06/2023    No results found for: PSA  No results found for: TESTOSTERONE  Lab Results  Component Value Date   HGBA1C 5.2 06/05/2023    Urinalysis    Component Value Date/Time   COLORURINE YELLOW 06/04/2023 1838   APPEARANCEUR Clear 12/22/2023 1205   LABSPEC 1.017 06/04/2023 1838   PHURINE 5.0 06/04/2023 1838   GLUCOSEU Negative 12/22/2023 1205   HGBUR NEGATIVE 06/04/2023 1838   BILIRUBINUR Negative 12/22/2023 1205   KETONESUR NEGATIVE 06/04/2023 1838   PROTEINUR Negative 12/22/2023 1205   PROTEINUR NEGATIVE 06/04/2023 1838   NITRITE Negative 12/22/2023 1205   NITRITE NEGATIVE 06/04/2023 1838   LEUKOCYTESUR Negative 12/22/2023 1205   LEUKOCYTESUR NEGATIVE 06/04/2023 1838    Lab Results  Component Value Date   LABMICR Comment 12/22/2023   WBCUA 0-5 06/01/2022   LABEPIT None seen 06/01/2022   MUCUS Many (A) 04/20/2022   BACTERIA None seen 06/01/2022    Pertinent Imaging: KUB today: Images reviewed and discussed with the patient  Results for orders placed during the hospital encounter of 12/22/23  DG Abd 1 View  Narrative CLINICAL DATA:  Kidney stones.  EXAM: ABDOMEN - 1 VIEW  COMPARISON:  Radiograph 05/17/2023.  FINDINGS: 4 mm calcification projects over the mid left renal shadow. No other evidence of urolithiasis. Nonobstructive bowel gas pattern with generalized paucity of bowel gas. Small volume of formed stool in the colon.  IMPRESSION: Suspected 4 mm left renal calculus.   Electronically Signed By: Andrea Gasman M.D. On: 12/30/2023 12:26  No results found for this or any previous visit.  No results found for this or any previous visit.  No results  found for this or any previous visit.  Results for orders placed during the hospital encounter of 04/19/23  Ultrasound renal complete  Narrative CLINICAL DATA:  Nephrolithiasis  EXAM: RENAL / URINARY TRACT ULTRASOUND COMPLETE  COMPARISON:  Renal ultrasound 10/18/2022  FINDINGS: Right Kidney:  Renal measurements: 11.0 x 5.1 x 6.0 cm = volume: 176.8 mL. The renal cortical thickness and echogenicity. No hydronephrosis. There is a simple appearing 3.5 cm cyst superior pole right kidney. Opening follow-up needed.  Left Kidney:  Renal measurements: 12.5 x 6.3 x 5.6 cm = volume: 229.7 mL. Normal renal cortical thickness and echogenicity. No hydronephrosis. There is a 15 mm probable stone midpole left kidney.  Bladder:  Appears normal for degree of bladder distention.  Other:  None.  IMPRESSION: 1. No  hydronephrosis. 2. Left nephrolithiasis.   Electronically Signed By: Bard Moats M.D. On: 04/21/2023 06:42  No results found for this or any previous visit.  No results found for this or any previous visit.  Results for orders placed in visit on 03/09/22  CT RENAL STONE STUDY  Narrative CLINICAL DATA:  Flank pain, history of nephrolithiasis, left lower quadrant pain for 1 week  EXAM: CT ABDOMEN AND PELVIS WITHOUT CONTRAST  TECHNIQUE: Multidetector CT imaging of the abdomen and pelvis was performed following the standard protocol without IV contrast.  RADIATION DOSE REDUCTION: This exam was performed according to the departmental dose-optimization program which includes automated exposure control, adjustment of the mA and/or kV according to patient size and/or use of iterative reconstruction technique.  COMPARISON:  None available  FINDINGS: Lower chest: Scattered areas of bibasilar atelectasis. Subcentimeter calcified granuloma in the left lower lobe. Mild cardiac enlargement. No pericardial or pleural effusion.  Hepatobiliary: Limited without IV  contrast. Round 4.4 cm hypodense lesion in the right liver posteriorly has relatively low Hounsfield measurements, image 17/2, but remains indeterminate by noncontrast imaging. This could be further evaluated with abdominal ultrasound to determine if the lesion is solid or cystic. There is an additional ill-defined subcapsular hypodense lesion more inferior in the right liver measuring 2.1 cm, image 27/2.  No biliary dilatation or obstruction pattern.  Cholelithiasis noted. Gallbladder nondistended. Common bile duct nondilated.  Pancreas: Unremarkable. No pancreatic ductal dilatation or surrounding inflammatory changes.  Spleen: Normal in size without focal abnormality.  Adrenals/Urinary Tract: Normal adrenal glands.  Kidneys demonstrate tiny punctate nonobstructing intrarenal calculi bilaterally. No acute hydronephrosis. Ureters are symmetric and decompressed.  However, the left distal ureter does demonstrate a nonobstructing 5 mm calculus, image 73/2 in the pelvis very close to the left UVJ.  Bladder unremarkable.  Stomach/Bowel: Negative for bowel obstruction, significant dilatation, ileus, or free air. Normal appendix demonstrated. Scattered left colon and sigmoid diverticulosis without acute inflammatory process.  No free fluid, fluid collection, hemorrhage, hematoma, abscess or ascites.  Vascular/Lymphatic: Limited without IV contrast. Aorta atherosclerotic. Negative for aneurysm. No retroperitoneal hemorrhage or hematoma.  No bulky adenopathy.  Nonspecific small nonenlarged mesenteric root lymph nodes with slight mesenteric haziness, image 34/2. This can be seen with mesenteric adenitis/panniculitis.  Reproductive: Prostate gland is mildly enlarged. Seminal vesicles are symmetric. Prostate calcifications noted.  Other: No abdominal wall hernia or abnormality. No abdominopelvic ascites.  Musculoskeletal: Degenerative changes throughout the spine.  Lower lumbar facet arthropathy. L5-S1 advanced degenerative disc disease noted. No acute osseous finding.  IMPRESSION: 5 mm nonobstructing left distal ureteral calculus close to the left UVJ.  Additional nonobstructing punctate intrarenal calculi bilaterally.  Right liver indeterminate hypodense lesions measuring up to 4.4 cm. Consider initial evaluation with nonemergent follow-up abdominal ultrasound to determine if solid or cystic.  Cholelithiasis  Aortic Atherosclerosis (ICD10-I70.0).   Electronically Signed By: CHRISTELLA.  Shick M.D. On: 03/09/2022 10:37   Assessment & Plan:    1. Kidney stones (Primary) Followuop 1 year with KUB - Urinalysis, Routine w reflex microscopic  2. Nocturia Continue flomax  0.4mg  daily  3. BPh with LUTS -contineu flomax  0.4mg  daily   No follow-ups on file.  Belvie Clara, MD  [facility] [place]

## 2024-07-01 NOTE — Telephone Encounter (Signed)
 Patient had appt Friday and had an accident on the way. He would like to see McKenzie sooner than November

## 2024-07-01 NOTE — Telephone Encounter (Signed)
 Called pt to let him know we had a 3:30 AM openign today if he could make that appt. Pt confirmed and stated he would go get his xray prior to appt

## 2024-07-01 NOTE — Patient Instructions (Signed)

## 2024-07-02 LAB — URINALYSIS, ROUTINE W REFLEX MICROSCOPIC
Bilirubin, UA: NEGATIVE
Glucose, UA: NEGATIVE
Ketones, UA: NEGATIVE
Leukocytes,UA: NEGATIVE
Nitrite, UA: NEGATIVE
Protein,UA: NEGATIVE
RBC, UA: NEGATIVE
Specific Gravity, UA: 1.02 (ref 1.005–1.030)
Urobilinogen, Ur: 0.2 mg/dL (ref 0.2–1.0)
pH, UA: 6 (ref 5.0–7.5)

## 2024-07-05 ENCOUNTER — Telehealth: Payer: Self-pay | Admitting: Adult Health

## 2024-07-05 NOTE — Telephone Encounter (Signed)
 Appointment details confirmed

## 2024-07-09 ENCOUNTER — Ambulatory Visit (INDEPENDENT_AMBULATORY_CARE_PROVIDER_SITE_OTHER): Payer: Medicare Other | Admitting: Adult Health

## 2024-07-09 ENCOUNTER — Encounter: Payer: Self-pay | Admitting: Adult Health

## 2024-07-09 VITALS — BP 141/76 | HR 87 | Ht 70.0 in | Wt 260.0 lb

## 2024-07-09 DIAGNOSIS — G40209 Localization-related (focal) (partial) symptomatic epilepsy and epileptic syndromes with complex partial seizures, not intractable, without status epilepticus: Secondary | ICD-10-CM

## 2024-07-09 DIAGNOSIS — G3184 Mild cognitive impairment, so stated: Secondary | ICD-10-CM | POA: Diagnosis not present

## 2024-07-09 DIAGNOSIS — F439 Reaction to severe stress, unspecified: Secondary | ICD-10-CM | POA: Diagnosis not present

## 2024-07-09 DIAGNOSIS — R519 Headache, unspecified: Secondary | ICD-10-CM | POA: Diagnosis not present

## 2024-07-09 NOTE — Patient Instructions (Addendum)
 Your Plan:  Would recommend rescheduling follow up visit with Dr. Corina to discuss re-evaluation   Continue Vimpat  100mg  twice daily for seizure prevention   Recommend starting magnesium oxide 400mg  daily and riboflavin (B2) 200mg  twice daily which can both help with migraines and mood   Would highly recommend routine use of your hearing aides as this can help with your memory difficulties as well the ringing in your ears      Follow up in 6 months with Dr. Onita or call earlier if needed        Thank you for coming to see us  at [facility]. I hope we have been able to provide you high quality care today.  You may receive a patient satisfaction survey over the next few weeks. We would appreciate your feedback and comments so that we may continue to improve ourselves and the health of our patients.

## 2024-07-09 NOTE — Progress Notes (Signed)
 [facility] [address] [place]. [place] [address] [phone]     Chief Complaint  Patient presents with   Seizures    Rm 8 alone Pt is well, reports he has a constant daily headache . No seizures he is aware of since last visit. Does mention memory has declined.       ASSESSMENT/PLAN:  Kyle Mccarthy is a 68 y.o. year old male   Mild cognitive Impairment In the setting of anxiety, heightened stress at home  MMSE 22/30 (prior 27/30) Suspect memory loss complaints multifactoral in setting of multiple comorbidities, hearing loss, OSA, underlying mood disorder, significant stressors, daily headaches, and polypharmacy Discussed importance of nightly compliance with CPAP therapy Continue to follow with PCP for depression management, consider reevaluation by psychiatry but will defer to PCP Reiterated importance of daily use of hearing aids, discussed hearing loss can contribute to short-term memory difficulties and can also help tinnitus             MRI of the brain 05/2022 and 05/2023 chronic microvascular ischemia and volume loss  EEG 05/2022 and 05/2023 normal             Laboratory evaluation showed low B12 level at 219, repeat level was within normal limit 663.  Neuropsychology evaluation in February 2024 not consistent with any type of degenerative process and did not meet criteria for diagnosis of major neurocognitive disorder or dementia  Encouraged rescheduling follow-up visit with Dr. Corina   Daily headaches  Previously mixed features of tension and migrainous type, current daily headaches more consistent with tension type likely due to significant increase stress  Recommend starting magnesium oxide 400 mg daily and riboflavin 200 mg twice daily  Previously recommended initiating Ajovy  but due to memory and short term difficulties, question if he would be able to adequately self administer every 30 days.   Continue propranolol ER 80 mg daily per  PCP, unable to increase dose further as HR already low  Previously tried/failed: Flexeril , lacosamide , propranolol, Seroquel, sertraline    Complex partial seizure on June 04, 2023  No additional seizure activity  Continue Vimpat  100 mg twice a day - rx currently up to date, due for refill 08/06/2024  Advised to call with any recurrent seizures  Routine labs by PCP   OSA on CPAP  Discussed importance of continued nightly usage  Currently managed by pulmonology       Follow-up in 6 months with Dr. Onita for routine follow up or call earlier if needed      HPI:   Update 07/09/2024 JM: Patient returns for 86-month follow-up visit unaccompanied.  He feels that his cognition has continued to decline primarily in regards to short-term memory.  He does endorse quite significant stressors at home, his son lives with him and his sons friend over the past year who unfortunately do not help him around the house which greatly upsets him and becomes tearful regarding this.  He continues to maintain ADLs and IADLs independently as well as driving.  He enjoys doing things around his home such as gardening and taking care of his chickens.  He did have follow-up visit scheduled with Dr. Corina back in March and June but he canceled these visits, he is not able to give a specific reason and questions if he should follow up with him. He has continued to experience quite frequent headaches.  He never started on Ajovy  as previously recommended, appears no PA was requested.  He does  remain on propranolol 80 mg daily.  Currently describes headache as more of a tight band pressure sensation that is constant throughout the day and worsens with increased stress, fatigue or prolonged conversation.  Reports constant tinnitus, has not been using hearing aides. Continued difficulty with hearing but at times he feels he struggles more with processing of information. During prolonged visit today, he would easily lose  train of thought but eventually get back on track.  He does remain on Seroquel, sertraline  and Ambien  as needed for mood.  He also remains on lacosamide  and denies any seizure activity.      History provided for reference purposes only Update 01/03/2024 JM: Patient returns for follow-up visit unaccompanied.   His main concern today is in regards to persistent memory difficulties and daily headaches.  Believes memory has continued to decline since prior visit.  He has speech hesitancy and delayed recall. MMSE today 27/30 (prior 21/30).  Able to maintain ADLs and IADLs independently.  He continues to drive without difficulty but is now retired.  Reports daily headaches which can fluctuate in severity, can also experience brain zaps sensation.  Can have photophobia and phonophobia with more severe headaches.  He is currently on propranolol ER 80 mg daily.  He does admit to significant stressors which he feels could be contributing.  He does have underlying depression and currently on sertraline  and Seroquel, previously followed by psychiatry but now managed by PCP, he does continue to struggle with depression symptoms.  Reports nightly use of CPAP, does have difficulty falling asleep but this is helped by Ambien .  CPAP managed by pulmonology.  He also complains of persistent tinnitus which can be overly bothersome, he has been prescribed hearing aids by audiology but does not wear them consistently.  He questions medication to help with weight loss, previously on Wegovy with benefit but no longer covered by insurance.  He remains on lacosamide  100 mg twice daily, no additional seizure activity that he is aware of.    UPDATE July 10th 2024 Dr. Onita: Partial seizure prior hospital admission on June 04, 2023, he came back playing golf with his 2 sons, sitting at the den, woke up at the hospital, I was able to talk with his son [name] who has weakness that episode, he suddenly had right arm jerking movement,  lasting for few minutes followed by postevent confusion  Personally reviewed MRI of brain on June 06 2023: 1. No acute intracranial abnormality. 2. Findings of chronic microvascular ischemia and volume loss.  EEG by Dr. Shelton on June 05, 2023 was normal  He was discharged with Vimpat  50 mg twice a day, tolerating it well, retired,  He is seen by pulmonologist for obstructive sleep apnea, auto CPAP,   UPDATE April 11 2023 Dr. Onita: He was seen by Harlene in October 2023, continue complains of worsening memory loss, also had diagnosis of obstructive sleep apnea, had the home study around 2023 by his primary care, but setting was not adjusted since, per patient, but he was not so confident because of his memory loss  He is very frustrated, finding more difficulty expressing himself, today his main concern is persistent daily headache, 4 out of 10, bilateral frontal, no focal signs  He used to be a truck driver, now on short short-term disability  Update 10/05/2022 JM: Patient returns for follow-up visit regarding concerns of memory loss after prior initial visit with Dr. Onita 5 months ago.  He is unaccompanied.  Full work-up completed  including MRI and EEG which were unremarkable.  Lab work for reversible causes did show low B12 at 219 and initiated on supplementation.    He believes his memory has continued to worsen since prior visit.  He has been having more issues remembering his grandchildren's names or have a hesitation when saying their name.  He continues to struggle with clear fluent speech and at times can lose his train of thought which is understandably frustrating for him.  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 He feels like he has a relatively consistent brain fog.  At times, can feel like he zones out but  thankfully this has never occurred while driving.  He does report continued nightly use of CPAP.  Consult visit 05/12/2022 Dr. Onita: Olando Willems, is a 68 year old male seen in request by his primary care PA Dow Longs, for evaluation of memory loss, initial evaluation was on May 12, 2022 with Dr. Onita   I reviewed and summarized the referring note. PMHX. HTN Obesity Anxiety HLD BPH. 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, to avoid staying at home , he noticed gradual onset of memory loss since 2018, word finding difficulties, transient confusion, he also reported to episode of sudden onset of visual hallucinations, while he was driving, he thought he saw a Paediatric nurse accident, while in reality there was no accident,  Since he was treated for obstructive sleep apnea, compliant with the CPAP machine, he overall felt much better, no longer has hallucinations, denies excessive daytime sleepiness, fatigue   He has 3 children, one of his sons does suffer complex partial seizure, he denies family history of epilepsy, he denies difficulty driving,      .  ROS:   14 system review of systems performed and negative with exception of those listed in HPI  OBJECTIVE:  Physical Exam  Today's Vitals   07/09/24 1506  BP: (!) 141/76  Pulse: 87  Weight: 260 lb (117.9 kg)  Height: 5' 10 (1.778 m)   Body mass index is 37.31 kg/m.  PHYSICAL EXAMNIATION:  Gen: NAD, very pleasant Caucasian male, conversant, well nourised, well groomed, easily tearful during visit                   Cardiovascular: Regular rate rhythm, no peripheral edema, warm, nontender. Eyes: Conjunctivae  clear without exudates or hemorrhage Neck: Supple, no carotid bruits. Pulmonary: Clear to auscultation bilaterally   NEUROLOGICAL EXAM:  MENTAL STATUS: Speech/cognition: Awake, alert oriented to history taking and casual conversation but easily lose train of thought  CRANIAL NERVES: CN II: Visual fields are full to confrontation.  Pupils are round equal and briskly reactive to light. CN III, IV, VI: extraocular movement are normal. No ptosis. CN V: Facial sensation is intact to pinprick in all 3 divisions bilaterally. Corneal responses are intact.  CN VII: Face is symmetric with normal eye closure and smile. CN VIII: Hearing is normal to casual conversation CN IX, X: Palate elevates  symmetrically. Phonation is normal. CN XI: Head turning and shoulder shrug are intact CN XII: Tongue is midline with normal movements and no atrophy.  Narrow oropharyngeal space,  MOTOR: There is no pronator drift of out-stretched arms. Muscle bulk and tone are normal. Muscle strength is normal.  REFLEXES: Reflexes are 1 and symmetric at the biceps, triceps, knees, and ankles. Plantar responses are flexor.  SENSORY: Intact to light touch,   COORDINATION: Rapid alternating movements and fine finger movements are intact. There is no dysmetria on finger-to-nose and heel-knee-shin.    GAIT/STANCE: Posture is normal. Gait is steady        07/09/2024    3:14 PM 01/03/2024    2:56 PM 10/05/2022    8:49 AM  MMSE - Mini Mental State Exam  Orientation to time 3 4 2   Orientation to Place 4 4 4   Registration 3 3 3   Attention/ Calculation 1 5 4   Recall 3 2 2   Language- name 2 objects 2 2 2   Language- repeat 1 1 0  Language- follow 3 step command 3 3 2   Language- read & follow direction 1 1 1   Write a sentence 1 1 1   Copy design 0 1 0  Total score 22 27 21        PMH:  Past Medical History:  Diagnosis Date   Anxiety    Depression    Hx of blood clots    left leg years ago   Hypertension     Sleep apnea     PSH:  Past Surgical History:  Procedure Laterality Date   EXTRACORPOREAL SHOCK WAVE LITHOTRIPSY Left 03/15/2022   Procedure: EXTRACORPOREAL SHOCK WAVE LITHOTRIPSY (ESWL);  Surgeon: Roseann Adine PARAS., MD;  Location: [facility];  Service: Urology;  Laterality: Left;   EXTRACORPOREAL SHOCK WAVE LITHOTRIPSY Right 04/05/2022   Procedure: EXTRACORPOREAL SHOCK WAVE LITHOTRIPSY (ESWL);  Surgeon: Sherrilee Belvie CROME, MD;  Location: [facility];  Service: Urology;  Laterality: Right;   EYE SURGERY     cateract surgery 20 yrs ago   SKIN CANCER EXCISION      Social History:  Social History   Socioeconomic History   Marital status: Divorced    Spouse name: Not on file   Number of children: 3   Years of education: Not on file   Highest education level: Some college, no degree  Occupational History   Not on file  Tobacco Use   Smoking status: Never   Smokeless tobacco: Never  Vaping Use   Vaping status: Never Used  Substance and Sexual Activity   Alcohol use: Yes    Alcohol/week: 2.0 standard drinks of alcohol    Types: 2 Cans of beer per week    Comment: occassional   Drug use: Never   Sexual activity: Not Currently  Other Topics Concern   Not on file  Social History Narrative   Not on file   Social Drivers of Health   Financial Resource Strain: Not on file  Food Insecurity: No Food Insecurity (06/05/2023)   Hunger Vital Sign    Worried About Running Out of Food in the Last Year: Never true    Ran Out of Food in the Last Year: Never true  Transportation Needs: No Transportation Needs (06/05/2023)   PRAPARE - Administrator, Civil Service (Medical): No    Lack of Transportation (Non-Medical): No  Physical Activity: Not on file  Stress: Not on file  Social Connections: Not on file  Intimate Partner Violence:  Not At Risk (06/05/2023)   Humiliation, Afraid, Rape, and Kick questionnaire    Fear of Current or Ex-Partner: No    Emotionally Abused: No     Physically Abused: No    Sexually Abused: No    Family History:  Family History  Problem Relation Age of Onset   Alcohol abuse Maternal Uncle    Post-traumatic stress disorder Son     Medications:   Current Outpatient Medications on File Prior to Visit  Medication Sig Dispense Refill   atorvastatin (LIPITOR) 80 MG tablet Take 80 mg by mouth daily.     cyclobenzaprine  (FLEXERIL ) 5 MG tablet Take 1 tablet (5 mg total) by mouth 2 (two) times daily as needed for muscle spasms. 30 tablet 1   doxycycline  (VIBRAMYCIN ) 100 MG capsule Take 1 capsule (100 mg total) by mouth every 12 (twelve) hours. 56 capsule 0   Lacosamide  100 MG TABS Take 1 tablet (100 mg total) by mouth 2 (two) times daily. 180 tablet 0   omeprazole (PRILOSEC) 20 MG capsule Take 20 mg by mouth daily.     propranolol ER (INDERAL LA) 80 MG 24 hr capsule Take 80 mg by mouth daily.     QUEtiapine (SEROQUEL) 100 MG tablet Take 100 mg by mouth at bedtime.     sertraline  (ZOLOFT ) 100 MG tablet Take 1 tablet (100 mg total) by mouth daily. 90 tablet 1   tamsulosin  (FLOMAX ) 0.4 MG CAPS capsule Take 1 capsule (0.4 mg total) by mouth daily after supper. 30 capsule 11   XARELTO 20 MG TABS tablet Take 20 mg by mouth daily.     zolpidem  (AMBIEN ) 10 MG tablet Take 1 tablet (10 mg total) by mouth at bedtime as needed for sleep. 30 tablet 0   No current facility-administered medications on file prior to visit.    Allergies:   Allergies  Allergen Reactions   No Known Allergies     I personally spent a total of 50 minutes in the care of the patient today including preparing to see the patient, performing a medically appropriate exam/evaluation, counseling and educating, placing orders, documenting clinical information in the EHR, and prolonged conversation as noted in HPI.   Harlene Bogaert, AGNP-BC  [facility] [address] [place], [place] [address]  Phone [phone] Fax [phone] Note: This  document was prepared with digital dictation and possible smart phrase technology. Any transcriptional errors that result from this process are unintentional.

## 2024-08-20 ENCOUNTER — Other Ambulatory Visit: Payer: Self-pay | Admitting: Neurology

## 2024-08-21 NOTE — Telephone Encounter (Signed)
 Requested Prescriptions   Pending Prescriptions Disp Refills   Lacosamide  100 MG TABS [Pharmacy Med Name: LACOSAMIDE  100 MG TABLET] 180 tablet 0    Sig: TAKE 1 TABLET BY MOUTH TWICE A DAY   Last seen 07/09/24 Next appt 01/14/25  Dispenses   Dispensed Days Supply Quantity Provider Pharmacy  LACOSAMIDE  100 MG TABLET 05/06/2024 90 180 each Ines Onetha NOVAK, MD [facility] [phone] - [facility]...  LACOSAMIDE  100 MG TABLET 01/08/2024 90 180 each Dow Longs, PA-C [facility] [phone] - [facility]...  LACOSAMIDE  100 MG TABLET 09/26/2023 90 180 each Onita Duos, MD [facility] [phone] - [email or web address]

## 2024-09-03 ENCOUNTER — Telehealth: Payer: Self-pay

## 2024-09-03 ENCOUNTER — Telehealth: Payer: Self-pay | Admitting: Urology

## 2024-09-03 ENCOUNTER — Ambulatory Visit (HOSPITAL_COMMUNITY)
Admission: RE | Admit: 2024-09-03 | Discharge: 2024-09-03 | Disposition: A | Discharge status: Home / Self Care | Source: Ambulatory Visit | Attending: Urology | Admitting: Urology

## 2024-09-03 DIAGNOSIS — N200 Calculus of kidney: Secondary | ICD-10-CM

## 2024-09-03 NOTE — Telephone Encounter (Signed)
 Open in error

## 2024-09-03 NOTE — Telephone Encounter (Signed)
 Patient called with no answer. Detailed message left. Order placed for KUB.

## 2024-09-03 NOTE — Telephone Encounter (Signed)
 Patient returned call stating this kidney stone feels different from the others. He states he is having more hematuria usually after activity like mowing the yard and the pain is more like a dull ache in his kidneys.  Patient states he will go have KUB done around lunch.

## 2024-09-03 NOTE — Telephone Encounter (Signed)
-----   Message from Belvie Clara sent at 09/03/2024  8:39 AM EDT ----- He needs to see me and get a KUB ----- Message ----- From: Gretta Carlos SAUNDERS, CMA Sent: 09/03/2024   8:23 AM EDT To: Belvie LITTIE Clara, MD  Please review and advised. Pt in pain

## 2024-09-03 NOTE — Telephone Encounter (Signed)
 Has kidney stones and is having pain. CT scanned from [facility]

## 2024-09-05 ENCOUNTER — Telehealth: Payer: Self-pay

## 2024-09-05 NOTE — Telephone Encounter (Signed)
 Patient calling back for results of most recent imaging.  Please advise.  Call:  [phone]

## 2024-09-05 NOTE — Telephone Encounter (Signed)
 Return call to pt. Patient is made aware imaging has been routed to MD, [facility] for review and once review someone will reach out with MD recommendation.

## 2024-09-06 ENCOUNTER — Telehealth: Payer: Self-pay | Admitting: Urology

## 2024-09-06 ENCOUNTER — Other Ambulatory Visit: Payer: Self-pay

## 2024-09-06 ENCOUNTER — Other Ambulatory Visit: Payer: Self-pay | Admitting: Urology

## 2024-09-06 DIAGNOSIS — N200 Calculus of kidney: Secondary | ICD-10-CM

## 2024-09-06 MED ORDER — OXYCODONE-ACETAMINOPHEN 5-325 MG PO TABS: 1.0000 | ORAL_TABLET | ORAL | 0 refills | Status: DC | PRN

## 2024-09-06 MED ORDER — ONDANSETRON HCL 4 MG PO TABS: 4.0000 mg | ORAL_TABLET | Freq: Every day | ORAL | 1 refills | Status: DC | PRN

## 2024-09-06 NOTE — Telephone Encounter (Signed)
 Return call to pt and made him aware per Dr. Sherrilee will send him medication for nausea and abdominal pain. Pt verbalized understanding.

## 2024-09-06 NOTE — Telephone Encounter (Signed)
 Return call to pt concerning nausea and abdominal pain. Pt state's he is on the way to [facility] due to the nausea and abdominal pain. Patient state's he would like to ESWL for stone removal. Pt is made aware a message will be sent to Dr. Sherrilee and Veleria to get him scheduled for surgery. Pt verbalized understanding

## 2024-09-06 NOTE — Telephone Encounter (Signed)
 Patient called into the office today with general questions/concerns regarding kidney stones. Son states he is having nausea and abdominal pain Patient may be reached at [phone] to discuss questions.

## 2024-09-09 ENCOUNTER — Encounter (HOSPITAL_COMMUNITY)
Admission: RE | Admit: 2024-09-09 | Discharge: 2024-09-09 | Disposition: A | Discharge status: Home / Self Care | Source: Ambulatory Visit | Attending: Urology | Admitting: Urology

## 2024-09-09 ENCOUNTER — Other Ambulatory Visit: Payer: Self-pay

## 2024-09-09 NOTE — Telephone Encounter (Signed)
 I just spoke to patient, he didn't mention any questions.  He did say that he will be in for his litho packet.  I can address questions at that time.

## 2024-09-09 NOTE — Telephone Encounter (Signed)
 Patient called into the office today with general questions/concerns regarding litho. He wants to know if he will be staying over night. He wants to know how long the procedure will take. Also if all of the stones will be taken out. Patient may be reached at [phone] to discuss questions.

## 2024-09-10 ENCOUNTER — Encounter (HOSPITAL_COMMUNITY)
Admission: RE | Disposition: A | Discharge status: Home / Self Care | Payer: Self-pay | Source: Home / Self Care | Attending: Urology

## 2024-09-10 ENCOUNTER — Encounter (HOSPITAL_COMMUNITY): Payer: Self-pay | Admitting: Urology

## 2024-09-10 ENCOUNTER — Ambulatory Visit (HOSPITAL_COMMUNITY)
Admission: RE | Admit: 2024-09-10 | Discharge: 2024-09-10 | Disposition: A | Discharge status: Home / Self Care | Attending: Urology | Admitting: Urology

## 2024-09-10 ENCOUNTER — Ambulatory Visit (HOSPITAL_COMMUNITY)

## 2024-09-10 DIAGNOSIS — N201 Calculus of ureter: Secondary | ICD-10-CM | POA: Diagnosis present

## 2024-09-10 DIAGNOSIS — N202 Calculus of kidney with calculus of ureter: Secondary | ICD-10-CM | POA: Insufficient documentation

## 2024-09-10 HISTORY — PX: EXTRACORPOREAL SHOCK WAVE LITHOTRIPSY: SHX1557

## 2024-09-10 SURGERY — LITHOTRIPSY, ESWL
Anesthesia: LOCAL | Laterality: Left

## 2024-09-10 MED ORDER — DIPHENHYDRAMINE HCL 25 MG PO CAPS
ORAL_CAPSULE | ORAL | Status: AC
  Administered 2024-09-10: 25 mg via ORAL
  Filled 2024-09-10: qty 1

## 2024-09-10 MED ORDER — DIPHENHYDRAMINE HCL 25 MG PO CAPS
25.0000 mg | ORAL_CAPSULE | ORAL | Status: AC
Start: 2024-09-10 — End: 2024-09-10

## 2024-09-10 MED ORDER — ONDANSETRON HCL 4 MG PO TABS: 4.0000 mg | ORAL_TABLET | Freq: Every day | ORAL | 1 refills | Status: DC | PRN

## 2024-09-10 MED ORDER — OXYCODONE-ACETAMINOPHEN 5-325 MG PO TABS: 1.0000 | ORAL_TABLET | ORAL | 0 refills | Status: DC | PRN

## 2024-09-10 MED ORDER — SODIUM CHLORIDE 0.9 % IV SOLN: INTRAVENOUS | Status: DC

## 2024-09-10 MED ORDER — DIAZEPAM 5 MG PO TABS: 10.0000 mg | ORAL_TABLET | Freq: Once | ORAL | Status: AC

## 2024-09-10 MED ORDER — DIAZEPAM 5 MG PO TABS
ORAL_TABLET | ORAL | Status: AC
  Administered 2024-09-10: 10 mg via ORAL
  Filled 2024-09-10: qty 2

## 2024-09-10 NOTE — H&P (Signed)
 HPI: [name] is a 68yo here for followup for BPH with LUTS, nocturia and nephrolithiasis. KUB from today shows a stable 5mm left renal calculus. No stone events since last visit. IPSS 13 QOl 2 on flomax  0.4mg  daily. Urine stream strong. Nocturia 0-1x depending on fluid consumption. No straining to urinate     PMH:     Past Medical History:  Diagnosis Date   Anxiety     Depression     Hx of blood clots      left leg years ago   Hypertension     Sleep apnea            Surgical History:      Past Surgical History:  Procedure Laterality Date   EXTRACORPOREAL SHOCK WAVE LITHOTRIPSY Left 03/15/2022    Procedure: EXTRACORPOREAL SHOCK WAVE LITHOTRIPSY (ESWL);  Surgeon: Roseann Adine PARAS., MD;  Location: [facility];  Service: Urology;  Laterality: Left;   EXTRACORPOREAL SHOCK WAVE LITHOTRIPSY Right 04/05/2022    Procedure: EXTRACORPOREAL SHOCK WAVE LITHOTRIPSY (ESWL);  Surgeon: Sherrilee Belvie CROME, MD;  Location: [facility];  Service: Urology;  Laterality: Right;   EYE SURGERY        cateract surgery 20 yrs ago   SKIN CANCER EXCISION              Home Medications:  Allergies as of 07/01/2024         Reactions    No Known Allergies              Medication List           Accurate as of July 01, 2024  3:41 PM. If you have any questions, ask your nurse or doctor.              Ajovy  225 MG/1.5ML Soaj Generic drug: Fremanezumab -vfrm Inject 225 mg into the skin every 30 (thirty) days.    atorvastatin 80 MG tablet Commonly known as: LIPITOR Take 80 mg by mouth daily.    cyclobenzaprine  5 MG tablet Commonly known as: FLEXERIL  Take 1 tablet (5 mg total) by mouth 2 (two) times daily as needed for muscle spasms.    doxycycline  100 MG capsule Commonly known as: VIBRAMYCIN  Take 1 capsule (100 mg total) by mouth every 12 (twelve) hours.    Lacosamide  100 MG Tabs Take 1 tablet (100 mg total) by mouth 2 (two) times daily.    omeprazole 20 MG capsule Commonly known as:  PRILOSEC Take 20 mg by mouth daily.    omeprazole 40 MG capsule Commonly known as: PRILOSEC Take 40 mg by mouth daily.    propranolol ER 80 MG 24 hr capsule Commonly known as: INDERAL LA Take 80 mg by mouth daily.    QUEtiapine 100 MG tablet Commonly known as: SEROQUEL Take 100 mg by mouth at bedtime.    sertraline  100 MG tablet Commonly known as: ZOLOFT  Take 1 tablet (100 mg total) by mouth daily.    tamsulosin  0.4 MG Caps capsule Commonly known as: FLOMAX  Take 1 capsule (0.4 mg total) by mouth daily after supper.    Xarelto 20 MG Tabs tablet Generic drug: rivaroxaban Take 20 mg by mouth daily.    zolpidem  10 MG tablet Commonly known as: AMBIEN  Take 1 tablet (10 mg total) by mouth at bedtime as needed for sleep.             Allergies:  Allergies      Allergies  Allergen Reactions   No Known Allergies  Family History:      Family History  Problem Relation Age of Onset   Alcohol abuse Maternal Uncle     Post-traumatic stress disorder Son            Social History:  reports that he has never smoked. He has never used smokeless tobacco. He reports current alcohol use of about 2.0 standard drinks of alcohol per week. He reports that he does not use drugs.   ROS: All other review of systems were reviewed and are negative except what is noted above in HPI   Physical Exam: BP (!) 160/82   Pulse (!) 56   Constitutional:  Alert and oriented, No acute distress. HEENT: [place] AT, moist mucus membranes.  Trachea midline, no masses. Cardiovascular: No clubbing, cyanosis, or edema. Respiratory: Normal respiratory effort, no increased work of breathing. GI: Abdomen is soft, nontender, nondistended, no abdominal masses GU: No CVA tenderness.  Lymph: No cervical or inguinal lymphadenopathy. Skin: No rashes, bruises or suspicious lesions. Neurologic: Grossly intact, no focal deficits, moving all 4 extremities. Psychiatric: Normal mood and affect.    Laboratory Data: Recent Labs       Lab Results  Component Value Date    WBC 5.4 06/06/2023    HGB 12.7 (L) 06/06/2023    HCT 37.8 (L) 06/06/2023    MCV 95.5 06/06/2023    PLT 128 (L) 06/06/2023        Recent Labs       Lab Results  Component Value Date    CREATININE 1.12 06/06/2023        Recent Labs  No results found for: PSA     Recent Labs  No results found for: TESTOSTERONE     Recent Labs       Lab Results  Component Value Date    HGBA1C 5.2 06/05/2023        Urinalysis Labs (Brief)          Component Value Date/Time    COLORURINE YELLOW 06/04/2023 1838    APPEARANCEUR Clear 12/22/2023 1205    LABSPEC 1.017 06/04/2023 1838    PHURINE 5.0 06/04/2023 1838    GLUCOSEU Negative 12/22/2023 1205    HGBUR NEGATIVE 06/04/2023 1838    BILIRUBINUR Negative 12/22/2023 1205    KETONESUR NEGATIVE 06/04/2023 1838    PROTEINUR Negative 12/22/2023 1205    PROTEINUR NEGATIVE 06/04/2023 1838    NITRITE Negative 12/22/2023 1205    NITRITE NEGATIVE 06/04/2023 1838    LEUKOCYTESUR Negative 12/22/2023 1205    LEUKOCYTESUR NEGATIVE 06/04/2023 1838        Recent Labs       Lab Results  Component Value Date    LABMICR Comment 12/22/2023    WBCUA 0-5 06/01/2022    LABEPIT None seen 06/01/2022    MUCUS Many (A) 04/20/2022    BACTERIA None seen 06/01/2022        Pertinent Imaging: KUB today: Images reviewed and discussed with the patient  Results for orders placed during the hospital encounter of 12/22/23   DG Abd 1 View   Narrative CLINICAL DATA:  Kidney stones.   EXAM: ABDOMEN - 1 VIEW   COMPARISON:  Radiograph 05/17/2023.   FINDINGS: 4 mm calcification projects over the mid left renal shadow. No other evidence of urolithiasis. Nonobstructive bowel gas pattern with generalized paucity of bowel gas. Small volume of formed stool in the colon.   IMPRESSION: Suspected 4 mm left renal calculus.     Electronically Signed By: Andrea  Sanford  M.D. On: 12/30/2023 12:26   No results found for this or any previous visit.   No results found for this or any previous visit.   No results found for this or any previous visit.   Results for orders placed during the hospital encounter of 04/19/23   Ultrasound renal complete   Narrative CLINICAL DATA:  Nephrolithiasis   EXAM: RENAL / URINARY TRACT ULTRASOUND COMPLETE   COMPARISON:  Renal ultrasound 10/18/2022   FINDINGS: Right Kidney:   Renal measurements: 11.0 x 5.1 x 6.0 cm = volume: 176.8 mL. The renal cortical thickness and echogenicity. No hydronephrosis. There is a simple appearing 3.5 cm cyst superior pole right kidney. Opening follow-up needed.   Left Kidney:   Renal measurements: 12.5 x 6.3 x 5.6 cm = volume: 229.7 mL. Normal renal cortical thickness and echogenicity. No hydronephrosis. There is a 15 mm probable stone midpole left kidney.   Bladder:   Appears normal for degree of bladder distention.   Other:   None.   IMPRESSION: 1. No hydronephrosis. 2. Left nephrolithiasis.     Electronically Signed By: Bard Moats M.D. On: 04/21/2023 06:42   No results found for this or any previous visit.   No results found for this or any previous visit.   Results for orders placed in visit on 03/09/22   CT RENAL STONE STUDY   Narrative CLINICAL DATA:  Flank pain, history of nephrolithiasis, left lower quadrant pain for 1 week   EXAM: CT ABDOMEN AND PELVIS WITHOUT CONTRAST   TECHNIQUE: Multidetector CT imaging of the abdomen and pelvis was performed following the standard protocol without IV contrast.   RADIATION DOSE REDUCTION: This exam was performed according to the departmental dose-optimization program which includes automated exposure control, adjustment of the mA and/or kV according to patient size and/or use of iterative reconstruction technique.   COMPARISON:  None available   FINDINGS: Lower chest: Scattered areas of bibasilar  atelectasis. Subcentimeter calcified granuloma in the left lower lobe. Mild cardiac enlargement. No pericardial or pleural effusion.   Hepatobiliary: Limited without IV contrast. Round 4.4 cm hypodense lesion in the right liver posteriorly has relatively low Hounsfield measurements, image 17/2, but remains indeterminate by noncontrast imaging. This could be further evaluated with abdominal ultrasound to determine if the lesion is solid or cystic. There is an additional ill-defined subcapsular hypodense lesion more inferior in the right liver measuring 2.1 cm, image 27/2.   No biliary dilatation or obstruction pattern.   Cholelithiasis noted. Gallbladder nondistended. Common bile duct nondilated.   Pancreas: Unremarkable. No pancreatic ductal dilatation or surrounding inflammatory changes.   Spleen: Normal in size without focal abnormality.   Adrenals/Urinary Tract: Normal adrenal glands.   Kidneys demonstrate tiny punctate nonobstructing intrarenal calculi bilaterally. No acute hydronephrosis. Ureters are symmetric and decompressed.   However, the left distal ureter does demonstrate a nonobstructing 5 mm calculus, image 73/2 in the pelvis very close to the left UVJ.   Bladder unremarkable.   Stomach/Bowel: Negative for bowel obstruction, significant dilatation, ileus, or free air. Normal appendix demonstrated. Scattered left colon and sigmoid diverticulosis without acute inflammatory process.   No free fluid, fluid collection, hemorrhage, hematoma, abscess or ascites.   Vascular/Lymphatic: Limited without IV contrast. Aorta atherosclerotic. Negative for aneurysm. No retroperitoneal hemorrhage or hematoma.   No bulky adenopathy.   Nonspecific small nonenlarged mesenteric root lymph nodes with slight mesenteric haziness, image 34/2. This can be seen with mesenteric adenitis/panniculitis.   Reproductive: Prostate gland is mildly  enlarged. Seminal vesicles are  symmetric. Prostate calcifications noted.   Other: No abdominal wall hernia or abnormality. No abdominopelvic ascites.   Musculoskeletal: Degenerative changes throughout the spine. Lower lumbar facet arthropathy. L5-S1 advanced degenerative disc disease noted. No acute osseous finding.   IMPRESSION: 5 mm nonobstructing left distal ureteral calculus close to the left UVJ.   Additional nonobstructing punctate intrarenal calculi bilaterally.   Right liver indeterminate hypodense lesions measuring up to 4.4 cm. Consider initial evaluation with nonemergent follow-up abdominal ultrasound to determine if solid or cystic.   Cholelithiasis   Aortic Atherosclerosis (ICD10-I70.0).     Electronically Signed By: CHRISTELLA.  Shick M.D. On: 03/09/2022 10:37     Assessment & Plan:     1. Kidney stones (Primary) -We discussed the management of kidney stones. These options include observation, ureteroscopy, shockwave lithotripsy (ESWL) and percutaneous nephrolithotomy (PCNL). We discussed which options are relevant to the patient's stone(s). We discussed the natural history of kidney stones as well as the complications of untreated stones and the impact on quality of life without treatment as well as with each of the above listed treatments. We also discussed the efficacy of each treatment in its ability to clear the stone burden. With any of these management options I discussed the signs and symptoms of infection and the need for emergent treatment should these be experienced. For each option we discussed the ability of each procedure to clear the patient of their stone burden.   For observation I described the risks which include but are not limited to silent renal damage, life-threatening infection, need for emergent surgery, failure to pass stone and pain.   For ureteroscopy I described the risks which include bleeding, infection, damage to contiguous structures, positioning injury, ureteral stricture,  ureteral avulsion, ureteral injury, need for prolonged ureteral stent, inability to perform ureteroscopy, need for an interval procedure, inability to clear stone burden, stent discomfort/pain, heart attack, stroke, pulmonary embolus and the inherent risks with general anesthesia.   For shockwave lithotripsy I described the risks which include arrhythmia, kidney contusion, kidney hemorrhage, need for transfusion, pain, inability to adequately break up stone, inability to pass stone fragments, Steinstrasse, infection associated with obstructing stones, need for alternate surgical procedure, need for repeat shockwave lithotripsy, MI, CVA, PE and the inherent risks with anesthesia/conscious sedation.   For PCNL I described the risks including positioning injury, pneumothorax, hydrothorax, need for chest tube, inability to clear stone burden, renal laceration, arterial venous fistula or malformation, need for embolization of kidney, loss of kidney or renal function, need for repeat procedure, need for prolonged nephrostomy tube, ureteral avulsion, MI, CVA, PE and the inherent risks of general anesthesia.   - The patient would like to proceed with left ESWL

## 2024-09-10 NOTE — Progress Notes (Signed)
 Reddened skin over treatment area on left side.  No open areas noted.

## 2024-09-11 ENCOUNTER — Encounter (HOSPITAL_COMMUNITY): Payer: Self-pay | Admitting: Urology

## 2024-09-25 ENCOUNTER — Ambulatory Visit: Admitting: Urology

## 2024-09-25 ENCOUNTER — Ambulatory Visit (HOSPITAL_COMMUNITY)
Admission: RE | Admit: 2024-09-25 | Discharge: 2024-09-25 | Disposition: A | Discharge status: Home / Self Care | Source: Ambulatory Visit | Attending: Urology | Admitting: Urology

## 2024-09-25 VITALS — BP 165/80 | HR 52

## 2024-09-25 DIAGNOSIS — N200 Calculus of kidney: Secondary | ICD-10-CM | POA: Diagnosis present

## 2024-09-25 LAB — URINALYSIS, ROUTINE W REFLEX MICROSCOPIC
Bilirubin, UA: NEGATIVE
Glucose, UA: NEGATIVE
Ketones, UA: NEGATIVE
Leukocytes,UA: NEGATIVE
Nitrite, UA: NEGATIVE
Protein,UA: NEGATIVE
Specific Gravity, UA: 1.02 (ref 1.005–1.030)
Urobilinogen, Ur: 0.2 mg/dL (ref 0.2–1.0)
pH, UA: 6 (ref 5.0–7.5)

## 2024-09-25 LAB — MICROSCOPIC EXAMINATION
Bacteria, UA: NONE SEEN
RBC, Urine: >30 /HPF — AB (ref 0–2)

## 2024-09-25 MED ORDER — OXYCODONE-ACETAMINOPHEN 5-325 MG PO TABS: 1.0000 | ORAL_TABLET | ORAL | 0 refills | Status: DC | PRN

## 2024-09-25 MED ORDER — ONDANSETRON HCL 4 MG PO TABS: 4.0000 mg | ORAL_TABLET | Freq: Every day | ORAL | 1 refills | Status: AC | PRN

## 2024-09-25 MED ORDER — TAMSULOSIN HCL 0.4 MG PO CAPS: 0.4000 mg | ORAL_CAPSULE | Freq: Every day | ORAL | 11 refills | Status: AC

## 2024-09-25 NOTE — Progress Notes (Unsigned)
 09/25/2024 11:58 AM   Kyle Mccarthy 31-May-1956 [id number]  Referring provider: Dow Longs, PA-C [address] [place],  [place] [address]  No chief complaint on file.   HPI:    PMH: Past Medical History:  Diagnosis Date   Anxiety    Depression    Hx of blood clots    left leg years ago   Hypertension    Sleep apnea     Surgical History: Past Surgical History:  Procedure Laterality Date   EXTRACORPOREAL SHOCK WAVE LITHOTRIPSY Left 03/15/2022   Procedure: EXTRACORPOREAL SHOCK WAVE LITHOTRIPSY (ESWL);  Surgeon: Roseann Adine PARAS., MD;  Location: [facility];  Service: Urology;  Laterality: Left;   EXTRACORPOREAL SHOCK WAVE LITHOTRIPSY Right 04/05/2022   Procedure: EXTRACORPOREAL SHOCK WAVE LITHOTRIPSY (ESWL);  Surgeon: Sherrilee Belvie CROME, MD;  Location: [facility];  Service: Urology;  Laterality: Right;   EXTRACORPOREAL SHOCK WAVE LITHOTRIPSY Left 09/10/2024   Procedure: LITHOTRIPSY, ESWL;  Surgeon: Sherrilee Belvie CROME, MD;  Location: [facility];  Service: Urology;  Laterality: Left;   EYE SURGERY     cateract surgery 20 yrs ago   SKIN CANCER EXCISION      Home Medications:  Allergies as of 09/25/2024   No Known Allergies      Medication List        Accurate as of September 25, 2024 11:58 AM. If you have any questions, ask your nurse or doctor.          atorvastatin 80 MG tablet Commonly known as: LIPITOR Take 80 mg by mouth daily.   busPIRone 5 MG tablet Commonly known as: BUSPAR Take 5 mg by mouth 2 (two) times daily.   cyclobenzaprine  5 MG tablet Commonly known as: FLEXERIL  Take 1 tablet (5 mg total) by mouth 2 (two) times daily as needed for muscle spasms.   doxycycline  100 MG capsule Commonly known as: VIBRAMYCIN  Take 1 capsule (100 mg total) by mouth every 12 (twelve) hours.   famotidine 40 MG tablet Commonly known as: PEPCID Take 40 mg by mouth daily.   furosemide 40 MG tablet Commonly known as: LASIX Take 40 mg by mouth daily as needed.    Lacosamide  100 MG Tabs TAKE 1 TABLET BY MOUTH TWICE A DAY   omeprazole 20 MG capsule Commonly known as: PRILOSEC Take 20 mg by mouth daily.   ondansetron  4 MG tablet Commonly known as: Zofran  Take 1 tablet (4 mg total) by mouth daily as needed for nausea or vomiting.   oxyCODONE -acetaminophen  5-325 MG tablet Commonly known as: Percocet Take 1 tablet by mouth every 4 (four) hours as needed.   propranolol ER 80 MG 24 hr capsule Commonly known as: INDERAL LA Take 80 mg by mouth daily.   QUEtiapine 100 MG tablet Commonly known as: SEROQUEL Take 100 mg by mouth at bedtime.   sertraline  100 MG tablet Commonly known as: ZOLOFT  Take 1 tablet (100 mg total) by mouth daily.   tamsulosin  0.4 MG Caps capsule Commonly known as: FLOMAX  Take 1 capsule (0.4 mg total) by mouth daily after supper.   zolpidem  10 MG tablet Commonly known as: AMBIEN  Take 1 tablet (10 mg total) by mouth at bedtime as needed for sleep.        Allergies: No Known Allergies  Family History: Family History  Problem Relation Age of Onset   Alcohol abuse Maternal Uncle    Post-traumatic stress disorder Son     Social History:  reports that he has never smoked. He has  never used smokeless tobacco. He reports current alcohol use of about 2.0 standard drinks of alcohol per week. He reports that he does not use drugs.  ROS: All other review of systems were reviewed and are negative except what is noted above in HPI  Physical Exam: BP (!) 165/80   Pulse (!) 52   Constitutional:  Alert and oriented, No acute distress. HEENT: [place] AT, moist mucus membranes.  Trachea midline, no masses. Cardiovascular: No clubbing, cyanosis, or edema. Respiratory: Normal respiratory effort, no increased work of breathing. GI: Abdomen is soft, nontender, nondistended, no abdominal masses GU: No CVA tenderness.  Lymph: No cervical or inguinal lymphadenopathy. Skin: No rashes, bruises or suspicious lesions. Neurologic:  Grossly intact, no focal deficits, moving all 4 extremities. Psychiatric: Normal mood and affect.  Laboratory Data: Lab Results  Component Value Date   WBC 5.4 06/06/2023   HGB 12.7 (L) 06/06/2023   HCT 37.8 (L) 06/06/2023   MCV 95.5 06/06/2023   PLT 128 (L) 06/06/2023    Lab Results  Component Value Date   CREATININE 1.12 06/06/2023    No results found for: PSA  No results found for: TESTOSTERONE  Lab Results  Component Value Date   HGBA1C 5.2 06/05/2023    Urinalysis    Component Value Date/Time   COLORURINE YELLOW 06/04/2023 1838   APPEARANCEUR Clear 07/01/2024 1527   LABSPEC 1.017 06/04/2023 1838   PHURINE 5.0 06/04/2023 1838   GLUCOSEU Negative 07/01/2024 1527   HGBUR NEGATIVE 06/04/2023 1838   BILIRUBINUR Negative 07/01/2024 1527   KETONESUR NEGATIVE 06/04/2023 1838   PROTEINUR Negative 07/01/2024 1527   PROTEINUR NEGATIVE 06/04/2023 1838   NITRITE Negative 07/01/2024 1527   NITRITE NEGATIVE 06/04/2023 1838   LEUKOCYTESUR Negative 07/01/2024 1527   LEUKOCYTESUR NEGATIVE 06/04/2023 1838    Lab Results  Component Value Date   LABMICR Comment 07/01/2024   WBCUA 0-5 06/01/2022   LABEPIT None seen 06/01/2022   MUCUS Many (A) 04/20/2022   BACTERIA None seen 06/01/2022    Pertinent Imaging: *** Results for orders placed during the hospital encounter of 09/25/24  DG Abd 1 View  Narrative CLINICAL DATA:  ureteral stone  EXAM: ABDOMEN - 1 VIEW  COMPARISON:  09/10/2024, 03/09/2022  FINDINGS: Nonobstructive bowel gas pattern.No pneumoperitoneum. Punctate calculus overlying the left renal fossa. In the region of the right renal pelvis, there is a small density just to the right of the L2 transverse process, measuring 3 mm, possibly a ureteral calculus. The distal left ureteral calculus on the prior CT is no longer present. Bilateral vascular calcification in the pelvis. In the midline pelvis, there is a 4 mm density. No acute fracture  or destructive lesion. Multilevel thoracic osteophytosis.  IMPRESSION: 1. Likely punctate calculus overlying the inferior left renal fossa. In the region of the right renal pelvis, just to the right of the L2 transverse process, there is a 3 mm density, possibly a small renal pelvis or proximal right ureteral calculus. 2. The distal left ureteral calculus on the prior CT is no longer present. In the midline pelvis, a 4 mm density is present, possibly a bladder calculus or prostate parenchymal calcification.   Electronically Signed By: Rogelia Myers M.D. On: 09/25/2024 10:56  No results found for this or any previous visit.  No results found for this or any previous visit.  No results found for this or any previous visit.  Results for orders placed during the hospital encounter of 04/19/23  Ultrasound renal complete  Narrative  CLINICAL DATA:  Nephrolithiasis  EXAM: RENAL / URINARY TRACT ULTRASOUND COMPLETE  COMPARISON:  Renal ultrasound 10/18/2022  FINDINGS: Right Kidney:  Renal measurements: 11.0 x 5.1 x 6.0 cm = volume: 176.8 mL. The renal cortical thickness and echogenicity. No hydronephrosis. There is a simple appearing 3.5 cm cyst superior pole right kidney. Opening follow-up needed.  Left Kidney:  Renal measurements: 12.5 x 6.3 x 5.6 cm = volume: 229.7 mL. Normal renal cortical thickness and echogenicity. No hydronephrosis. There is a 15 mm probable stone midpole left kidney.  Bladder:  Appears normal for degree of bladder distention.  Other:  None.  IMPRESSION: 1. No hydronephrosis. 2. Left nephrolithiasis.   Electronically Signed By: Bard Moats M.D. On: 04/21/2023 06:42  No results found for this or any previous visit.  No results found for this or any previous visit.  Results for orders placed in visit on 03/09/22  CT RENAL STONE STUDY  Narrative CLINICAL DATA:  Flank pain, history of nephrolithiasis, left lower quadrant pain for  1 week  EXAM: CT ABDOMEN AND PELVIS WITHOUT CONTRAST  TECHNIQUE: Multidetector CT imaging of the abdomen and pelvis was performed following the standard protocol without IV contrast.  RADIATION DOSE REDUCTION: This exam was performed according to the departmental dose-optimization program which includes automated exposure control, adjustment of the mA and/or kV according to patient size and/or use of iterative reconstruction technique.  COMPARISON:  None available  FINDINGS: Lower chest: Scattered areas of bibasilar atelectasis. Subcentimeter calcified granuloma in the left lower lobe. Mild cardiac enlargement. No pericardial or pleural effusion.  Hepatobiliary: Limited without IV contrast. Round 4.4 cm hypodense lesion in the right liver posteriorly has relatively low Hounsfield measurements, image 17/2, but remains indeterminate by noncontrast imaging. This could be further evaluated with abdominal ultrasound to determine if the lesion is solid or cystic. There is an additional ill-defined subcapsular hypodense lesion more inferior in the right liver measuring 2.1 cm, image 27/2.  No biliary dilatation or obstruction pattern.  Cholelithiasis noted. Gallbladder nondistended. Common bile duct nondilated.  Pancreas: Unremarkable. No pancreatic ductal dilatation or surrounding inflammatory changes.  Spleen: Normal in size without focal abnormality.  Adrenals/Urinary Tract: Normal adrenal glands.  Kidneys demonstrate tiny punctate nonobstructing intrarenal calculi bilaterally. No acute hydronephrosis. Ureters are symmetric and decompressed.  However, the left distal ureter does demonstrate a nonobstructing 5 mm calculus, image 73/2 in the pelvis very close to the left UVJ.  Bladder unremarkable.  Stomach/Bowel: Negative for bowel obstruction, significant dilatation, ileus, or free air. Normal appendix demonstrated. Scattered left colon and sigmoid diverticulosis  without acute inflammatory process.  No free fluid, fluid collection, hemorrhage, hematoma, abscess or ascites.  Vascular/Lymphatic: Limited without IV contrast. Aorta atherosclerotic. Negative for aneurysm. No retroperitoneal hemorrhage or hematoma.  No bulky adenopathy.  Nonspecific small nonenlarged mesenteric root lymph nodes with slight mesenteric haziness, image 34/2. This can be seen with mesenteric adenitis/panniculitis.  Reproductive: Prostate gland is mildly enlarged. Seminal vesicles are symmetric. Prostate calcifications noted.  Other: No abdominal wall hernia or abnormality. No abdominopelvic ascites.  Musculoskeletal: Degenerative changes throughout the spine. Lower lumbar facet arthropathy. L5-S1 advanced degenerative disc disease noted. No acute osseous finding.  IMPRESSION: 5 mm nonobstructing left distal ureteral calculus close to the left UVJ.  Additional nonobstructing punctate intrarenal calculi bilaterally.  Right liver indeterminate hypodense lesions measuring up to 4.4 cm. Consider initial evaluation with nonemergent follow-up abdominal ultrasound to determine if solid or cystic.  Cholelithiasis  Aortic Atherosclerosis (ICD10-I70.0).   Electronically Signed  By: EMERSON Specking M.D. On: 03/09/2022 10:37   Assessment & Plan:    1. Kidney stones (Primary) -continue medical expulsive therapy -followup 2 weeks with KUB - Urinalysis, Routine w reflex microscopic   No follow-ups on file.  Belvie Clara, MD  [facility] [place]

## 2024-09-26 ENCOUNTER — Encounter: Payer: Self-pay | Admitting: Urology

## 2024-09-26 NOTE — Patient Instructions (Signed)

## 2024-10-14 ENCOUNTER — Ambulatory Visit (INDEPENDENT_AMBULATORY_CARE_PROVIDER_SITE_OTHER): Admitting: Urology

## 2024-10-14 ENCOUNTER — Ambulatory Visit (HOSPITAL_COMMUNITY)
Admission: RE | Admit: 2024-10-14 | Discharge: 2024-10-14 | Disposition: A | Discharge status: Home / Self Care | Source: Ambulatory Visit | Attending: Urology | Admitting: Urology

## 2024-10-14 VITALS — BP 167/79 | HR 54

## 2024-10-14 DIAGNOSIS — N200 Calculus of kidney: Secondary | ICD-10-CM | POA: Diagnosis present

## 2024-10-14 DIAGNOSIS — N401 Enlarged prostate with lower urinary tract symptoms: Secondary | ICD-10-CM

## 2024-10-14 DIAGNOSIS — R351 Nocturia: Secondary | ICD-10-CM

## 2024-10-14 LAB — URINALYSIS, ROUTINE W REFLEX MICROSCOPIC
Bilirubin, UA: NEGATIVE
Glucose, UA: NEGATIVE
Ketones, UA: NEGATIVE
Leukocytes,UA: NEGATIVE
Nitrite, UA: NEGATIVE
Protein,UA: NEGATIVE
RBC, UA: NEGATIVE
Specific Gravity, UA: 1.01 (ref 1.005–1.030)
Urobilinogen, Ur: 0.2 mg/dL (ref 0.2–1.0)
pH, UA: 7 (ref 5.0–7.5)

## 2024-10-14 NOTE — Progress Notes (Unsigned)
 10/14/2024 11:33 AM   Lynwood Ozell [place]onshire October 22, 1956 [id number]  Referring provider: Dow Longs, PA-C [address] [place],  [place] [address]  Followup nephrolithiasis   HPI: Mr Tomeo is a 68yo here for followup for nephrolithiasis. He passed his calculus since last visit. KUB shows no calculus. No new LUTS   PMH: Past Medical History:  Diagnosis Date   Anxiety    Depression    Hx of blood clots    left leg years ago   Hypertension    Sleep apnea     Surgical History: Past Surgical History:  Procedure Laterality Date   EXTRACORPOREAL SHOCK WAVE LITHOTRIPSY Left 03/15/2022   Procedure: EXTRACORPOREAL SHOCK WAVE LITHOTRIPSY (ESWL);  Surgeon: Roseann Adine PARAS., MD;  Location: [facility];  Service: Urology;  Laterality: Left;   EXTRACORPOREAL SHOCK WAVE LITHOTRIPSY Right 04/05/2022   Procedure: EXTRACORPOREAL SHOCK WAVE LITHOTRIPSY (ESWL);  Surgeon: Sherrilee Belvie CROME, MD;  Location: [facility];  Service: Urology;  Laterality: Right;   EXTRACORPOREAL SHOCK WAVE LITHOTRIPSY Left 09/10/2024   Procedure: LITHOTRIPSY, ESWL;  Surgeon: Sherrilee Belvie CROME, MD;  Location: [facility];  Service: Urology;  Laterality: Left;   EYE SURGERY     cateract surgery 20 yrs ago   SKIN CANCER EXCISION      Home Medications:  Allergies as of 10/14/2024   No Known Allergies      Medication List        Accurate as of October 14, 2024 11:33 AM. If you have any questions, ask your nurse or doctor.          atorvastatin 80 MG tablet Commonly known as: LIPITOR Take 80 mg by mouth daily.   busPIRone 5 MG tablet Commonly known as: BUSPAR Take 5 mg by mouth 2 (two) times daily.   cyclobenzaprine  5 MG tablet Commonly known as: FLEXERIL  Take 1 tablet (5 mg total) by mouth 2 (two) times daily as needed for muscle spasms.   doxycycline  100 MG capsule Commonly known as: VIBRAMYCIN  Take 1 capsule (100 mg total) by mouth every 12 (twelve) hours.   famotidine 40 MG tablet Commonly known as:  PEPCID Take 40 mg by mouth daily.   furosemide 40 MG tablet Commonly known as: LASIX Take 40 mg by mouth daily as needed.   Lacosamide  100 MG Tabs TAKE 1 TABLET BY MOUTH TWICE A DAY   omeprazole 20 MG capsule Commonly known as: PRILOSEC Take 20 mg by mouth daily.   ondansetron  4 MG tablet Commonly known as: Zofran  Take 1 tablet (4 mg total) by mouth daily as needed for nausea or vomiting.   oxyCODONE -acetaminophen  5-325 MG tablet Commonly known as: Percocet Take 1 tablet by mouth every 4 (four) hours as needed.   propranolol ER 80 MG 24 hr capsule Commonly known as: INDERAL LA Take 80 mg by mouth daily.   QUEtiapine 100 MG tablet Commonly known as: SEROQUEL Take 100 mg by mouth at bedtime.   sertraline  100 MG tablet Commonly known as: ZOLOFT  Take 1 tablet (100 mg total) by mouth daily.   tamsulosin  0.4 MG Caps capsule Commonly known as: FLOMAX  Take 1 capsule (0.4 mg total) by mouth daily after supper.   zolpidem  10 MG tablet Commonly known as: AMBIEN  Take 1 tablet (10 mg total) by mouth at bedtime as needed for sleep.        Allergies: No Known Allergies  Family History: Family History  Problem Relation Age of Onset   Alcohol abuse Maternal Uncle  Post-traumatic stress disorder Son     Social History:  reports that he has never smoked. He has never used smokeless tobacco. He reports current alcohol use of about 2.0 standard drinks of alcohol per week. He reports that he does not use drugs.  ROS: All other review of systems were reviewed and are negative except what is noted above in HPI  Physical Exam: BP (!) 167/79   Pulse (!) 54   Constitutional:  Alert and oriented, No acute distress. HEENT: [place] AT, moist mucus membranes.  Trachea midline, no masses. Cardiovascular: No clubbing, cyanosis, or edema. Respiratory: Normal respiratory effort, no increased work of breathing. GI: Abdomen is soft, nontender, nondistended, no abdominal masses GU: No CVA  tenderness.  Lymph: No cervical or inguinal lymphadenopathy. Skin: No rashes, bruises or suspicious lesions. Neurologic: Grossly intact, no focal deficits, moving all 4 extremities. Psychiatric: Normal mood and affect.  Laboratory Data: Lab Results  Component Value Date   WBC 5.4 06/06/2023   HGB 12.7 (L) 06/06/2023   HCT 37.8 (L) 06/06/2023   MCV 95.5 06/06/2023   PLT 128 (L) 06/06/2023    Lab Results  Component Value Date   CREATININE 1.12 06/06/2023    No results found for: PSA  No results found for: TESTOSTERONE  Lab Results  Component Value Date   HGBA1C 5.2 06/05/2023    Urinalysis    Component Value Date/Time   COLORURINE YELLOW 06/04/2023 1838   APPEARANCEUR Clear 09/25/2024 1113   LABSPEC 1.017 06/04/2023 1838   PHURINE 5.0 06/04/2023 1838   GLUCOSEU Negative 09/25/2024 1113   HGBUR NEGATIVE 06/04/2023 1838   BILIRUBINUR Negative 09/25/2024 1113   KETONESUR NEGATIVE 06/04/2023 1838   PROTEINUR Negative 09/25/2024 1113   PROTEINUR NEGATIVE 06/04/2023 1838   NITRITE Negative 09/25/2024 1113   NITRITE NEGATIVE 06/04/2023 1838   LEUKOCYTESUR Negative 09/25/2024 1113   LEUKOCYTESUR NEGATIVE 06/04/2023 1838    Lab Results  Component Value Date   LABMICR See below: 09/25/2024   WBCUA 0-5 09/25/2024   LABEPIT 0-10 09/25/2024   MUCUS Present (A) 09/25/2024   BACTERIA None seen 09/25/2024    Pertinent Imaging: KUB today: Images reviewed and discussed with the patient  Results for orders placed during the hospital encounter of 09/25/24  DG Abd 1 View  Narrative CLINICAL DATA:  ureteral stone  EXAM: ABDOMEN - 1 VIEW  COMPARISON:  09/10/2024, 03/09/2022  FINDINGS: Nonobstructive bowel gas pattern.No pneumoperitoneum. Punctate calculus overlying the left renal fossa. In the region of the right renal pelvis, there is a small density just to the right of the L2 transverse process, measuring 3 mm, possibly a ureteral calculus. The distal left  ureteral calculus on the prior CT is no longer present. Bilateral vascular calcification in the pelvis. In the midline pelvis, there is a 4 mm density. No acute fracture or destructive lesion. Multilevel thoracic osteophytosis.  IMPRESSION: 1. Likely punctate calculus overlying the inferior left renal fossa. In the region of the right renal pelvis, just to the right of the L2 transverse process, there is a 3 mm density, possibly a small renal pelvis or proximal right ureteral calculus. 2. The distal left ureteral calculus on the prior CT is no longer present. In the midline pelvis, a 4 mm density is present, possibly a bladder calculus or prostate parenchymal calcification.   Electronically Signed By: Rogelia Myers M.D. On: 09/25/2024 10:56  No results found for this or any previous visit.  No results found for this or any previous  visit.  No results found for this or any previous visit.  Results for orders placed during the hospital encounter of 04/19/23  Ultrasound renal complete  Narrative CLINICAL DATA:  Nephrolithiasis  EXAM: RENAL / URINARY TRACT ULTRASOUND COMPLETE  COMPARISON:  Renal ultrasound 10/18/2022  FINDINGS: Right Kidney:  Renal measurements: 11.0 x 5.1 x 6.0 cm = volume: 176.8 mL. The renal cortical thickness and echogenicity. No hydronephrosis. There is a simple appearing 3.5 cm cyst superior pole right kidney. Opening follow-up needed.  Left Kidney:  Renal measurements: 12.5 x 6.3 x 5.6 cm = volume: 229.7 mL. Normal renal cortical thickness and echogenicity. No hydronephrosis. There is a 15 mm probable stone midpole left kidney.  Bladder:  Appears normal for degree of bladder distention.  Other:  None.  IMPRESSION: 1. No hydronephrosis. 2. Left nephrolithiasis.   Electronically Signed By: Bard Moats M.D. On: 04/21/2023 06:42  No results found for this or any previous visit.  No results found for this or any previous  visit.  Results for orders placed in visit on 03/09/22  CT RENAL STONE STUDY  Narrative CLINICAL DATA:  Flank pain, history of nephrolithiasis, left lower quadrant pain for 1 week  EXAM: CT ABDOMEN AND PELVIS WITHOUT CONTRAST  TECHNIQUE: Multidetector CT imaging of the abdomen and pelvis was performed following the standard protocol without IV contrast.  RADIATION DOSE REDUCTION: This exam was performed according to the departmental dose-optimization program which includes automated exposure control, adjustment of the mA and/or kV according to patient size and/or use of iterative reconstruction technique.  COMPARISON:  None available  FINDINGS: Lower chest: Scattered areas of bibasilar atelectasis. Subcentimeter calcified granuloma in the left lower lobe. Mild cardiac enlargement. No pericardial or pleural effusion.  Hepatobiliary: Limited without IV contrast. Round 4.4 cm hypodense lesion in the right liver posteriorly has relatively low Hounsfield measurements, image 17/2, but remains indeterminate by noncontrast imaging. This could be further evaluated with abdominal ultrasound to determine if the lesion is solid or cystic. There is an additional ill-defined subcapsular hypodense lesion more inferior in the right liver measuring 2.1 cm, image 27/2.  No biliary dilatation or obstruction pattern.  Cholelithiasis noted. Gallbladder nondistended. Common bile duct nondilated.  Pancreas: Unremarkable. No pancreatic ductal dilatation or surrounding inflammatory changes.  Spleen: Normal in size without focal abnormality.  Adrenals/Urinary Tract: Normal adrenal glands.  Kidneys demonstrate tiny punctate nonobstructing intrarenal calculi bilaterally. No acute hydronephrosis. Ureters are symmetric and decompressed.  However, the left distal ureter does demonstrate a nonobstructing 5 mm calculus, image 73/2 in the pelvis very close to the left UVJ.  Bladder  unremarkable.  Stomach/Bowel: Negative for bowel obstruction, significant dilatation, ileus, or free air. Normal appendix demonstrated. Scattered left colon and sigmoid diverticulosis without acute inflammatory process.  No free fluid, fluid collection, hemorrhage, hematoma, abscess or ascites.  Vascular/Lymphatic: Limited without IV contrast. Aorta atherosclerotic. Negative for aneurysm. No retroperitoneal hemorrhage or hematoma.  No bulky adenopathy.  Nonspecific small nonenlarged mesenteric root lymph nodes with slight mesenteric haziness, image 34/2. This can be seen with mesenteric adenitis/panniculitis.  Reproductive: Prostate gland is mildly enlarged. Seminal vesicles are symmetric. Prostate calcifications noted.  Other: No abdominal wall hernia or abnormality. No abdominopelvic ascites.  Musculoskeletal: Degenerative changes throughout the spine. Lower lumbar facet arthropathy. L5-S1 advanced degenerative disc disease noted. No acute osseous finding.  IMPRESSION: 5 mm nonobstructing left distal ureteral calculus close to the left UVJ.  Additional nonobstructing punctate intrarenal calculi bilaterally.  Right liver indeterminate hypodense lesions measuring  up to 4.4 cm. Consider initial evaluation with nonemergent follow-up abdominal ultrasound to determine if solid or cystic.  Cholelithiasis  Aortic Atherosclerosis (ICD10-I70.0).   Electronically Signed By: CHRISTELLA.  Shick M.D. On: 03/09/2022 10:37   Assessment & Plan:    1. Kidney stones (Primary) Followup 3 months with renal US  - Urinalysis, Routine w reflex microscopic  2. Nocturia Flomax  0.4mg  daily  3. Benign prostatic hyperplasia with urinary obstruction Flomax  0.4mg  daily   No follow-ups on file.  Belvie Clara, MD  [facility] [place]

## 2024-10-15 ENCOUNTER — Encounter: Payer: Self-pay | Admitting: Urology

## 2024-10-15 NOTE — Patient Instructions (Signed)

## 2024-10-21 ENCOUNTER — Encounter (HOSPITAL_COMMUNITY): Payer: Self-pay | Admitting: Urology

## 2024-12-04 ENCOUNTER — Other Ambulatory Visit: Payer: Self-pay | Admitting: Neurology

## 2024-12-05 NOTE — Telephone Encounter (Signed)
 Medication: Lacosimide Directions: TAKE 1 TABLET BY MOUTH TWICE A DAY,  Last given: 08/21/2024 Number refills:  Last o/v: 0 Follow up:  Labs:   Please review refill request.

## 2024-12-06 ENCOUNTER — Ambulatory Visit (INDEPENDENT_AMBULATORY_CARE_PROVIDER_SITE_OTHER): Payer: Medicare Other | Admitting: Otolaryngology

## 2024-12-23 ENCOUNTER — Telehealth: Payer: Self-pay

## 2024-12-23 NOTE — Telephone Encounter (Signed)
 Copied from CRM [id number]. Topic: Appointments - Appointment Cancel/Reschedule >> Dec 23, 2024  8:13 AM [facility] wrote: Patient/patient representative is calling to cancel or reschedule an appointment. Refer to attachments for appointment information. >> Dec 23, 2024  8:20 AM [facility] wrote: Patient rescheduled his appointment and is asking whether he needs to bring his CPAP machine to the visit. Agent advised that there were no indications noted in appt instructions, Patient requested that a message be sent to the clinical team for confirmation and a callback. Patient callback number   [phone]       Called and spoke with the pt and advised pt to bring SD card to office visit.  Nothing further needed.

## 2024-12-27 ENCOUNTER — Telehealth: Payer: Self-pay

## 2024-12-27 ENCOUNTER — Ambulatory Visit: Payer: Medicare Other | Admitting: Primary Care

## 2024-12-27 NOTE — Telephone Encounter (Signed)
 Pt is scheduled to see Almarie Ferrari, NP on Monday, 12-30-2024. I informed pt that we would need him to bring his machine to [facility] in [place] so they can print off a compliance report. Pt stated he will do this and he will also bring his SD card. NFN

## 2024-12-30 ENCOUNTER — Ambulatory Visit: Admitting: Primary Care

## 2024-12-30 ENCOUNTER — Encounter: Payer: Self-pay | Admitting: Primary Care

## 2024-12-30 VITALS — BP 130/66 | HR 71 | Ht 70.0 in | Wt 259.6 lb

## 2024-12-30 DIAGNOSIS — G4733 Obstructive sleep apnea (adult) (pediatric): Secondary | ICD-10-CM | POA: Diagnosis not present

## 2024-12-30 NOTE — Progress Notes (Signed)
 "  @Patient  ID: Kyle Mccarthy, male    DOB: 01/27/1956, 69 y.o.   MRN: [id number]  Chief Complaint  Patient presents with   Sleep Apnea    Doing well on CPAP.    Referring provider: Dow Longs, PA-C  HPI: 69 year old male, never smoked. PMH significant for hypertension, obstructive sleep apnea, insomnia, cognitive impairment, depression, obesity, DVT on Xarelto.  Patient had a sleep study on 02/04/2021 that showed severe obstructive sleep apnea, AHI 60.5 an hour.  Patient spent 219 minutes with an oxygen level less than 90%.  He was started on auto CPAP.  Previous [facility] pulmonary encounter:  05/10/2023 Patient presents today for sleep consult. He has been seeing neurology for memory loss, last seen in April. He has been having daily headaches. Hx sleep apnea, he had a home sleep study with his primary care in 2023. Currently on CPAP. Feels CPAP is not working properly, settings have not been adjusted. He continues to have fatigue symptoms. He lives alone and is unsure if he snores. He takes ambien  nightly for insomnia. He gets on average 6.5 or 7 hours of sleep a night. Patient used to work as a naval architect, he is now on short term disability. He likes to stay busy. He went from working 60 hours a week to not work. His primary took him out of work for 1 year, may look into retiring after that. DME company is [employer] out of [place] .   Patient had a sleep study on 02/04/2021 that showed severe obstructive sleep apnea, AHI 60.5 an hour.  Patient spent 219 minutes with an oxygen level less than 90%.  He was started on auto CPAP.  Sleep questionnaire Symptoms-  Duration and quality of his sleep fluctuates  Prior sleep study- February 17th 2022, results are not in chart (requesting from [facility] in [place] -Dr. Longs Ivanoff )  Bedtime- 10-11pm Time to fall asleep- 5 mins  Nocturnal awakenings- twice  Out of bed/start of day- 7am  Weight changes- 25 lbs  Do you operate  heavy machinery- yes  Do you currently wear CPAP- yes  Do you current wear oxygen- no Epworth- 17  06/27/2023 Patient presents today for follow-up OSA.  Patient was seen for sleep consult in May due to fatigue symptoms.  He has a history of sleep apnea is currently on CPAP.  Since his last overview we were able to receive compliance report from his DME company. He is 73% complaint with CPAP use between July 2023-July 2024. Current pressure 6cm h20 with residual AHI 1.8/hour. He takes Seroquel and Ambien  10mg  at bedtime. He has no trouble falling or staying asleep. He does not experience any morning grogginess. He is getting between 5-7 hours of sleep a night. He remains very tired during the day, feels a lot of it has to do with his weight. He tells me he has never weighed this much before. He was hospitalizaed for TIA recently, seeing [facility] tomorrow. We discuss medication to help with hypersomnia. Stimulants can increase blood pressure and heart rate and would need to be used with caution. Patient would like to avoid medication use at this time.  Compliance download Resvent 06/26/22-06/25/23 Usage 277/365; 267 (73%) Average usage 5.2 hours Pressure 6cm h20  AHI 1.8  12/28/2023 Discussed the use of AI scribe software for clinical note transcription with the patient, who gave verbal consent to proceed.  History of Present Illness   The patient, with a history of severe  sleep apnea diagnosed two years ago, has been using a CPAP machine consistently every night for approximately nine hours of sleep. He reports a significant benefit from the CPAP machine, with the only noted issue being dry mouth if the machine is not used after waking up to use the bathroom. The patient also reports chronic fatigue, but denies any impairment in daily activities. He is currently on Ambien  10mg  at bedtime, Zoloft  in the morning, and Seroquel at bedtime. The patient reports no residual grogginess or impairment the  following day.  The patient also has a history of a transient ischemic attack (TIA) that occurred in June. He reports memory issues since the event. The patient also mentions a bout of severe indigestion that has been ongoing since the previous night. He is on omeprazole, but does not take it regularly.   Received compliance report from 11/25/2023 - 12/24/2023 Usage days 30/30 days (100%) greater than 4 hours Average usage 8 hours Pressure 4-20 centimeters H2O (6.7 cm H2O-95%) Air leaks 9.1 L/min (95%) AHI 2.0    12/30/2024- Interim hx  Discussed the use of AI scribe software for clinical note transcription with the patient, who gave verbal consent to proceed.  History of Present Illness Kyle Mccarthy is a 69 year old male with severe sleep apnea who presents for a one-year follow-up.  He has a history of severe sleep apnea, initially diagnosed in February 2022 with a sleep study showing 60 apneic events per hour. Since starting CPAP therapy, his apneic events have significantly reduced to 2.2 per hour. He reports that he sleeps better when using the CPAP and notices that he wakes up more frequently to urinate at night when not using the CPAP. He finds the CPAP beneficial and uses it consistently.  He has been using the same CPAP machine since 2022 and is due for a replacement in 2027. He receives regular supplies for the machine, including masks, tubing, and filters, and uses distilled water for humidification, although he has occasionally used tap water when distilled water was unavailable.  He has a history of a transient ischemic attack (TIA) and was previously on Xarelto for a deep vein thrombosis (DVT) in his leg, but he has completed treatment and is no longer on the medication. He is currently taking Seroquel 100 mg for sleep but has stopped taking Ambien  10 mg due to grogginess the following day. He typically sleeps from 10:30 PM to 6:30 or 7:00 AM, achieving about 7-8 hours of sleep  per night.  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 He has a routine physical scheduled for January 13, 2025, and will have lab work done prior to this appointment.     Allergies[1]  Immunization History  Administered Date(s) Administered   Fluad Quad(high Dose 65+) 10/20/2023   PFIZER(Purple Top)SARS-COV-2 Vaccination 02/22/2020, 03/14/2020, 10/12/2020, 10/20/2023   Pfizer(Comirnaty)Fall Seasonal Vaccine 12 years and older 08/25/2023   Pneumococcal Polysaccharide-23 08/22/2019    Past Medical History:  Diagnosis Date   Anxiety    Depression    Hx of blood clots    left leg years ago   Hypertension    Sleep apnea     Tobacco History: Tobacco Use History[2] Counseling given: Not Answered   Outpatient Medications Prior to Visit  Medication Sig Dispense Refill   amoxicillin (AMOXIL) 875 MG tablet Take 875 mg by mouth 2 (two) times daily.     atorvastatin (  LIPITOR) 80 MG tablet Take 80 mg by mouth daily.     busPIRone (BUSPAR) 5 MG tablet Take 5 mg by mouth 2 (two) times daily.     cyclobenzaprine  (FLEXERIL ) 5 MG tablet Take 1 tablet (5 mg total) by mouth 2 (two) times daily as needed for muscle spasms. 30 tablet 1   famotidine (PEPCID) 40 MG tablet Take 40 mg by mouth daily.     furosemide (LASIX) 40 MG tablet Take 40 mg by mouth daily as needed.     Lacosamide  100 MG TABS TAKE 1 TABLET BY MOUTH TWICE A DAY 180 tablet 2   omeprazole (PRILOSEC) 20 MG capsule Take 20 mg by mouth daily.     ondansetron  (ZOFRAN ) 4 MG tablet Take 1 tablet (4 mg total) by mouth daily as needed for nausea or vomiting. 30 tablet 1   oxyCODONE -acetaminophen  (PERCOCET) 5-325 MG tablet Take 1 tablet by mouth every 4 (four) hours as needed. 30 tablet 0   propranolol ER (INDERAL LA) 80 MG 24 hr capsule Take 80 mg by mouth daily.     QUEtiapine (SEROQUEL) 100 MG tablet Take 100 mg by mouth at bedtime.     sertraline  (ZOLOFT ) 100  MG tablet Take 1 tablet (100 mg total) by mouth daily. 90 tablet 1   tamsulosin  (FLOMAX ) 0.4 MG CAPS capsule Take 1 capsule (0.4 mg total) by mouth daily after supper. 30 capsule 11   zolpidem  (AMBIEN ) 10 MG tablet Take 1 tablet (10 mg total) by mouth at bedtime as needed for sleep. 30 tablet 0   doxycycline  (VIBRAMYCIN ) 100 MG capsule Take 1 capsule (100 mg total) by mouth every 12 (twelve) hours. (Patient not taking: Reported on 12/30/2024) 56 capsule 0   No facility-administered medications prior to visit.   Review of Systems  Review of Systems  Constitutional: Negative.   Respiratory: Negative.    Psychiatric/Behavioral: Negative.     Physical Exam  BP 130/66 (BP Location: Right Arm, Patient Position: Sitting)   Pulse 71   Ht 5' 10 (1.778 m)   Wt 259 lb 9.6 oz (117.8 kg)   SpO2 93% Comment: RA  BMI 37.25 kg/m  Physical Exam Constitutional:      Appearance: Normal appearance. He is well-developed.  HENT:     Head: Normocephalic and atraumatic.     Mouth/Throat:     Mouth: Mucous membranes are moist.     Pharynx: Oropharynx is clear.  Cardiovascular:     Rate and Rhythm: Normal rate and regular rhythm.     Heart sounds: Normal heart sounds.  Pulmonary:     Effort: Pulmonary effort is normal. No respiratory distress.     Breath sounds: Normal breath sounds. No wheezing or rhonchi.  Musculoskeletal:        General: Normal range of motion.     Cervical back: Normal range of motion and neck supple.  Skin:    General: Skin is warm and dry.     Findings: No erythema or rash.  Neurological:     General: No focal deficit present.     Mental Status: He is alert and oriented to person, place, and time. Mental status is at baseline.  Psychiatric:        Mood and Affect: Mood normal.        Behavior: Behavior normal.        Thought Content: Thought content normal.        Judgment: Judgment normal.      Lab Results:  CBC    Component Value Date/Time   WBC 5.4  06/06/2023 0415   RBC 3.96 (L) 06/06/2023 0415   HGB 12.7 (L) 06/06/2023 0415   HGB 13.0 04/11/2023 1534   HCT 37.8 (L) 06/06/2023 0415   HCT 39.3 04/11/2023 1534   PLT 128 (L) 06/06/2023 0415   MCV 95.5 06/06/2023 0415   MCV 94 04/11/2023 1534   MCH 32.1 06/06/2023 0415   MCHC 33.6 06/06/2023 0415   RDW 12.8 06/06/2023 0415   RDW 12.2 04/11/2023 1534   LYMPHSABS 1.5 06/04/2023 1811   LYMPHSABS 1.6 04/11/2023 1534   MONOABS 0.4 06/04/2023 1811   EOSABS 0.1 06/04/2023 1811   EOSABS 0.1 04/11/2023 1534   BASOSABS 0.0 06/04/2023 1811   BASOSABS 0.0 04/11/2023 1534    BMET    Component Value Date/Time   NA 139 06/06/2023 0415   NA 143 04/11/2023 1534   K 3.5 06/06/2023 0415   CL 106 06/06/2023 0415   CO2 25 06/06/2023 0415   GLUCOSE 95 06/06/2023 0415   BUN 19 06/06/2023 0415   BUN 14 04/11/2023 1534   CREATININE 1.12 06/06/2023 0415   CALCIUM 8.7 (L) 06/06/2023 0415   GFRNONAA >60 06/06/2023 0415   GFRAA >60 08/22/2019 0629    BNP No results found for: BNP  ProBNP No results found for: PROBNP  Imaging: No results found.   Assessment & Plan:   1. Obstructive sleep apnea (Primary)  Assessment and Plan Assessment & Plan Obstructive sleep apnea Severe obstructive sleep apnea with 60 events per hour on the 2022 sleep study, now well-controlled with auto CPAP therapy 4-20cm h20, reducing events to 2.2 per hour. Reports improved sleep quality and reduced nocturia with CPAP use. No issues with the current CPAP machine, which has been in use since 2022. Due for a replacement machine in 2027. Uses distilled water for humidification, with occasional use of tap water when necessary. No current use of Ambien , but it remains on the medication list for occasional use. - Continue CPAP therapy with current settings 4-20cm h20. - Renew CPAP supplies with DME company - Replace CPAP machine in 2027. - Use distilled water for humidification; can turn off humidification if  necessary. - Bring medications to upcoming physical with PCP for reconciliation. - Consider reducing Ambien  dose if used, due to potential cognitive issues in older adults.  Almarie LELON Ferrari, NP 12/30/2024     [1] No Known Allergies [2]  Social History Tobacco Use  Smoking Status Never   Passive exposure: Past  Smokeless Tobacco Never   "

## 2024-12-30 NOTE — Patient Instructions (Signed)
 Great seeing you today Mr Kohen, it is alwaysa  pleasure speaking with you  - Look into local [employer] group - it is a network of hyper-local members who give and receive its, skills and services for free. Focus is on community.  - May be on facebook, or an APP called the book of faces or go a google service for our area to  - Let me know how it goes!!!  VISIT SUMMARY: During your visit, we discussed your history of severe sleep apnea and your current management with CPAP therapy. You reported significant improvement in your sleep quality and a reduction in nighttime urination when using the CPAP machine. We also reviewed your medication use and upcoming routine physical.  YOUR PLAN: -OBSTRUCTIVE SLEEP APNEA: Obstructive sleep apnea is a condition where your breathing repeatedly stops and starts during sleep due to blocked upper airways. Your condition is well-controlled with CPAP therapy, which has significantly reduced your apneic events from 60 to 2.2 per hour. Continue using your CPAP machine with the current settings, and remember to use distilled water for humidification. Your CPAP machine is due for replacement in 2027. If you need to use Ambien  occasionally, consider reducing the dose due to potential cognitive issues.  INSTRUCTIONS: Please bring all your medications to your upcoming physical on January 13, 2025, for reconciliation. Ensure you complete your lab work prior to this appointment.  Follow-up 1 year with [facility] NP or sooner

## 2025-01-06 ENCOUNTER — Ambulatory Visit (HOSPITAL_COMMUNITY)
Admission: RE | Admit: 2025-01-06 | Discharge: 2025-01-06 | Disposition: A | Discharge status: Home / Self Care | Source: Ambulatory Visit | Attending: Urology | Admitting: Urology

## 2025-01-06 DIAGNOSIS — N200 Calculus of kidney: Secondary | ICD-10-CM | POA: Insufficient documentation

## 2025-01-13 ENCOUNTER — Ambulatory Visit: Admitting: Urology

## 2025-01-14 ENCOUNTER — Ambulatory Visit: Admitting: Adult Health

## 2025-01-21 ENCOUNTER — Ambulatory Visit: Admitting: Primary Care

## 2025-01-22 ENCOUNTER — Telehealth: Payer: Self-pay | Admitting: Adult Health

## 2025-01-22 ENCOUNTER — Encounter: Payer: Self-pay | Admitting: Adult Health

## 2025-01-22 ENCOUNTER — Ambulatory Visit: Admitting: Adult Health

## 2025-01-22 VITALS — BP 149/82 | HR 62 | Ht 70.0 in | Wt 263.0 lb

## 2025-01-22 DIAGNOSIS — R4789 Other speech disturbances: Secondary | ICD-10-CM

## 2025-01-22 DIAGNOSIS — R269 Unspecified abnormalities of gait and mobility: Secondary | ICD-10-CM

## 2025-01-22 DIAGNOSIS — G4752 REM sleep behavior disorder: Secondary | ICD-10-CM

## 2025-01-22 DIAGNOSIS — G3184 Mild cognitive impairment, so stated: Secondary | ICD-10-CM | POA: Diagnosis not present

## 2025-01-22 DIAGNOSIS — R519 Headache, unspecified: Secondary | ICD-10-CM | POA: Diagnosis not present

## 2025-01-22 DIAGNOSIS — R4189 Other symptoms and signs involving cognitive functions and awareness: Secondary | ICD-10-CM

## 2025-01-22 NOTE — Progress Notes (Signed)
 Chart reviewed, agree above plan ?

## 2025-01-22 NOTE — Telephone Encounter (Signed)
 no auth required sent to [facility] [phone]

## 2025-01-22 NOTE — Patient Instructions (Addendum)
 Your Plan:  We will check lab work today to look for possible Alzheimer's disease. If this is positive, it will be recommended to proceed with amyloid PET scan for further clarification.  If this is negative, we can consider pursuing metabolic PET scan to evaluate for possible Lewy body dementia  You will be called to schedule a repeat MR brain due to new onset headache and changes in your speech  Please follow up with your audiologist regarding worsening tinnitus  Continue Vimpat  at current dose for seizure prevention.  We will check lab work today including basic labs and Vimpat  level    Follow-up will be determined after completion of above workup     Thank you for coming to see us  at [facility]. I hope we have been able to provide you high quality care today.  You may receive a patient satisfaction survey over the next few weeks. We would appreciate your feedback and comments so that we may continue to improve ourselves and the health of our patients.     Long-Term Problems With Thinking and Memory (Dementia): What to Know Dementia is a condition that affects the way the brain works. It often affects thinking and memory.  There are many types of dementia, including: Alzheimer's disease. This is the most common type. Vascular dementia. This type may happen due to a stroke. Lewy body dementia. This type may happen to people who have Parkinson's disease. Frontotemporal dementia. This type is caused by damage to nerve cells in certain parts of the brain. Some people may have more than one type. What are the causes? Dementia is caused by damage to cells in the brain. Some causes that can't be reversed include: Having a condition that affects the blood vessels of the brain. This may be diabetes or heart disease. Changes to genes. Some causes that can be reversed or slowed down include: Injury to the brain due to: A growth called a tumor. A blood clot. Too  much fluid in the brain. Taking certain medicines. An infection. Problems with your thyroid. Not having enough vitamin B12 in the body. Having a disease that causes your body's defense system, called the immune system, to attack healthy parts of your body. What are the signs or symptoms? Symptoms of dementia start slowly and get worse with time. They may include: Problems remembering events or people. Getting lost easily. Forgetting appointments or to pay bills. Having trouble taking a bath or putting clothes on. Having trouble planning and making meals. Having trouble speaking. Changes in behavior or mood. How is this diagnosed? Dementia may be diagnosed based on: Your symptoms and medical history. A physical exam. Tests. These may include: Tests to check your thinking and memory to see how your brain is working. Lab tests. You may have tests on your blood or pee (urine). Imaging tests, such as a CT scan, a PET scan, or an MRI. Genetic testing. This may be done if other family members have had dementia. Your health care provider will talk with you and your family, friends, or caregivers about your history and symptoms. How is this treated? Treatment depends on the cause of the dementia and should start as soon as possible. It might include: Taking medicines for symptoms. Taking medicines to help control or slow down the dementia. Treating the cause of your dementia. Your provider can help you find support groups and other members of the health care team who can help with your care. Follow these instructions at  home: Medicines Take medicines only as told by your provider. Use a pill organizer or pill reminder to help you keep track of your medicines. Avoid taking medicines for pain or for sleep. These can affect your thinking. Lifestyle Make healthy choices. Be active as told by your provider. Do not smoke, vape, or use products with nicotine or tobacco in them. If you need  help quitting, talk with your provider. Do not drink alcohol. When you feel a lot of stress, do something that helps you relax. Your provider can give you tips. Spend time with other people. Make sure you get good sleep at night. These tips can help: Try not to take naps during the day. Keep your bedroom dark and cool. Do not exercise in the few hours before you go to bed. Do not have foods or drinks with caffeine at night. Eating and drinking Drink enough fluid to keep your pee pale yellow. Eat a healthy diet. General instructions  Talk with your provider to decide on: What things you need help with. What your safety needs are. Ask your provider if it's safe for you to drive. If told, wear a bracelet that tracks where you are or shows that you're a person with memory loss. Work with your family to make big legal or health decisions. This may include things like advance directives, medical power of attorney, or a living will. Where to find more information Alzheimer's Association: westerntunes.it [employer] on Aging: baseringtones.pl World Health Organization: visitdestination.com.br Contact a health care provider if: You have any new symptoms. Your symptoms get worse. You have problems with swallowing. Get help right away if: You feel very sad or feel like you may hurt yourself or others. You have thoughts about taking your own life. Your family members are worried about your safety. These symptoms may be an emergency. Take one of these steps right away: Go to your nearest emergency room. Call 911. Call the National Suicide Prevention Lifeline at (574)284-3480 or 988. Text the Crisis Text Line at 8627164833. This information is not intended to replace advice given to you by your health care provider. Make sure you discuss any questions you have with your health care provider. Document Revised: 10/11/2024 Document Reviewed: 02/20/2023 Elsevier Patient Education  2025 [employer].

## 2025-01-23 ENCOUNTER — Ambulatory Visit: Payer: Self-pay | Admitting: Adult Health

## 2025-01-24 LAB — COMPREHENSIVE METABOLIC PANEL WITH GFR
ALT: 13 [IU]/L (ref 0–44)
AST: 15 [IU]/L (ref 0–40)
Albumin: 4.3 g/dL (ref 3.9–4.9)
Alkaline Phosphatase: 109 [IU]/L (ref 47–123)
BUN/Creatinine Ratio: 13 (ref 10–24)
BUN: 17 mg/dL (ref 8–27)
Bilirubin Total: 0.6 mg/dL (ref 0.0–1.2)
CO2: 24 mmol/L (ref 20–29)
Calcium: 9.9 mg/dL (ref 8.6–10.2)
Chloride: 107 mmol/L — ABNORMAL HIGH (ref 96–106)
Creatinine, Ser: 1.3 mg/dL — ABNORMAL HIGH (ref 0.76–1.27)
Globulin, Total: 2.6 g/dL (ref 1.5–4.5)
Glucose: 90 mg/dL (ref 70–99)
Potassium: 4.3 mmol/L (ref 3.5–5.2)
Sodium: 146 mmol/L — ABNORMAL HIGH (ref 134–144)
Total Protein: 6.9 g/dL (ref 6.0–8.5)
eGFR: 59 mL/min/{1.73_m2} — ABNORMAL LOW

## 2025-01-24 LAB — APOE ALZHEIMER'S DISEASE RISK

## 2025-01-24 LAB — CBC WITH DIFFERENTIAL/PLATELET
Basophils Absolute: 0 10*3/uL (ref 0.0–0.2)
Basos: 0 %
EOS (ABSOLUTE): 0.1 10*3/uL (ref 0.0–0.4)
Eos: 2 %
Hematocrit: 39.6 % (ref 37.5–51.0)
Hemoglobin: 13.4 g/dL (ref 13.0–17.7)
Immature Grans (Abs): 0 10*3/uL (ref 0.0–0.1)
Immature Granulocytes: 0 %
Lymphocytes Absolute: 1.6 10*3/uL (ref 0.7–3.1)
Lymphs: 30 %
MCH: 31.6 pg (ref 26.6–33.0)
MCHC: 33.8 g/dL (ref 31.5–35.7)
MCV: 93 fL (ref 79–97)
Monocytes Absolute: 0.4 10*3/uL (ref 0.1–0.9)
Monocytes: 8 %
Neutrophils Absolute: 3.2 10*3/uL (ref 1.4–7.0)
Neutrophils: 60 %
Platelets: 182 10*3/uL (ref 150–450)
RBC: 4.24 x10E6/uL (ref 4.14–5.80)
RDW: 12 % (ref 11.6–15.4)
WBC: 5.3 10*3/uL (ref 3.4–10.8)

## 2025-01-24 LAB — LACOSAMIDE: Lacosamide: 4.7 ug/mL — AB (ref 5.0–10.0)

## 2025-01-24 LAB — ATN PROFILE

## 2025-02-07 ENCOUNTER — Other Ambulatory Visit

## 2025-02-19 ENCOUNTER — Ambulatory Visit: Admitting: Urology

## 2025-07-02 ENCOUNTER — Ambulatory Visit: Admitting: Urology
